# Patient Record
Sex: Female | Born: 1985 | Race: Black or African American | Hispanic: No | Marital: Single | Smoking: Current every day smoker
Health system: Southern US, Community
[De-identification: ages and names within clinical notes are randomized; demographics above are authoritative.]

## PROBLEM LIST (undated history)

## (undated) DIAGNOSIS — R55 Syncope and collapse: Secondary | ICD-10-CM

## (undated) DIAGNOSIS — I10 Essential (primary) hypertension: Secondary | ICD-10-CM

## (undated) DIAGNOSIS — F419 Anxiety disorder, unspecified: Secondary | ICD-10-CM

## (undated) DIAGNOSIS — R569 Unspecified convulsions: Secondary | ICD-10-CM

## (undated) DIAGNOSIS — F329 Major depressive disorder, single episode, unspecified: Secondary | ICD-10-CM

## (undated) DIAGNOSIS — F32A Depression, unspecified: Secondary | ICD-10-CM

## (undated) DIAGNOSIS — K859 Acute pancreatitis without necrosis or infection, unspecified: Secondary | ICD-10-CM

## (undated) DIAGNOSIS — E876 Hypokalemia: Secondary | ICD-10-CM

## (undated) HISTORY — DX: Major depressive disorder, single episode, unspecified: F32.9

## (undated) HISTORY — DX: Unspecified convulsions: R56.9

## (undated) HISTORY — DX: Hypokalemia: E87.6

## (undated) HISTORY — DX: Anxiety disorder, unspecified: F41.9

## (undated) HISTORY — DX: Syncope and collapse: R55

## (undated) HISTORY — DX: Essential (primary) hypertension: I10

## (undated) HISTORY — DX: Depression, unspecified: F32.A

---
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
## 2009-04-14 ENCOUNTER — Emergency Department (HOSPITAL_COMMUNITY): Admission: EM | Admit: 2009-04-14 | Discharge: 2009-04-14 | Payer: Self-pay | Admitting: Emergency Medicine

## 2010-03-03 ENCOUNTER — Emergency Department (HOSPITAL_COMMUNITY)
Admission: EM | Admit: 2010-03-03 | Discharge: 2010-03-03 | Payer: Self-pay | Source: Home / Self Care | Admitting: Emergency Medicine

## 2010-06-18 LAB — POCT I-STAT, CHEM 8
Calcium, Ion: 1.13 mmol/L (ref 1.12–1.32)
Chloride: 105 mEq/L (ref 96–112)
HCT: 42 % (ref 36.0–46.0)
Potassium: 3.7 mEq/L (ref 3.5–5.1)
Sodium: 140 mEq/L (ref 135–145)

## 2010-06-18 LAB — URINE MICROSCOPIC-ADD ON

## 2010-06-18 LAB — DIFFERENTIAL
Basophils Absolute: 0 10*3/uL (ref 0.0–0.1)
Lymphocytes Relative: 16 % (ref 12–46)
Monocytes Absolute: 0.5 10*3/uL (ref 0.1–1.0)
Neutro Abs: 4.1 10*3/uL (ref 1.7–7.7)

## 2010-06-18 LAB — CBC
Hemoglobin: 13.6 g/dL (ref 12.0–15.0)
RDW: 14.4 % (ref 11.5–15.5)

## 2010-06-18 LAB — D-DIMER, QUANTITATIVE: D-Dimer, Quant: <0.22 ug/mL-FEU (ref 0.00–0.48)

## 2010-06-18 LAB — URINALYSIS, ROUTINE W REFLEX MICROSCOPIC
Bilirubin Urine: NEGATIVE
Glucose, UA: NEGATIVE mg/dL
Hgb urine dipstick: NEGATIVE
Ketones, ur: NEGATIVE mg/dL
Protein, ur: 30 mg/dL — AB

## 2010-06-18 LAB — ETHANOL: Alcohol, Ethyl (B): 74 mg/dL — ABNORMAL HIGH (ref 0–10)

## 2010-09-02 ENCOUNTER — Inpatient Hospital Stay (HOSPITAL_COMMUNITY)
Admission: AD | Admit: 2010-09-02 | Discharge: 2010-09-02 | DRG: 780 | Disposition: A | Discharge status: Home / Self Care | Payer: 59 | Source: Ambulatory Visit | Attending: Obstetrics and Gynecology | Admitting: Obstetrics and Gynecology

## 2010-09-02 DIAGNOSIS — O479 False labor, unspecified: Principal | ICD-10-CM | POA: Diagnosis present

## 2010-09-02 LAB — CBC
Hemoglobin: 12.2 g/dL (ref 12.0–15.0)
MCH: 31 pg (ref 26.0–34.0)
RBC: 3.94 MIL/uL (ref 3.87–5.11)

## 2010-09-02 LAB — RPR: RPR Ser Ql: NONREACTIVE

## 2010-09-03 ENCOUNTER — Inpatient Hospital Stay (HOSPITAL_COMMUNITY)
Admission: AD | Admit: 2010-09-03 | Discharge: 2010-09-05 | DRG: 775 | Disposition: A | Discharge status: Home / Self Care | Payer: 59 | Source: Ambulatory Visit | Attending: Obstetrics and Gynecology | Admitting: Obstetrics and Gynecology

## 2010-09-03 DIAGNOSIS — O329XX0 Maternal care for malpresentation of fetus, unspecified, not applicable or unspecified: Secondary | ICD-10-CM | POA: Diagnosis present

## 2010-09-03 LAB — CBC
HCT: 36.9 % (ref 36.0–46.0)
Hemoglobin: 12.8 g/dL (ref 12.0–15.0)
RBC: 4.06 MIL/uL (ref 3.87–5.11)
WBC: 12.8 10*3/uL — ABNORMAL HIGH (ref 4.0–10.5)

## 2010-09-03 LAB — ABO/RH: ABO/RH(D): O POS

## 2010-09-04 LAB — CBC
MCH: 30.6 pg (ref 26.0–34.0)
MCHC: 33.2 g/dL (ref 30.0–36.0)
MCV: 92.1 fL (ref 78.0–100.0)
Platelets: 249 10*3/uL (ref 150–400)
RDW: 13.7 % (ref 11.5–15.5)
WBC: 14.6 10*3/uL — ABNORMAL HIGH (ref 4.0–10.5)

## 2013-12-11 LAB — COMPREHENSIVE METABOLIC PANEL
ALBUMIN: 3.8 g/dL (ref 3.4–5.0)
Alkaline Phosphatase: 65 U/L
Anion Gap: 13 (ref 7–16)
BUN: 8 mg/dL (ref 7–18)
Bilirubin,Total: 0.6 mg/dL (ref 0.2–1.0)
Calcium, Total: 8.7 mg/dL (ref 8.5–10.1)
Chloride: 104 mmol/L (ref 98–107)
Co2: 20 mmol/L — ABNORMAL LOW (ref 21–32)
Creatinine: 0.98 mg/dL (ref 0.60–1.30)
Glucose: 103 mg/dL — ABNORMAL HIGH (ref 65–99)
OSMOLALITY: 272 (ref 275–301)
Potassium: 3 mmol/L — ABNORMAL LOW (ref 3.5–5.1)
SGOT(AST): 40 U/L — ABNORMAL HIGH (ref 15–37)
SGPT (ALT): 23 U/L
Sodium: 137 mmol/L (ref 136–145)
Total Protein: 7.6 g/dL (ref 6.4–8.2)

## 2013-12-11 LAB — URINALYSIS, COMPLETE
BACTERIA: NONE SEEN
BILIRUBIN, UR: NEGATIVE
Glucose,UR: NEGATIVE mg/dL (ref 0–75)
NITRITE: NEGATIVE
PH: 7 (ref 4.5–8.0)
Protein: 100
RBC,UR: 4 /HPF (ref 0–5)
Specific Gravity: 1.027 (ref 1.003–1.030)
Squamous Epithelial: 9
WBC UR: 2 /HPF (ref 0–5)

## 2013-12-11 LAB — TROPONIN I

## 2013-12-11 LAB — CBC WITH DIFFERENTIAL/PLATELET
BASOS ABS: 0.1 10*3/uL (ref 0.0–0.1)
Basophil %: 0.6 %
EOS ABS: 0.1 10*3/uL (ref 0.0–0.7)
EOS PCT: 0.5 %
HCT: 37.9 % (ref 35.0–47.0)
HGB: 12.8 g/dL (ref 12.0–16.0)
Lymphocyte #: 1.2 10*3/uL (ref 1.0–3.6)
Lymphocyte %: 12.1 %
MCH: 34.5 pg — ABNORMAL HIGH (ref 26.0–34.0)
MCHC: 33.6 g/dL (ref 32.0–36.0)
MCV: 103 fL — AB (ref 80–100)
MONOS PCT: 7 %
Monocyte #: 0.7 x10 3/mm (ref 0.2–0.9)
NEUTROS ABS: 8.2 10*3/uL — AB (ref 1.4–6.5)
NEUTROS PCT: 79.8 %
Platelet: 352 10*3/uL (ref 150–440)
RBC: 3.7 10*6/uL — ABNORMAL LOW (ref 3.80–5.20)
RDW: 18.3 % — ABNORMAL HIGH (ref 11.5–14.5)
WBC: 10.3 10*3/uL (ref 3.6–11.0)

## 2013-12-11 LAB — LIPASE, BLOOD: LIPASE: 2719 U/L — AB (ref 73–393)

## 2013-12-12 ENCOUNTER — Inpatient Hospital Stay: Payer: Self-pay | Admitting: Internal Medicine

## 2013-12-13 LAB — CBC WITH DIFFERENTIAL/PLATELET
BASOS PCT: 0.6 %
Basophil #: 0 10*3/uL (ref 0.0–0.1)
EOS PCT: 4.9 %
Eosinophil #: 0.3 10*3/uL (ref 0.0–0.7)
HCT: 30 % — AB (ref 35.0–47.0)
HGB: 9.9 g/dL — AB (ref 12.0–16.0)
LYMPHS ABS: 1.5 10*3/uL (ref 1.0–3.6)
LYMPHS PCT: 26 %
MCH: 34.5 pg — ABNORMAL HIGH (ref 26.0–34.0)
MCHC: 33 g/dL (ref 32.0–36.0)
MCV: 104 fL — ABNORMAL HIGH (ref 80–100)
Monocyte #: 0.5 x10 3/mm (ref 0.2–0.9)
Monocyte %: 9.2 %
Neutrophil #: 3.4 10*3/uL (ref 1.4–6.5)
Neutrophil %: 59.3 %
Platelet: 235 10*3/uL (ref 150–440)
RBC: 2.88 10*6/uL — AB (ref 3.80–5.20)
RDW: 18.3 % — ABNORMAL HIGH (ref 11.5–14.5)
WBC: 5.7 10*3/uL (ref 3.6–11.0)

## 2013-12-13 LAB — BASIC METABOLIC PANEL
Anion Gap: 7 (ref 7–16)
BUN: 4 mg/dL — AB (ref 7–18)
CO2: 26 mmol/L (ref 21–32)
CREATININE: 0.84 mg/dL (ref 0.60–1.30)
Calcium, Total: 7.3 mg/dL — ABNORMAL LOW (ref 8.5–10.1)
Chloride: 105 mmol/L (ref 98–107)
EGFR (African American): >60
EGFR (Non-African Amer.): >60
Glucose: 75 mg/dL (ref 65–99)
OSMOLALITY: 271 (ref 275–301)
POTASSIUM: 3.2 mmol/L — AB (ref 3.5–5.1)
SODIUM: 138 mmol/L (ref 136–145)

## 2013-12-13 LAB — MAGNESIUM: Magnesium: 1.5 mg/dL — ABNORMAL LOW

## 2013-12-13 LAB — LIPASE, BLOOD: Lipase: 534 U/L — ABNORMAL HIGH (ref 73–393)

## 2014-07-24 NOTE — H&P (Signed)
PATIENT NAME:  Melissa Chapman, Melissa Chapman MR#:  [id number] DATE OF BIRTH:  14-Jun-1985  DATE OF ADMISSION:  12/12/2013  REFERRING PHYSICIAN: [place] SinkJade J. Dolores FrameSung, MD  PRIMARY CARE PHYSICIAN: None.   CHIEF COMPLAINT: Abdominal pain and vomiting.   HISTORY OF PRESENT ILLNESS: This is a 29 year old female with a significant past medical history of depression, presents with complaints of epigastric pain and vomiting over the last 24 hours. Reports she was at her usual state of health prior to that; only had complaints of diarrhea going on for 1 week, which has been resolved. Reports she has been having epigastric pain accompanied by nausea and vomiting and loss of appetite over the last 24 hours. Denies any chest pain, fevers, chills, bright red blood per rectum, coffee-ground emesis, dysuria, or polyuria. In the ED, the patient had basic workup done including lipase, which was significantly elevated at 2719. The patient admits to drinking beer on a daily basis, 2 to 3 beers per day. The patient had an ultrasound of the abdomen which did show a distended gallbladder but no findings of acute cholecystitis with suspicion of adenomyomatosis. Hospitalists were requested to admit the patient for treatment of her pancreatitis.   PAST MEDICAL HISTORY: Depression.   SOCIAL HISTORY: The patient works as a Engineer, civil (consulting)nurse. Drinks 2-3 beers per day. Smokes 1/2 pack of cigarettes per day. No illicit drug use.  FAMILY HISTORY: Significant for hypertension and diabetes.   ALLERGIES: No known drug allergies.   HOME MEDICATIONS: Zoloft 50 mg oral daily.   REVIEW OF SYSTEMS:  CONSTITUTIONAL: The patient denies fever, chills, fatigue, weakness, weight gain, weight loss.  EYES: Denies blurry vision, double vision, inflammation.  ENT: Denies tinnitus, ear pain, hearing loss, epistaxis.  RESPIRATORY: Denies cough, wheezing, hemoptysis, COPD.  CARDIOVASCULAR: Denies chest pain, edema, palpitations, syncope.  GASTROINTESTINAL: Reports nausea,  vomiting, abdominal pain, diarrhea. Denies hematemesis, melena, jaundice, rectal bleed.  GENITOURINARY: Denies dysuria, hematuria, or renal colic.  ENDOCRINE: Denies polyuria, polydipsia, heat or cold intolerance.  HEMATOLOGY: Denies anemia, easy bruising, bleeding diatheses.  INTEGUMENT: Denies acne, rash, or skin lesions.  MUSCULOSKELETAL: Denies any swelling, gout, cramps.  NEUROLOGIC: Denies CVA, TIA, tremors, vertigo, ataxia.  PSYCHIATRIC: Denies anxiety, insomnia, or depression.   PHYSICAL EXAMINATION: VITAL SIGNS: Temperature 99.2, pulse 92, respiratory rate 16, blood pressure 173/121, saturating 100% on room air.  GENERAL: Well-nourished female who looks comfortable in bed, in no apparent distress.  HEENT: Head atraumatic, normocephalic. Pupils equal, reactive to light. Pink conjunctivae. Anicteric sclerae. Moist oral mucosa.  NECK: Supple. No thyromegaly. No JVD. No carotid bruits.  CHEST: Good air entry bilaterally. No wheezing, rales, rhonchi. No use of accessory muscles.  CARDIOVASCULAR: S1, S2 heard. No rubs, murmur, or gallops. PMI nondisplaced.  ABDOMEN: Soft. Mild tenderness to palpation in the epigastric area. No rebound. No guarding. Bowel sounds present. No organomegaly appreciated.  EXTREMITIES: No edema. No clubbing. No cyanosis. Pedal pulses +2 bilaterally.  PSYCHIATRIC: Appropriate affect. Awake, alert x 3. Intact judgment and insight.  NEUROLOGIC: Cranial nerves grossly intact. Motor 5/5. No focal deficits. Sensation symmetrical and intact to light touch.  MUSCULOSKELETAL: No joint effusion or erythema.  SKIN: Normal skin turgor. Warm and dry.   PERTINENT LABORATORY DATA: Glucose 103, BUN 8, creatinine 0.98, sodium 137, potassium 3, chloride 104, CO2 is 20. Lipase 2719. ALT 23, AST 40, alkaline phosphatase 65. White blood cells 10.3, hemoglobin 12.8, hematocrit 37.9, platelets 352,000.   ASSESSMENT AND PLAN:  1.  Acute pancreatitis. This is alcohol-induced acute  pancreatitis.  The patient will be admitted and will be kept on aggressive intravenous fluid hydration. We will keep her n.p.o. for bowel rest. We will have her on as needed nausea and pain medicine. Once her vomiting subsides, we will start her on a clear liquid diet and advance as tolerated. The patient was counseled about alcohol abuse.  2.  Alcohol abuse. The patient will be started on [facility] Withdrawal [facility]  protocol.  3.  Hypokalemia secondary to vomiting. We will replace. We will monitor closely.  4.  Tobacco abuse. The patient was counseled. At this point, she does not want nicotine patch.  5.  Uncontrolled blood pressure. This is most likely related to her vomiting and pain. We will keep her on p.r.n. hydralazine as needed.  6.  Depression. Continue with Zoloft.  7.  Deep vein thrombosis prophylaxis. Subcutaneous heparin.   CODE STATUS: Full code.   TOTAL TIME SPENT ON ADMISSION AND PATIENT CARE: 50 minutes.    ____________________________ Starleen Arms, MD dse:ts D: 12/12/2013 02:42:21 ET T: 12/12/2013 04:44:39 ET JOB#: [id number]  cc: Starleen Arms, MD, <Dictator> Matia Zelada Teena Irani MD ELECTRONICALLY SIGNED 12/12/2013 23:48

## 2014-07-24 NOTE — Discharge Summary (Signed)
PATIENT NAME:  Melissa Chapman, Melissa Chapman MR#:  [id number] DATE OF BIRTH:  02-13-1986  DATE OF ADMISSION:  12/12/2013 DATE OF DISCHARGE:  12/13/2013  DISCHARGE DIAGNOSES:  1.  Acute pancreatitis due to alcohol.  2.  Alcohol abuse.  3.  Tobacco abuse.  4.  Macrocytic anemia.   CONSULTATIONS: None.   PROCEDURES: Abdominal ultrasound shows distended gallbladder. No acute cholecystitis. Normal caliber common bile duct. Normal liver.   BRIEF HISTORY AND PHYSICAL: This 29 year old female with significant past medical history of depression presents with complaint of epigastric pain and vomiting over the past 24 hours. She denies fevers, chills, hematemesis, or hematochezia. Emergency Room evaluation found lipase to be elevated at 2719. The patient drinks 2-3 beers per day. She is admitted for pancreatitis.   HOSPITAL COURSE BY PROBLEM:  1.  Pancreatitis due to alcohol: The patient was placed on bowel rest with IV hydration. Pain was controlled with IV morphine. Within 24 hours she had no pain. She tolerated several meals. Lipase decreased from the 2719 to 534.  2.  Alcohol abuse: Likely causing acute pancreatitis. She showed no signs of withdrawal during this admission. She was on the CIWA protocol. We discussed alcohol cessation. The patient feels that she will be able to quit drinking. She was advised to find a local AA program.  3.  Tobacco abuse: Cessation counseling provided inpatient. She should ask your primary care provider for a prescription for nicotine patches.  4.  Macrocytic anemia: This is likely due to a combination of heavy alcohol abuse and iron loss through menstruation. This should be followed up in the outpatient setting.   DISCHARGE PHYSICAL EXAMINATION: VITAL SIGNS: Temperature 98, pulse 98, respirations 16, blood pressure 155/103, oxygen saturation 95% on room air.  GENERAL: No acute distress.  RESPIRATORY: Lungs are clear to auscultation bilaterally with good air movement.   CARDIOVASCULAR: Regular rate and rhythm. No murmurs, rubs, or gallops.  ABDOMEN: Soft, nontender. Bowel sounds are normal. No guarding, no rebound, no hepatosplenomegaly.  EXTREMITIES: No edema, 2+ peripheral pulses.  NEUROLOGIC: Cranial nerves II to XII are intact. Strength and sensation are normal.  PSYCHIATRIC: The patient is calm, alert, oriented with good insight.   LABORATORY DATA: Sodium 134, potassium 3.2 (this was repleted with 40 mEq of potassium before discharge), chloride 105, bicarbonate 26, BUN 4, creatinine at 0.84. Lipase 534. LFTs normal. Hemoglobin 9.9, white blood cells 5.7, platelets 235,000, MCV 104.   DISCHARGE MEDICATIONS: Zoloft 50 mg 1 tablet daily.   CONDITION ON DISCHARGE: Stable.   DISPOSITION: She is discharged to home with no home health needs.   DISCHARGE INSTRUCTIONS: Please find a primary care provider and follow up within 1-2 weeks of discharge.   DIET: Advance as tolerated.   ACTIVITY: As tolerated.   TIME SPENT ON DISCHARGE: 35 minutes.    ____________________________ Ena Dawleyatherine P. Clent RidgesWalsh, MD cpw:ts D: 12/13/2013 15:12:28 ET T: 12/13/2013 17:35:00 ET JOB#: [id number]  cc: Santina Evansatherine P. Clent RidgesWalsh, MD, <Dictator> Gale JourneyATHERINE P Cristian Davitt MD ELECTRONICALLY SIGNED 12/16/2013 11:15

## 2015-11-11 ENCOUNTER — Encounter (HOSPITAL_COMMUNITY): Payer: Self-pay | Admitting: Emergency Medicine

## 2015-11-11 ENCOUNTER — Emergency Department (HOSPITAL_COMMUNITY)
Admission: EM | Admit: 2015-11-11 | Discharge: 2015-11-12 | Disposition: A | Discharge status: Home / Self Care | Payer: 59 | Attending: Emergency Medicine | Admitting: Emergency Medicine

## 2015-11-11 DIAGNOSIS — K859 Acute pancreatitis without necrosis or infection, unspecified: Secondary | ICD-10-CM | POA: Insufficient documentation

## 2015-11-11 DIAGNOSIS — Z791 Long term (current) use of non-steroidal anti-inflammatories (NSAID): Secondary | ICD-10-CM | POA: Insufficient documentation

## 2015-11-11 HISTORY — DX: Acute pancreatitis without necrosis or infection, unspecified: K85.90

## 2015-11-11 MED ORDER — ONDANSETRON HCL 4 MG/2ML IJ SOLN
4.0000 mg | Freq: Once | INTRAMUSCULAR | Status: AC
  Administered 2015-11-11: 4 mg via INTRAVENOUS
  Filled 2015-11-11: qty 2

## 2015-11-11 MED ORDER — HYDROMORPHONE HCL 1 MG/ML IJ SOLN
1.0000 mg | Freq: Once | INTRAMUSCULAR | Status: AC
  Administered 2015-11-11: 1 mg via INTRAVENOUS
  Filled 2015-11-11: qty 1

## 2015-11-11 MED ORDER — SODIUM CHLORIDE 0.9 % IV BOLUS (SEPSIS)
2000.0000 mL | Freq: Once | INTRAVENOUS | Status: AC
  Administered 2015-11-11: 2000 mL via INTRAVENOUS

## 2015-11-11 NOTE — ED Provider Notes (Signed)
[facility]-EMERGENCY DEPT Provider Note   CSN: [id number] Arrival date & time: 11/11/15  2302  First Provider Contact:  First MD Initiated Contact with Patient 11/11/15 2326        History   Chief Complaint Chief Complaint  Patient presents with  . Abdominal Pain    HPI Melissa Chapman is a 30 y.o. female.  Patient with a history of pancreatitis, recent alcohol use, presents with 2 day onset of progressively worsening Left upper quadrant abdominal pain that wraps around to the back, between shoulder blades. She started having nausea and vomiting yesterday. She reports a history of pancreatitis and continued alcohol use, last use 3 days ago. Not currently intoxicated   The history is provided by the patient. No language interpreter was used.  Abdominal Pain   This is a new problem. The current episode started 2 days ago. The problem occurs constantly. The problem has been gradually worsening. The pain is associated with alcohol use. The pain is located in the LUQ. Associated symptoms include nausea and vomiting. Pertinent negatives include fever and dysuria.    Past Medical History:  Diagnosis Date  . Pancreatitis     There are no active problems to display for this patient.   History reviewed. No pertinent surgical history.  OB History    No data available       Home Medications    Prior to Admission medications   Not on File    Family History No family history on file.  Social History Social History  Substance Use Topics  . Smoking status: Never Smoker  . Smokeless tobacco: Never Used  . Alcohol use Yes     Allergies   Review of patient's allergies indicates no known allergies.   Review of Systems Review of Systems  Constitutional: Negative for chills and fever.  Respiratory: Negative.  Negative for shortness of breath.   Cardiovascular: Negative.  Negative for chest pain.  Gastrointestinal: Positive for abdominal pain, nausea and vomiting.    Genitourinary: Negative.  Negative for dysuria, flank pain and vaginal discharge.  Musculoskeletal: Positive for back pain.  Skin: Negative.   Neurological: Negative.      Physical Exam Updated Vital Signs BP (!) 160/117 (BP Location: Left Arm)   Pulse (!) 143   Temp 98 F (36.7 C) (Oral)   Resp 18   SpO2 99%   Physical Exam  Constitutional: She is oriented to person, place, and time. She appears well-developed and well-nourished.  Uncomfortable appearing.  HENT:  Head: Normocephalic.  Neck: Normal range of motion. Neck supple.  Cardiovascular: Regular rhythm.  Tachycardia present.   Pulmonary/Chest: Effort normal and breath sounds normal.  Abdominal: Soft. Bowel sounds are normal. There is tenderness (epigastric and LUQ tenderness with guarding.). There is guarding. There is no rebound.  Musculoskeletal: Normal range of motion.  Neurological: She is alert and oriented to person, place, and time.  Skin: Skin is warm and dry. No rash noted.  Psychiatric: She has a normal mood and affect.     ED Treatments / Results  Labs (all labs ordered are listed, but only abnormal results are displayed) Labs Reviewed  CBC WITH DIFFERENTIAL/PLATELET  LIPASE, BLOOD  COMPREHENSIVE METABOLIC PANEL  PREGNANCY, URINE  URINALYSIS, ROUTINE W REFLEX MICROSCOPIC (NOT AT [facility])   Results for orders placed or performed during the hospital encounter of 11/11/15  CBC with Differential  Result Value Ref Range   WBC 9.7 4.0 - 10.5 K/uL   RBC 3.47 (L) 3.87 -  5.11 MIL/uL   Hemoglobin 12.3 12.0 - 15.0 g/dL   HCT 16.1 (L) 09.6 - 04.5 %   MCV 100.0 78.0 - 100.0 fL   MCH 35.4 (H) 26.0 - 34.0 pg   MCHC 35.4 30.0 - 36.0 g/dL   RDW 40.9 (H) 81.1 - 91.4 %   Platelets 228 150 - 400 K/uL   Neutrophils Relative % 82 %   Neutro Abs 7.9 (H) 1.7 - 7.7 K/uL   Lymphocytes Relative 12 %   Lymphs Abs 1.2 0.7 - 4.0 K/uL   Monocytes Relative 6 %   Monocytes Absolute 0.6 0.1 - 1.0 K/uL   Eosinophils Relative  0 %   Eosinophils Absolute 0.0 0.0 - 0.7 K/uL   Basophils Relative 0 %   Basophils Absolute 0.0 0.0 - 0.1 K/uL  Lipase, blood  Result Value Ref Range   Lipase 262 (H) 11 - 51 U/L  Comprehensive metabolic panel  Result Value Ref Range   Sodium 138 135 - 145 mmol/L   Potassium 3.0 (L) 3.5 - 5.1 mmol/L   Chloride 101 101 - 111 mmol/L   CO2 24 22 - 32 mmol/L   Glucose, Bld 174 (H) 65 - 99 mg/dL   BUN 9 6 - 20 mg/dL   Creatinine, Ser 7.82 0.44 - 1.00 mg/dL   Calcium 8.6 (L) 8.9 - 10.3 mg/dL   Total Protein 7.7 6.5 - 8.1 g/dL   Albumin 3.7 3.5 - 5.0 g/dL   AST 41 15 - 41 U/L   ALT 21 14 - 54 U/L   Alkaline Phosphatase 93 38 - 126 U/L   Total Bilirubin 2.5 (H) 0.3 - 1.2 mg/dL   GFR calc non Af Amer >60 >60 mL/min   GFR calc Af Amer >60 >60 mL/min   Anion gap 13 5 - 15  Pregnancy, urine  Result Value Ref Range   Preg Test, Ur NEGATIVE NEGATIVE  Urinalysis, Routine w reflex microscopic  Result Value Ref Range   Color, Urine ORANGE (A) YELLOW   APPearance CLOUDY (A) CLEAR   Specific Gravity, Urine 1.026 1.005 - 1.030   pH 6.0 5.0 - 8.0   Glucose, UA NEGATIVE NEGATIVE mg/dL   Hgb urine dipstick SMALL (A) NEGATIVE   Bilirubin Urine SMALL (A) NEGATIVE   Ketones, ur 15 (A) NEGATIVE mg/dL   Protein, ur 956 (A) NEGATIVE mg/dL   Nitrite NEGATIVE NEGATIVE   Leukocytes, UA SMALL (A) NEGATIVE  Urine microscopic-add on  Result Value Ref Range   Squamous Epithelial / LPF 6-30 (A) NONE SEEN   WBC, UA 6-30 0 - 5 WBC/hpf   RBC / HPF 6-30 0 - 5 RBC/hpf   Bacteria, UA FEW (A) NONE SEEN   Trichomonas, UA PRESENT    Urine-Other MUCOUS PRESENT     EKG  EKG Interpretation None       Radiology No results found.  Procedures Procedures (including critical care time)  Medications Ordered in ED Medications  sodium chloride 0.9 % bolus 2,000 mL (not administered)     Initial Impression / Assessment and Plan / ED Course  I have reviewed the triage vital signs and the nursing  notes.  Pertinent labs & imaging results that were available during my care of the patient were reviewed by me and considered in my medical decision making (see chart for details).  Clinical Course    Patient presents with LUQ abdominal pain similar to previous pancreatitis. No fever. She admits to alcohol use 2 days ago. Pain  is controlled and she is tolerating PO fluids without vomiting. She appears more comfortable.   Labs show a mildly elevated lipase of 262. Elevated total bilirubin without other liver abnormalities. No fever. Tachycardia improved with fluids. She is stable for discharge home. Return precautions discussed.   Final Clinical Impressions(s) / ED Diagnoses   Final diagnoses:  None   1. Pancreatitis, mild 2. Hyperbilirubinemia.  New Prescriptions New Prescriptions   No medications on file     Elpidio Anis, PA-C 11/12/15 0438    Mancel Bale, MD 11/12/15 [phone]

## 2015-11-11 NOTE — ED Triage Notes (Signed)
Pt presents with LUQ pain and pain to L flank x 2 days, n/v onset yesterday. Denies fever or diarrhea. Pt states she has had similar pain 2958yrs ago with pancreatitis.

## 2015-11-11 NOTE — ED Notes (Signed)
Pt is aware of the need for urine. 

## 2015-11-12 LAB — COMPREHENSIVE METABOLIC PANEL
ALBUMIN: 3.7 g/dL (ref 3.5–5.0)
ALK PHOS: 93 U/L (ref 38–126)
ALT: 21 U/L (ref 14–54)
AST: 41 U/L (ref 15–41)
Anion gap: 13 (ref 5–15)
BUN: 9 mg/dL (ref 6–20)
CALCIUM: 8.6 mg/dL — AB (ref 8.9–10.3)
CO2: 24 mmol/L (ref 22–32)
CREATININE: 1 mg/dL (ref 0.44–1.00)
Chloride: 101 mmol/L (ref 101–111)
GFR calc Af Amer: >60 mL/min (ref >60)
GFR calc non Af Amer: >60 mL/min (ref >60)
GLUCOSE: 174 mg/dL — AB (ref 65–99)
Potassium: 3 mmol/L — ABNORMAL LOW (ref 3.5–5.1)
SODIUM: 138 mmol/L (ref 135–145)
Total Bilirubin: 2.5 mg/dL — ABNORMAL HIGH (ref 0.3–1.2)
Total Protein: 7.7 g/dL (ref 6.5–8.1)

## 2015-11-12 LAB — URINALYSIS, ROUTINE W REFLEX MICROSCOPIC
GLUCOSE, UA: NEGATIVE mg/dL
Ketones, ur: 15 mg/dL — AB
Nitrite: NEGATIVE
PH: 6 (ref 5.0–8.0)
Protein, ur: 100 mg/dL — AB
SPECIFIC GRAVITY, URINE: 1.026 (ref 1.005–1.030)

## 2015-11-12 LAB — PREGNANCY, URINE: Preg Test, Ur: NEGATIVE

## 2015-11-12 LAB — CBC WITH DIFFERENTIAL/PLATELET
BASOS ABS: 0 10*3/uL (ref 0.0–0.1)
Basophils Relative: 0 %
Eosinophils Absolute: 0 10*3/uL (ref 0.0–0.7)
Eosinophils Relative: 0 %
HEMATOCRIT: 34.7 % — AB (ref 36.0–46.0)
Hemoglobin: 12.3 g/dL (ref 12.0–15.0)
LYMPHS ABS: 1.2 10*3/uL (ref 0.7–4.0)
LYMPHS PCT: 12 %
MCH: 35.4 pg — ABNORMAL HIGH (ref 26.0–34.0)
MCHC: 35.4 g/dL (ref 30.0–36.0)
MCV: 100 fL (ref 78.0–100.0)
MONO ABS: 0.6 10*3/uL (ref 0.1–1.0)
Monocytes Relative: 6 %
NEUTROS ABS: 7.9 10*3/uL — AB (ref 1.7–7.7)
Neutrophils Relative %: 82 %
Platelets: 228 10*3/uL (ref 150–400)
RBC: 3.47 MIL/uL — ABNORMAL LOW (ref 3.87–5.11)
RDW: 16.6 % — ABNORMAL HIGH (ref 11.5–15.5)
WBC: 9.7 10*3/uL (ref 4.0–10.5)

## 2015-11-12 LAB — URINE MICROSCOPIC-ADD ON

## 2015-11-12 LAB — LIPASE, BLOOD: Lipase: 262 U/L — ABNORMAL HIGH (ref 11–51)

## 2015-11-12 MED ORDER — HYDROMORPHONE HCL 1 MG/ML IJ SOLN
1.0000 mg | Freq: Once | INTRAMUSCULAR | Status: AC
  Administered 2015-11-12: 1 mg via INTRAVENOUS
  Filled 2015-11-12: qty 1

## 2015-11-12 MED ORDER — SODIUM CHLORIDE 0.9 % IV BOLUS (SEPSIS)
1000.0000 mL | Freq: Once | INTRAVENOUS | Status: AC
  Administered 2015-11-12: 1000 mL via INTRAVENOUS

## 2015-11-12 MED ORDER — OXYCODONE-ACETAMINOPHEN 5-325 MG PO TABS: 1.0000 | ORAL_TABLET | ORAL | 0 refills | Status: DC | PRN

## 2015-11-12 MED ORDER — HYDROMORPHONE HCL 1 MG/ML IJ SOLN
0.5000 mg | Freq: Once | INTRAMUSCULAR | Status: AC
Start: 2015-11-12 — End: 2015-11-12
  Administered 2015-11-12: 0.5 mg via INTRAVENOUS
  Filled 2015-11-12: qty 1

## 2015-11-12 MED ORDER — KETOROLAC TROMETHAMINE 30 MG/ML IJ SOLN
30.0000 mg | Freq: Once | INTRAMUSCULAR | Status: AC
  Administered 2015-11-12: 30 mg via INTRAVENOUS
  Filled 2015-11-12: qty 1

## 2015-11-12 MED ORDER — POTASSIUM CHLORIDE 10 MEQ/100ML IV SOLN
10.0000 meq | Freq: Once | INTRAVENOUS | Status: AC
  Administered 2015-11-12: 10 meq via INTRAVENOUS
  Filled 2015-11-12: qty 100

## 2015-11-12 MED ORDER — ONDANSETRON 8 MG PO TBDP: 8.0000 mg | ORAL_TABLET | Freq: Three times a day (TID) | ORAL | 0 refills | Status: DC | PRN

## 2015-11-12 MED ORDER — OXYCODONE-ACETAMINOPHEN 5-325 MG PO TABS
2.0000 | ORAL_TABLET | Freq: Once | ORAL | Status: AC
Start: 2015-11-12 — End: 2015-11-12
  Administered 2015-11-12: 2 via ORAL
  Filled 2015-11-12: qty 2

## 2015-11-12 NOTE — ED Notes (Signed)
Patient d/c'd self care.  F/U and medication discussed.  Patient verbalized understanding. 

## 2015-11-12 NOTE — ED Notes (Addendum)
Pt. Ambulated down the hall and back to her room, pt. 98% room air and heart rate 138. Pt. Gait steady on her feet. PA,Shari made aware of elevated heart rate.

## 2015-11-12 NOTE — Discharge Instructions (Signed)
RETURN TO THE EMERGENCY DEPARTMENT WITH ANY UNCONTROLLED SEVERE PAIN OR VOMITING, FEVER OR NEW CONCERN.

## 2016-09-12 MED FILL — AMLODIPINE BESYLATE 10 MG T: 10 | 90 days supply | Qty: 90 | Fill #0

## 2016-10-22 ENCOUNTER — Ambulatory Visit (INDEPENDENT_AMBULATORY_CARE_PROVIDER_SITE_OTHER): Payer: 59 | Admitting: Primary Care

## 2016-10-22 ENCOUNTER — Encounter: Payer: Self-pay | Admitting: Primary Care

## 2016-10-22 VITALS — BP 144/100 | HR 108 | Temp 98.6°F | Ht 67.0 in | Wt 158.8 lb

## 2016-10-22 DIAGNOSIS — I10 Essential (primary) hypertension: Secondary | ICD-10-CM | POA: Insufficient documentation

## 2016-10-22 DIAGNOSIS — F419 Anxiety disorder, unspecified: Secondary | ICD-10-CM | POA: Diagnosis not present

## 2016-10-22 DIAGNOSIS — F329 Major depressive disorder, single episode, unspecified: Secondary | ICD-10-CM | POA: Diagnosis not present

## 2016-10-22 MED ORDER — HYDROCHLOROTHIAZIDE 25 MG PO TABS: 25.0000 mg | ORAL_TABLET | Freq: Every day | ORAL | 0 refills | Status: DC

## 2016-10-22 MED ORDER — ESCITALOPRAM OXALATE 10 MG PO TABS: 10.0000 mg | ORAL_TABLET | Freq: Every day | ORAL | 1 refills | Status: DC

## 2016-10-22 MED FILL — HYDROCHLOROTHIAZIDE 25 MG T: 25 | 30 days supply | Qty: 30 | Fill #0

## 2016-10-22 MED FILL — ESCITALOPRAM 10 MG TABLET: 10 | 30 days supply | Qty: 30 | Fill #0

## 2016-10-22 NOTE — Assessment & Plan Note (Signed)
GAD 7 score of 13 and PHQ 9 score of 12 today. Denies SI/HI. Failed treatment on Zoloft. Discussed options for treatment and will initiate Lexapro 10 mg.   Patient is to take 1/2 tablet daily for 8 days, then advance to 1 full tablet thereafter. We discussed possible side effects of headache, GI upset, drowsiness, and SI/HI. If thoughts of SI/HI develop, we discussed to present to the emergency immediately. Patient verbalized understanding.   Follow up in 6 weeks for re-evaluation.

## 2016-10-22 NOTE — Assessment & Plan Note (Signed)
Above goal today, even on recheck. Above goal with home readings. Add HCTZ 25 mg, continue Amlodipine 10 mg. Will have her monitor BP for the next 2 weeks, follow up in 2 weeks with home readings. BMP in 2 weeks.

## 2016-10-22 NOTE — Progress Notes (Signed)
Subjective:    Patient ID: Melissa Chapman, female    DOB: [birth year], 31 y.o.   MRN: [id number]  HPI  Melissa Chapman is a 31 year old female who presents today to establish care and discuss the problems mentioned below. Will obtain old records.  1) Essential Hypertension: Currently managed on amlodipine 10 mg. Her BP in the office today is 158/102. She checked her BP three weeks ago which was 145/90. She is compliant to her Amlodipine.   2) Depression: Diagnosed three years ago, symptoms present much longer. Previously managed on Zoloft 50 mg which she took for 6 months, last took this in August 2017. She didn't feel like the Zoloft helped at all. She's experiencing intermittent symptoms of anxiety, lack of motivation, worry. She would like to try something else for anxiety and depression. GAD 7 score of 13 and PHQ 9 score 12 today. She denies SI/HI.  Review of Systems  Constitutional: Positive for fatigue.  Eyes: Negative for visual disturbance.  Respiratory: Negative for shortness of breath.   Cardiovascular: Negative for chest pain.  Neurological: Negative for dizziness and headaches.  Psychiatric/Behavioral:       See HPI       Past Medical History:  Diagnosis Date  . Anxiety and depression   . Essential hypertension   . Pancreatitis      Social History   Social History  . Marital status: Single    Spouse name: N/A  . Number of children: N/A  . Years of education: N/A   Occupational History  . Not on file.   Social History Main Topics  . Smoking status: Never Smoker  . Smokeless tobacco: Never Used  . Alcohol use Yes  . Drug use: No  . Sexual activity: Not on file   Other Topics Concern  . Not on file   Social History Narrative   Single.    1 son.   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    No past surgical history on file.  Family History  Problem Relation Age of Onset  . Depression Mother   . Hypertension Mother   .  Hypertension Father   . Diabetes Father   . Depression Sister   . Cancer Paternal Grandmother        Sinus     No Known Allergies  Current Outpatient Prescriptions on File Prior to Visit  Medication Sig Dispense Refill  . B Complex-C (B-COMPLEX WITH VITAMIN C) tablet Take 1 tablet by mouth daily.    . Biotin 1000 MCG tablet Take 1,000 mcg by mouth daily.    Marland Kitchen. ibuprofen (ADVIL,MOTRIN) 200 MG tablet Take 200-400 mg by mouth every 6 (six) hours as needed (for pain.).    Marland Kitchen. Melatonin 1 MG TABS Take 1-3 mg by mouth at bedtime.     No current facility-administered medications on file prior to visit.     BP (!) 144/100   Pulse (!) 108   Temp 98.6 F (37 C) (Oral)   Ht 5\' 7"  (1.702 m)   Wt 158 lb 12.8 oz (72 kg)   SpO2 98%   BMI 24.87 kg/m    Objective:   Physical Exam  Constitutional: She appears well-nourished.  Neck: Neck supple.  Cardiovascular: Normal rate and regular rhythm.   Pulmonary/Chest: Effort normal and breath sounds normal.  Skin: Skin is warm and dry.  Psychiatric: She has a normal mood and affect.  Assessment & Plan:

## 2016-10-22 NOTE — Patient Instructions (Signed)
Start hydrochlorothiazide 25 mg tablets for high blood pressure. Take 1 tablet by mouth once daily.  Continue Amlodipine 10 mg tablets for high blood pressure.   Check your blood pressure daily, around the same time of day, for the next 2 weeks.  Ensure that you have rested for 30 minutes prior to checking your blood pressure. Record your readings and bring them to your next visit.  Start Lexapro 10 mg tablets. Start by taking 1/2 tablet daily for 8 days, then increase to 1 full tablet thereafter.   Schedule a follow up visit for blood pressure check in 2 weeks.  Schedule a follow up visit for anxiety and depression in 6 weeks.  It was a pleasure to meet you today! Please don't hesitate to call me with any questions. Welcome [facility]!

## 2016-11-05 ENCOUNTER — Encounter: Payer: Self-pay | Admitting: Primary Care

## 2016-11-05 ENCOUNTER — Ambulatory Visit (INDEPENDENT_AMBULATORY_CARE_PROVIDER_SITE_OTHER): Payer: 59 | Admitting: Primary Care

## 2016-11-05 DIAGNOSIS — I10 Essential (primary) hypertension: Secondary | ICD-10-CM

## 2016-11-05 MED ORDER — AMLODIPINE BESYLATE 10 MG PO TABS: 10.0000 mg | ORAL_TABLET | Freq: Every day | ORAL | 2 refills | Status: DC

## 2016-11-05 MED ORDER — HYDROCHLOROTHIAZIDE 25 MG PO TABS: 25.0000 mg | ORAL_TABLET | Freq: Every day | ORAL | 2 refills | Status: DC

## 2016-11-05 NOTE — Progress Notes (Signed)
   Subjective:    Patient ID: Melissa Chapman, female    DOB: 08/28/1985, 31 y.o.   MRN: [id number]  HPI  Melissa Chapman is a 31 year old female who presents today for follow up of hypertension. Currently managed on Amlodipine 10 mg and HCTZ 25 mg that as added during her last visit given persistent elevated readings.   Her BP in the office today is 124/82. She's checking her BP at home and is getting readings of 120's-low 130's/70's-80's. She denies chest pain, dizziness, ankle edema, visual changes. She is needing refills today.  Review of Systems  Constitutional: Negative for fatigue.  Eyes: Negative for visual disturbance.  Respiratory: Negative for shortness of breath.   Cardiovascular: Negative for chest pain and leg swelling.  Neurological: Negative for headaches.       Past Medical History:  Diagnosis Date  . Anxiety and depression   . Essential hypertension   . Pancreatitis      Social History   Social History  . Marital status: Single    Spouse name: N/A  . Number of children: N/A  . Years of education: N/A   Occupational History  . Not on file.   Social History Main Topics  . Smoking status: Never Smoker  . Smokeless tobacco: Never Used  . Alcohol use Yes  . Drug use: No  . Sexual activity: Not on file   Other Topics Concern  . Not on file   Social History Narrative   Single.    1 son.   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    No past surgical history on file.  Family History  Problem Relation Age of Onset  . Depression Mother   . Hypertension Mother   . Hypertension Father   . Diabetes Father   . Depression Sister   . Cancer Paternal Grandmother        Sinus     No Known Allergies  Current Outpatient Prescriptions on File Prior to Visit  Medication Sig Dispense Refill  . B Complex-C (B-COMPLEX WITH VITAMIN C) tablet Take 1 tablet by mouth daily.    . Biotin 1000 MCG tablet Take 1,000 mcg by mouth daily.      Marland Kitchen. escitalopram (LEXAPRO) 10 MG tablet Take 1 tablet (10 mg total) by mouth daily. 30 tablet 1  . ibuprofen (ADVIL,MOTRIN) 200 MG tablet Take 200-400 mg by mouth every 6 (six) hours as needed (for pain.).    Marland Kitchen. Melatonin 1 MG TABS Take 1-3 mg by mouth at bedtime.     No current facility-administered medications on file prior to visit.     BP 124/82   Pulse (!) 104   Temp 98.1 F (36.7 C) (Oral)   Ht 5\' 7"  (1.702 m)   Wt 158 lb 12.8 oz (72 kg)   SpO2 98%   BMI 24.87 kg/m    Objective:   Physical Exam  Constitutional: She appears well-nourished.  Neck: Neck supple.  Cardiovascular: Normal rate and regular rhythm.   Pulmonary/Chest: Effort normal and breath sounds normal.  Skin: Skin is warm and dry.          Assessment & Plan:

## 2016-11-05 NOTE — Patient Instructions (Signed)
Complete lab work prior to leaving today. I will notify you of your results once received.   Continue Amlodipine 10 mg and Hydrochlorothiazide 25 mg for blood pressure. I sent refills to your pharmacy.  We will see you in about 1 month. It was a pleasure to see you today!   DASH Eating Plan DASH stands for "Dietary Approaches to Stop Hypertension." The DASH eating plan is a healthy eating plan that has been shown to reduce high blood pressure (hypertension). It may also reduce your risk for type 2 diabetes, heart disease, and stroke. The DASH eating plan may also help with weight loss. What are tips for following this plan? General guidelines  Avoid eating more than 2,300 mg (milligrams) of salt (sodium) a day. If you have hypertension, you may need to reduce your sodium intake to 1,500 mg a day.  Limit alcohol intake to no more than 1 drink a day for nonpregnant women and 2 drinks a day for men. One drink equals 12 oz of beer, 5 oz of wine, or 1 oz of hard liquor.  Work with your health care provider to maintain a healthy body weight or to lose weight. Ask what an ideal weight is for you.  Get at least 30 minutes of exercise that causes your heart to beat faster (aerobic exercise) most days of the week. Activities may include walking, swimming, or biking.  Work with your health care provider or diet and nutrition specialist (dietitian) to adjust your eating plan to your individual calorie needs. Reading food labels  Check food labels for the amount of sodium per serving. Choose foods with less than 5 percent of the Daily Value of sodium. Generally, foods with less than 300 mg of sodium per serving fit into this eating plan.  To find whole grains, look for the word "whole" as the first word in the ingredient list. Shopping  Buy products labeled as "low-sodium" or "no salt added."  Buy fresh foods. Avoid canned foods and premade or frozen meals. Cooking  Avoid adding salt when  cooking. Use salt-free seasonings or herbs instead of table salt or sea salt. Check with your health care provider or pharmacist before using salt substitutes.  Do not fry foods. Cook foods using healthy methods such as baking, boiling, grilling, and broiling instead.  Cook with heart-healthy oils, such as olive, canola, soybean, or sunflower oil. Meal planning   Eat a balanced diet that includes: ? 5 or more servings of fruits and vegetables each day. At each meal, try to fill half of your plate with fruits and vegetables. ? Up to 6-8 servings of whole grains each day. ? Less than 6 oz of lean meat, poultry, or fish each day. A 3-oz serving of meat is about the same size as a deck of cards. One egg equals 1 oz. ? 2 servings of low-fat dairy each day. ? A serving of nuts, seeds, or beans 5 times each week. ? Heart-healthy fats. Healthy fats called Omega-3 fatty acids are found in foods such as flaxseeds and coldwater fish, like sardines, salmon, and mackerel.  Limit how much you eat of the following: ? Canned or prepackaged foods. ? Food that is high in trans fat, such as fried foods. ? Food that is high in saturated fat, such as fatty meat. ? Sweets, desserts, sugary drinks, and other foods with added sugar. ? Full-fat dairy products.  Do not salt foods before eating.  Try to eat at least 2 vegetarian  meals each week.  Eat more home-cooked food and less restaurant, buffet, and fast food.  When eating at a restaurant, ask that your food be prepared with less salt or no salt, if possible. What foods are recommended? The items listed may not be a complete list. Talk with your dietitian about what dietary choices are best for you. Grains Whole-grain or whole-wheat bread. Whole-grain or whole-wheat pasta. Brown rice. Modena Morrow. Bulgur. Whole-grain and low-sodium cereals. Pita bread. Low-fat, low-sodium crackers. Whole-wheat flour tortillas. Vegetables Fresh or frozen vegetables  (raw, steamed, roasted, or grilled). Low-sodium or reduced-sodium tomato and vegetable juice. Low-sodium or reduced-sodium tomato sauce and tomato paste. Low-sodium or reduced-sodium canned vegetables. Fruits All fresh, dried, or frozen fruit. Canned fruit in natural juice (without added sugar). Meat and other protein foods Skinless chicken or [place]. Ground chicken or [place]. Pork with fat trimmed off. Fish and seafood. Egg whites. Dried beans, peas, or lentils. Unsalted nuts, nut butters, and seeds. Unsalted canned beans. Lean cuts of beef with fat trimmed off. Low-sodium, lean deli meat. Dairy Low-fat (1%) or fat-free (skim) milk. Fat-free, low-fat, or reduced-fat cheeses. Nonfat, low-sodium ricotta or cottage cheese. Low-fat or nonfat yogurt. Low-fat, low-sodium cheese. Fats and oils Soft margarine without trans fats. Vegetable oil. Low-fat, reduced-fat, or light mayonnaise and salad dressings (reduced-sodium). Canola, safflower, olive, soybean, and sunflower oils. Avocado. Seasoning and other foods Herbs. Spices. Seasoning mixes without salt. Unsalted popcorn and pretzels. Fat-free sweets. What foods are not recommended? The items listed may not be a complete list. Talk with your dietitian about what dietary choices are best for you. Grains Baked goods made with fat, such as croissants, muffins, or some breads. Dry pasta or rice meal packs. Vegetables Creamed or fried vegetables. Vegetables in a cheese sauce. Regular canned vegetables (not low-sodium or reduced-sodium). Regular canned tomato sauce and paste (not low-sodium or reduced-sodium). Regular tomato and vegetable juice (not low-sodium or reduced-sodium). Angie Fava. Olives. Fruits Canned fruit in a light or heavy syrup. Fried fruit. Fruit in cream or butter sauce. Meat and other protein foods Fatty cuts of meat. Ribs. Fried meat. Berniece Salines. Sausage. Bologna and other processed lunch meats. Salami. Fatback. Hotdogs. Bratwurst. Salted nuts  and seeds. Canned beans with added salt. Canned or smoked fish. Whole eggs or egg yolks. Chicken or [place] with skin. Dairy Whole or 2% milk, cream, and half-and-half. Whole or full-fat cream cheese. Whole-fat or sweetened yogurt. Full-fat cheese. Nondairy creamers. Whipped toppings. Processed cheese and cheese spreads. Fats and oils Butter. Stick margarine. Lard. Shortening. Ghee. Bacon fat. Tropical oils, such as coconut, palm kernel, or palm oil. Seasoning and other foods Salted popcorn and pretzels. Onion salt, garlic salt, seasoned salt, table salt, and sea salt. Worcestershire sauce. Tartar sauce. Barbecue sauce. Teriyaki sauce. Soy sauce, including reduced-sodium. Steak sauce. Canned and packaged gravies. Fish sauce. Oyster sauce. Cocktail sauce. Horseradish that you find on the shelf. Ketchup. Mustard. Meat flavorings and tenderizers. Bouillon cubes. Hot sauce and Tabasco sauce. Premade or packaged marinades. Premade or packaged taco seasonings. Relishes. Regular salad dressings. Where to find more information:  National Heart, Lung, and [place]: https://wilson-eaton.com/  American Heart Association: www.heart.org Summary  The DASH eating plan is a healthy eating plan that has been shown to reduce high blood pressure (hypertension). It may also reduce your risk for type 2 diabetes, heart disease, and stroke.  With the DASH eating plan, you should limit salt (sodium) intake to 2,300 mg a day. If you have hypertension, you may need to  reduce your sodium intake to 1,500 mg a day.  When on the DASH eating plan, aim to eat more fresh fruits and vegetables, whole grains, lean proteins, low-fat dairy, and heart-healthy fats.  Work with your health care provider or diet and nutrition specialist (dietitian) to adjust your eating plan to your individual calorie needs. This information is not intended to replace advice given to you by your health care provider. Make sure you discuss any questions  you have with your health care provider. Document Released: 03/08/2011 Document Revised: 03/12/2016 Document Reviewed: 03/12/2016 Elsevier Interactive Patient Education  2017 [employer]

## 2016-11-05 NOTE — Assessment & Plan Note (Signed)
Improved with addition of HCTZ 25 mg, continue same. Continue Amlodipine. BMP pending. Refills sent to pharmacy.

## 2016-11-06 ENCOUNTER — Other Ambulatory Visit: Payer: Self-pay | Admitting: Primary Care

## 2016-11-06 DIAGNOSIS — I10 Essential (primary) hypertension: Secondary | ICD-10-CM

## 2016-11-06 DIAGNOSIS — R739 Hyperglycemia, unspecified: Secondary | ICD-10-CM

## 2016-11-06 DIAGNOSIS — E876 Hypokalemia: Secondary | ICD-10-CM

## 2016-11-06 LAB — BASIC METABOLIC PANEL
BUN: 9 mg/dL (ref 6–23)
CALCIUM: 9.1 mg/dL (ref 8.4–10.5)
CO2: 34 mEq/L — ABNORMAL HIGH (ref 19–32)
CREATININE: 0.78 mg/dL (ref 0.40–1.20)
Chloride: 94 mEq/L — ABNORMAL LOW (ref 96–112)
GFR: 110.38 mL/min (ref >60.00)
Glucose, Bld: 108 mg/dL — ABNORMAL HIGH (ref 70–99)
Potassium: 3.3 mEq/L — ABNORMAL LOW (ref 3.5–5.1)
Sodium: 136 mEq/L (ref 135–145)

## 2016-11-12 ENCOUNTER — Encounter: Payer: Self-pay | Admitting: *Deleted

## 2016-11-12 ENCOUNTER — Encounter (INDEPENDENT_AMBULATORY_CARE_PROVIDER_SITE_OTHER): Payer: Self-pay

## 2016-11-15 ENCOUNTER — Telehealth: Payer: Self-pay | Admitting: Primary Care

## 2016-11-15 ENCOUNTER — Encounter: Payer: Self-pay | Admitting: Family

## 2016-11-15 ENCOUNTER — Ambulatory Visit (INDEPENDENT_AMBULATORY_CARE_PROVIDER_SITE_OTHER): Payer: 59 | Admitting: Family

## 2016-11-15 VITALS — BP 132/80 | HR 100 | Temp 98.3°F | Resp 16 | Ht 67.0 in | Wt 157.0 lb

## 2016-11-15 DIAGNOSIS — G43909 Migraine, unspecified, not intractable, without status migrainosus: Secondary | ICD-10-CM | POA: Diagnosis not present

## 2016-11-15 MED ORDER — KETOROLAC TROMETHAMINE 60 MG/2ML IM SOLN
60.0000 mg | Freq: Once | INTRAMUSCULAR | Status: AC
  Administered 2016-11-15: 60 mg via INTRAMUSCULAR

## 2016-11-15 MED ORDER — ONDANSETRON 4 MG PO TBDP: 4.0000 mg | ORAL_TABLET | Freq: Three times a day (TID) | ORAL | 0 refills | Status: DC | PRN

## 2016-11-15 MED ORDER — ONDANSETRON 4 MG PO TBDP
4.0000 mg | ORAL_TABLET | Freq: Once | ORAL | Status: AC
  Administered 2016-11-15: 4 mg via ORAL

## 2016-11-15 MED ORDER — METHYLPREDNISOLONE ACETATE 80 MG/ML IJ SUSP
80.0000 mg | Freq: Once | INTRAMUSCULAR | Status: AC
  Administered 2016-11-15: 80 mg via INTRAMUSCULAR

## 2016-11-15 MED ORDER — SUMATRIPTAN SUCCINATE 50 MG PO TABS: ORAL_TABLET | ORAL | 0 refills | Status: DC

## 2016-11-15 MED FILL — SUMATRIPTAN SUCC 50 MG TAB: 50 | 30 days supply | Qty: 10 | Fill #0

## 2016-11-15 MED FILL — ONDANSETRON ODT 4 MG TABLET: 4 | 6 days supply | Qty: 20 | Fill #0

## 2016-11-15 NOTE — Telephone Encounter (Signed)
[facility] - Client TELEPHONE ADVICE RECORD [facility]  Patient Name: Melissa Chapman  DOB: 02-May-1985    Initial Comment Caller states c/o severe headache.   Nurse Assessment  Nurse: Odis LusterBowers, RN, Bjorn Loserhonda Date/Time Lamount Cohen(Eastern Time): 11/15/2016 2:39:33 PM  Confirm and document reason for call. If symptomatic, describe symptoms. ---Caller states c/o severe headache. Sensitive to light.  Does the patient have any new or worsening symptoms? ---Yes  Will a triage be completed? ---Yes  Related visit to physician within the last 2 weeks? ---No  Does the PT have any chronic conditions? (i.e. diabetes, asthma, etc.) ---Yes  List chronic conditions. ---HTN;  Is the patient pregnant or possibly pregnant? (Ask all females between the ages of 7412-55) ---No  Is this a behavioral health or substance abuse call? ---No     Guidelines    Guideline Title Affirmed Question Affirmed Notes  Headache [1] SEVERE headache (e.g., excruciating) AND [2] not improved after 2 hours of pain medicine    Final Disposition User   See Physician within 4 Hours (or PCP triage) Odis LusterBowers, RN, Bjorn Loserhonda    Comments  CBWN: Caller states she missed nurses cb - nurse notified.  Appt scheduled with Marcos EkeGreg Calone at the [place] office ([facility]) for 3p today. Caller reports that she can be there on time...   Referrals  REFERRED TO PCP OFFICE   Disagree/Comply: Comply

## 2016-11-15 NOTE — Patient Instructions (Addendum)
Thank you for choosing [facility].  SUMMARY AND INSTRUCTIONS:  Start the sumatriptan as needed for severe headaches.  If your symptoms worsen please go directly to the ED.  Please follow up with Jae DireKate for the headaches.   Medication:  Your prescription(s) have been submitted to your pharmacy or been printed and provided for you. Please take as directed and contact our office if you believe you are having problem(s) with the medication(s) or have any questions.  Follow up:  If your symptoms worsen or fail to improve, please contact our office for further instruction, or in case of emergency go directly to the emergency room at the closest medical facility.    Migraine Headache A migraine headache is a very strong throbbing pain on one side or both sides of your head. Migraines can also cause other symptoms. Talk with your doctor about what things may bring on (trigger) your migraine headaches. Follow these instructions at home: Medicines  Take over-the-counter and prescription medicines only as told by your doctor.  Do not drive or use heavy machinery while taking prescription pain medicine.  To prevent or treat constipation while you are taking prescription pain medicine, your doctor may recommend that you: ? Drink enough fluid to keep your pee (urine) clear or pale yellow. ? Take over-the-counter or prescription medicines. ? Eat foods that are high in fiber. These include fresh fruits and vegetables, whole grains, and beans. ? Limit foods that are high in fat and processed sugars. These include fried and sweet foods. Lifestyle  Avoid alcohol.  Do not use any products that contain nicotine or tobacco, such as cigarettes and e-cigarettes. If you need help quitting, ask your doctor.  Get at least 8 hours of sleep every night.  Limit your stress. General instructions   Keep a journal to find out what may bring on your migraines. For example, write down: ? What you  eat and drink. ? How much sleep you get. ? Any change in what you eat or drink. ? Any change in your medicines.  If you have a migraine: ? Avoid things that make your symptoms worse, such as bright lights. ? It may help to lie down in a dark, quiet room. ? Do not drive or use heavy machinery. ? Ask your doctor what activities are safe for you.  Keep all follow-up visits as told by your doctor. This is important. Contact a doctor if:  You get a migraine that is different or worse than your usual migraines. Get help right away if:  Your migraine gets very bad.  You have a fever.  You have a stiff neck.  You have trouble seeing.  Your muscles feel weak or like you cannot control them.  You start to lose your balance a lot.  You start to have trouble walking.  You pass out (faint). This information is not intended to replace advice given to you by your health care provider. Make sure you discuss any questions you have with your health care provider. Document Released: 12/27/2007 Document Revised: 10/07/2015 Document Reviewed: 09/05/2015 Elsevier Interactive Patient Education  2017 [employer]

## 2016-11-15 NOTE — Telephone Encounter (Signed)
Seen in office.

## 2016-11-15 NOTE — Assessment & Plan Note (Signed)
New onset migraine headaches with this episode being the second in the past 2-3 months and refractory to OTC medications. No previous history of migraine headaches. Neurological exam is normal and blood pressure appears adequately controled. Does not appear to have evidence of intracranial pathology at this time. Recommend imaging in the near future given new onset headaches. In office injections of 80 mg of Depomedrol and 60 mg of Toradol as well as 4 mg of oral Zofran administered without complication. Start Zofran and Imitrex. Follow up with PCP for further assessment and treatment.

## 2016-11-15 NOTE — Progress Notes (Signed)
Subjective:    Patient ID: Melissa Chapman, female    DOB: [birth year], 31 y.o.   MRN: [id number]  Chief Complaint  Patient presents with  . Headache    headache that started at 10:30-11 today that has gotten worse, causing her to be sick on her stomach, sensitive to sounds and lights    HPI:  Melissa Chapman is a 31 y.o. female who  has a past medical history of Anxiety and depression; Essential hypertension; and Pancreatitis. and presents today for an acute office visit.   This is a new problem. Associated symptom of a headache has been going on for several hours that has progressively worsened and resulting her to be sick to her stomach with sensitivity to light and sound.  This is the second time she has had a headache like this in a 2 month time period. Located primarily in the front of her head. Blood pressure has been adequately controlled. Previously treated with ibuprofen and improved with rest. Pain is described as throbbing and aching. Severity of the headache is 7/10. No medical or family history of migraine. Modifying factors naproxen which has not helped very much.    No Known Allergies    Outpatient Medications Prior to Visit  Medication Sig Dispense Refill  . amLODipine (NORVASC) 10 MG tablet Take 1 tablet (10 mg total) by mouth daily. 90 tablet 2  . B Complex-C (B-COMPLEX WITH VITAMIN C) tablet Take 1 tablet by mouth daily.    . Biotin 1000 MCG tablet Take 1,000 mcg by mouth daily.    Marland Kitchen. escitalopram (LEXAPRO) 10 MG tablet Take 1 tablet (10 mg total) by mouth daily. 30 tablet 1  . hydrochlorothiazide (HYDRODIURIL) 25 MG tablet Take 1 tablet (25 mg total) by mouth daily. 90 tablet 2  . ibuprofen (ADVIL,MOTRIN) 200 MG tablet Take 200-400 mg by mouth every 6 (six) hours as needed (for pain.).    Marland Kitchen. Melatonin 1 MG TABS Take 1-3 mg by mouth at bedtime.     No facility-administered medications prior to visit.       No past surgical history on file.    Past Medical  History:  Diagnosis Date  . Anxiety and depression   . Essential hypertension   . Pancreatitis       Review of Systems  Constitutional: Negative for chills and fever.  Eyes: Positive for photophobia.  Respiratory: Negative for chest tightness and shortness of breath.   Cardiovascular: Negative for chest pain, palpitations and leg swelling.  Gastrointestinal: Positive for nausea. Negative for vomiting.  Musculoskeletal: Negative for neck pain and neck stiffness.  Neurological: Positive for headaches.      Objective:    BP 132/80 (BP Location: Left Arm, Patient Position: Sitting, Cuff Size: Normal)   Pulse 100   Temp 98.3 F (36.8 C) (Oral)   Resp 16   Ht 5\' 7"  (1.702 m)   Wt 157 lb (71.2 kg)   SpO2 95%   BMI 24.59 kg/m  Nursing note and vital signs reviewed.  Physical Exam  Constitutional: She is oriented to person, place, and time. She appears well-developed and well-nourished. No distress.  Eyes: Pupils are equal, round, and reactive to light. Conjunctivae and EOM are normal.  Cardiovascular: Normal rate, regular rhythm, normal heart sounds and intact distal pulses.  Exam reveals no gallop and no friction rub.   No murmur heard. Pulmonary/Chest: Effort normal and breath sounds normal. No respiratory distress. She has no wheezes. She has no rales. She  exhibits no tenderness.  Neurological: She is alert and oriented to person, place, and time. No cranial nerve deficit.  Skin: Skin is warm and dry.  Psychiatric: She has a normal mood and affect. Her behavior is normal. Judgment and thought content normal.       Assessment & Plan:   Problem List Items Addressed This Visit      Cardiovascular and Mediastinum   Migraine without status migrainosus, not intractable - Primary    New onset migraine headaches with this episode being the second in the past 2-3 months and refractory to OTC medications. No previous history of migraine headaches. Neurological exam is normal and  blood pressure appears adequately controled. Does not appear to have evidence of intracranial pathology at this time. Recommend imaging in the near future given new onset headaches. In office injections of 80 mg of Depomedrol and 60 mg of Toradol as well as 4 mg of oral Zofran administered without complication. Start Zofran and Imitrex. Follow up with PCP for further assessment and treatment.       Relevant Medications   SUMAtriptan (IMITREX) 50 MG tablet   ketorolac (TORADOL) injection 60 mg (Completed)   methylPREDNISolone acetate (DEPO-MEDROL) injection 80 mg (Completed)   ondansetron (ZOFRAN-ODT) disintegrating tablet 4 mg (Completed)       I am having Ms. Creelman start on SUMAtriptan and ondansetron. I am also having her maintain her Biotin, B-complex with vitamin C, Melatonin, ibuprofen, escitalopram, hydrochlorothiazide, and amLODipine. We administered ketorolac, methylPREDNISolone acetate, and ondansetron.   Meds ordered this encounter  Medications  . SUMAtriptan (IMITREX) 50 MG tablet    Sig: Take 1 tablet by mouth at the onset of a headache and may repeat in 2 hours if headache persists or recurs.    Dispense:  10 tablet    Refill:  0    Order Specific Question:   Supervising Provider    Answer:   Hillard Danker A [[id number]]  . ondansetron (ZOFRAN ODT) 4 MG disintegrating tablet    Sig: Take 1 tablet (4 mg total) by mouth every 8 (eight) hours as needed for nausea or vomiting.    Dispense:  20 tablet    Refill:  0    Order Specific Question:   Supervising Provider    Answer:   Hillard Danker A [[id number]]  . ketorolac (TORADOL) injection 60 mg  . methylPREDNISolone acetate (DEPO-MEDROL) injection 80 mg  . ondansetron (ZOFRAN-ODT) disintegrating tablet 4 mg     Follow-up: Return if symptoms worsen or fail to improve.  Jeanine Luz, FNP

## 2016-11-15 NOTE — Telephone Encounter (Signed)
Pt has appt with Jeanine LuzGregory Calone FNP 11/15/16 at 3PM.

## 2016-11-20 ENCOUNTER — Telehealth: Payer: Self-pay

## 2016-11-20 NOTE — Telephone Encounter (Signed)
Sounds like the Lexapro is not helping, also possibly inducing migraines (along with stress), then recommend we wean her off until she can be seen in the office. Have her start reducing to 1/2 tablet daily for 1 week then 1/2 tablet every other day for 1 week then stop.

## 2016-11-20 NOTE — Telephone Encounter (Signed)
Pt called to report that she is feeling very anxious... Lexapro did seem to help in the beginning, also she has been under stress and has had 2 migraines in the past week with nausea... Pt has appt scheduled to f/u 12/04/16.... I asked if pt could come in sooner to be evaluated... She states that due to her work schedule she will be unable to come in sooner.... Please advise

## 2016-11-21 ENCOUNTER — Encounter (HOSPITAL_COMMUNITY): Payer: Self-pay

## 2016-11-21 ENCOUNTER — Inpatient Hospital Stay (HOSPITAL_COMMUNITY)
Admission: EM | Admit: 2016-11-21 | Discharge: 2016-11-23 | DRG: 439 | Discharge status: Against Medical Advice | Payer: 59 | Attending: Family Medicine | Admitting: Family Medicine

## 2016-11-21 DIAGNOSIS — F1010 Alcohol abuse, uncomplicated: Secondary | ICD-10-CM | POA: Diagnosis not present

## 2016-11-21 DIAGNOSIS — E860 Dehydration: Secondary | ICD-10-CM | POA: Diagnosis not present

## 2016-11-21 DIAGNOSIS — Z833 Family history of diabetes mellitus: Secondary | ICD-10-CM

## 2016-11-21 DIAGNOSIS — E876 Hypokalemia: Secondary | ICD-10-CM | POA: Diagnosis present

## 2016-11-21 DIAGNOSIS — Z818 Family history of other mental and behavioral disorders: Secondary | ICD-10-CM

## 2016-11-21 DIAGNOSIS — F29 Unspecified psychosis not due to a substance or known physiological condition: Secondary | ICD-10-CM | POA: Diagnosis not present

## 2016-11-21 DIAGNOSIS — K859 Acute pancreatitis without necrosis or infection, unspecified: Principal | ICD-10-CM | POA: Diagnosis present

## 2016-11-21 DIAGNOSIS — Z79899 Other long term (current) drug therapy: Secondary | ICD-10-CM

## 2016-11-21 DIAGNOSIS — R197 Diarrhea, unspecified: Secondary | ICD-10-CM | POA: Diagnosis present

## 2016-11-21 DIAGNOSIS — R17 Unspecified jaundice: Secondary | ICD-10-CM

## 2016-11-21 DIAGNOSIS — F410 Panic disorder [episodic paroxysmal anxiety] without agoraphobia: Secondary | ICD-10-CM | POA: Diagnosis present

## 2016-11-21 DIAGNOSIS — E872 Acidosis: Secondary | ICD-10-CM | POA: Diagnosis not present

## 2016-11-21 DIAGNOSIS — K852 Alcohol induced acute pancreatitis without necrosis or infection: Secondary | ICD-10-CM

## 2016-11-21 DIAGNOSIS — F419 Anxiety disorder, unspecified: Secondary | ICD-10-CM | POA: Diagnosis not present

## 2016-11-21 DIAGNOSIS — Z8249 Family history of ischemic heart disease and other diseases of the circulatory system: Secondary | ICD-10-CM

## 2016-11-21 DIAGNOSIS — I10 Essential (primary) hypertension: Secondary | ICD-10-CM | POA: Diagnosis not present

## 2016-11-21 DIAGNOSIS — F329 Major depressive disorder, single episode, unspecified: Secondary | ICD-10-CM | POA: Diagnosis not present

## 2016-11-21 LAB — URINALYSIS, ROUTINE W REFLEX MICROSCOPIC
Bilirubin Urine: NEGATIVE
Glucose, UA: 50 mg/dL — AB
Ketones, ur: 5 mg/dL — AB
Leukocytes, UA: NEGATIVE
Nitrite: NEGATIVE
Protein, ur: 100 mg/dL — AB
Specific Gravity, Urine: 1.019 (ref 1.005–1.030)
pH: 5 (ref 5.0–8.0)

## 2016-11-21 LAB — CBC WITH DIFFERENTIAL/PLATELET
BASOS ABS: 0.1 10*3/uL (ref 0.0–0.1)
BASOS PCT: 0 %
EOS PCT: 1 %
Eosinophils Absolute: 0.1 10*3/uL (ref 0.0–0.7)
HEMATOCRIT: 38.5 % (ref 36.0–46.0)
Hemoglobin: 14.1 g/dL (ref 12.0–15.0)
LYMPHS PCT: 10 %
Lymphs Abs: 1.2 10*3/uL (ref 0.7–4.0)
MCH: 36.3 pg — ABNORMAL HIGH (ref 26.0–34.0)
MCHC: 36.6 g/dL — AB (ref 30.0–36.0)
MCV: 99.2 fL (ref 78.0–100.0)
MONO ABS: 1 10*3/uL (ref 0.1–1.0)
Monocytes Relative: 9 %
Neutro Abs: 9.5 10*3/uL — ABNORMAL HIGH (ref 1.7–7.7)
Neutrophils Relative %: 80 %
Platelets: 424 10*3/uL — ABNORMAL HIGH (ref 150–400)
RBC: 3.88 MIL/uL (ref 3.87–5.11)
RDW: 14 % (ref 11.5–15.5)
WBC: 11.8 10*3/uL — ABNORMAL HIGH (ref 4.0–10.5)

## 2016-11-21 LAB — COMPREHENSIVE METABOLIC PANEL
ALK PHOS: 108 U/L (ref 38–126)
ALT: 29 U/L (ref 14–54)
AST: 79 U/L — AB (ref 15–41)
Albumin: 4.3 g/dL (ref 3.5–5.0)
Anion gap: 19 — ABNORMAL HIGH (ref 5–15)
BUN: 25 mg/dL — AB (ref 6–20)
CALCIUM: 8.9 mg/dL (ref 8.9–10.3)
CHLORIDE: 90 mmol/L — AB (ref 101–111)
CO2: 26 mmol/L (ref 22–32)
CREATININE: 1.21 mg/dL — AB (ref 0.44–1.00)
GFR, EST NON AFRICAN AMERICAN: 59 mL/min — AB (ref >60)
Glucose, Bld: 135 mg/dL — ABNORMAL HIGH (ref 65–99)
Potassium: 4.4 mmol/L (ref 3.5–5.1)
SODIUM: 135 mmol/L (ref 135–145)
Total Bilirubin: 2.4 mg/dL — ABNORMAL HIGH (ref 0.3–1.2)
Total Protein: 8.1 g/dL (ref 6.5–8.1)

## 2016-11-21 LAB — I-STAT BETA HCG BLOOD, ED (MC, WL, AP ONLY): I-stat hCG, quantitative: <5 m[IU]/mL (ref <5)

## 2016-11-21 LAB — LIPASE, BLOOD: Lipase: 210 U/L — ABNORMAL HIGH (ref 11–51)

## 2016-11-21 LAB — LIPID PANEL
CHOLESTEROL: 138 mg/dL (ref 0–200)
HDL: 66 mg/dL (ref >40)
LDL Cholesterol: UNDETERMINED mg/dL (ref 0–99)
Total CHOL/HDL Ratio: 2.1 RATIO
Triglycerides: 560 mg/dL — ABNORMAL HIGH (ref <150)
VLDL: UNDETERMINED mg/dL (ref 0–40)

## 2016-11-21 LAB — PREGNANCY, URINE: PREG TEST UR: NEGATIVE

## 2016-11-21 MED ORDER — SODIUM CHLORIDE 0.9 % IV SOLN
INTRAVENOUS | Status: DC
  Administered 2016-11-21 – 2016-11-23 (×3): via INTRAVENOUS

## 2016-11-21 MED ORDER — SODIUM CHLORIDE 0.9 % IV BOLUS (SEPSIS)
1000.0000 mL | Freq: Once | INTRAVENOUS | Status: AC
  Administered 2016-11-21: 1000 mL via INTRAVENOUS

## 2016-11-21 MED ORDER — HYDROMORPHONE HCL 1 MG/ML IJ SOLN: 0.5000 mg | Freq: Once | INTRAMUSCULAR | Status: DC

## 2016-11-21 MED ORDER — ONDANSETRON HCL 4 MG/2ML IJ SOLN: 4.0000 mg | Freq: Once | INTRAMUSCULAR | Status: DC

## 2016-11-21 MED ORDER — HYDROMORPHONE HCL 1 MG/ML PO LIQD
0.5000 mg | Freq: Once | ORAL | Status: AC
  Administered 2016-11-21: 0.5 mg via ORAL

## 2016-11-21 MED ORDER — DIPHENHYDRAMINE HCL 50 MG/ML IJ SOLN
25.0000 mg | Freq: Once | INTRAMUSCULAR | Status: AC
  Administered 2016-11-21: 25 mg via INTRAVENOUS
  Filled 2016-11-21: qty 1

## 2016-11-21 MED ORDER — LORAZEPAM 1 MG PO TABS
1.0000 mg | ORAL_TABLET | Freq: Four times a day (QID) | ORAL | Status: DC | PRN
  Administered 2016-11-22 (×2): 1 mg via ORAL
  Filled 2016-11-21 (×2): qty 1

## 2016-11-21 MED ORDER — FOLIC ACID 1 MG PO TABS
1.0000 mg | ORAL_TABLET | Freq: Every day | ORAL | Status: DC
  Administered 2016-11-21 – 2016-11-23 (×3): 1 mg via ORAL
  Filled 2016-11-21 (×3): qty 1

## 2016-11-21 MED ORDER — KETOROLAC TROMETHAMINE 30 MG/ML IJ SOLN
15.0000 mg | Freq: Once | INTRAMUSCULAR | Status: AC
  Administered 2016-11-21: 15 mg via INTRAVENOUS
  Filled 2016-11-21: qty 1

## 2016-11-21 MED ORDER — AMLODIPINE BESYLATE 10 MG PO TABS
10.0000 mg | ORAL_TABLET | Freq: Every day | ORAL | Status: DC
  Administered 2016-11-21 – 2016-11-23 (×3): 10 mg via ORAL
  Filled 2016-11-21 (×3): qty 1

## 2016-11-21 MED ORDER — PROCHLORPERAZINE EDISYLATE 5 MG/ML IJ SOLN
10.0000 mg | Freq: Four times a day (QID) | INTRAMUSCULAR | Status: DC | PRN
  Administered 2016-11-21 – 2016-11-22 (×2): 10 mg via INTRAVENOUS
  Filled 2016-11-21 (×3): qty 2

## 2016-11-21 MED ORDER — FAMOTIDINE IN NACL 20-0.9 MG/50ML-% IV SOLN
20.0000 mg | Freq: Once | INTRAVENOUS | Status: AC
  Administered 2016-11-21: 20 mg via INTRAVENOUS
  Filled 2016-11-21: qty 50

## 2016-11-21 MED ORDER — KETOROLAC TROMETHAMINE 15 MG/ML IJ SOLN
15.0000 mg | Freq: Four times a day (QID) | INTRAMUSCULAR | Status: DC | PRN
  Administered 2016-11-21 – 2016-11-23 (×6): 15 mg via INTRAVENOUS
  Filled 2016-11-21 (×6): qty 1

## 2016-11-21 MED ORDER — HYDROMORPHONE HCL 1 MG/ML PO LIQD
0.5000 mg | ORAL | Status: DC | PRN
  Administered 2016-11-21 – 2016-11-22 (×4): 0.5 mg via ORAL
  Filled 2016-11-21 (×4): qty 1

## 2016-11-21 MED ORDER — ONDANSETRON HCL 4 MG/2ML IJ SOLN
4.0000 mg | Freq: Four times a day (QID) | INTRAMUSCULAR | Status: DC | PRN
  Administered 2016-11-21 – 2016-11-23 (×3): 4 mg via INTRAVENOUS
  Filled 2016-11-21 (×3): qty 2

## 2016-11-21 MED ORDER — ONDANSETRON HCL 4 MG/2ML IJ SOLN
4.0000 mg | Freq: Once | INTRAMUSCULAR | Status: AC
  Administered 2016-11-21: 4 mg via INTRAVENOUS
  Filled 2016-11-21: qty 2

## 2016-11-21 MED ORDER — ONDANSETRON HCL 4 MG PO TABS
4.0000 mg | ORAL_TABLET | Freq: Four times a day (QID) | ORAL | Status: DC | PRN
Start: 2016-11-21 — End: 2016-11-23

## 2016-11-21 MED ORDER — PROCHLORPERAZINE EDISYLATE 5 MG/ML IJ SOLN
10.0000 mg | Freq: Once | INTRAMUSCULAR | Status: AC
  Administered 2016-11-21: 10 mg via INTRAVENOUS
  Filled 2016-11-21: qty 2

## 2016-11-21 MED ORDER — LORAZEPAM 2 MG/ML IJ SOLN
1.0000 mg | Freq: Four times a day (QID) | INTRAMUSCULAR | Status: DC | PRN
Start: 2016-11-21 — End: 2016-11-23
  Filled 2016-11-21: qty 1

## 2016-11-21 MED ORDER — HYDROMORPHONE HCL 1 MG/ML IJ SOLN
0.5000 mg | Freq: Once | INTRAMUSCULAR | Status: AC
  Administered 2016-11-21: 0.5 mg via INTRAVENOUS
  Filled 2016-11-21: qty 1

## 2016-11-21 MED ORDER — ESCITALOPRAM OXALATE 10 MG PO TABS
10.0000 mg | ORAL_TABLET | Freq: Every day | ORAL | Status: DC
  Administered 2016-11-21 – 2016-11-23 (×3): 10 mg via ORAL
  Filled 2016-11-21 (×3): qty 1

## 2016-11-21 MED ORDER — ONDANSETRON 4 MG PO TBDP
4.0000 mg | ORAL_TABLET | Freq: Once | ORAL | Status: AC
  Administered 2016-11-21: 4 mg via ORAL
  Filled 2016-11-21: qty 1

## 2016-11-21 MED ORDER — GI COCKTAIL ~~LOC~~
30.0000 mL | Freq: Once | ORAL | Status: AC
  Administered 2016-11-21: 30 mL via ORAL
  Filled 2016-11-21: qty 30

## 2016-11-21 MED ORDER — VITAMIN B-1 100 MG PO TABS
100.0000 mg | ORAL_TABLET | Freq: Every day | ORAL | Status: DC
  Administered 2016-11-21 – 2016-11-23 (×3): 100 mg via ORAL
  Filled 2016-11-21 (×3): qty 1

## 2016-11-21 NOTE — Progress Notes (Addendum)
Pt inadvertantly given 1mg  oral suspension Dilaudid instead of ordered dose of 0.5 mg. Vitals taken 15 min after administration and they were stable. Will continue to monitor pt. Charge nurse notified. Paged attending, Dr. Luberta Robertson. After not hearing back for awhile, paged night shift hospitalist, Programmer, applications. Spoke w/Katherine Wellsite geologist re: above. See MAR for order placed.

## 2016-11-21 NOTE — ED Notes (Signed)
Pt ambulatory to bathroom. Pt went with her mother's assistance.

## 2016-11-21 NOTE — H&P (Signed)
History and Physical:    Melissa Chapman   JSI:[id number] DOB: 08-04-1985 DOA: 11/21/2016  Referring MD/provider: Wynetta Emery PCP: Doreene Nest, NP   Patient coming from: Home  Chief Complaint: Intractable nausea and vomiting  History of Present Illness:   Melissa Chapman is an 31 y.o. female with past medical history significant for anxiety, hypertension and a single episode of pancreatitis almost exactly 1 year ago thought to be secondary to alcohol use is now admitted for pancreatitis.  Patient states that she was well until 4 AM this morning when she woke with nausea and vomiting. Over the course of the morning she felt increasingly anxious and had a panic attack and presented to the emergency room for that. Patient notes she's had several episodes of vomitus, no blood and one episode of diarrhea also without blood. Patient admits to abdominal pain in her epigastric region without radiation. She admits that this is similar to her previous episodes of pancreatitis a year ago.  Patient admits to drinking 3 beers a day, states she drinks no more than that. She thinks she may have drunk maybe 9 beers over the past couple of days. Of note patient was recently started on HCTZ 3 Chapman ago. She was also started on Lexapro 3 Chapman ago.  ED Course:  The patient  In the emergency room she was noted to have ongoing vomiting and nausea and was treated symptomatically. Workup reveals elevated lipase to 210 and elevated bilirubin to 2.4. She is also noted to have an anion gap acidosis with a gap of 19. She was treated with IV hydration, kept nothing by mouth and dilated and Compazine for pain and nausea. They attempted to give her sips however she was unable to tolerate that so she is now admitted for ongoing management of nausea and vomiting.   ROS:   ROS  Past Medical History:   Past Medical History:  Diagnosis Date  . Anxiety and depression   . Essential hypertension   .  Pancreatitis     Past Surgical History:   History reviewed. No pertinent surgical history.  Social History:   Social History   Social History  . Marital status: Single    Spouse name: N/A  . Number of children: N/A  . Years of education: N/A   Occupational History  . Not on file.   Social History Main Topics  . Smoking status: Never Smoker  . Smokeless tobacco: Never Used  . Alcohol use Yes  . Drug use: No  . Sexual activity: Yes   Other Topics Concern  . Not on file   Social History Narrative   Single.    1 son.   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    Allergies   Patient has no known allergies.  Family history:   Family History  Problem Relation Age of Onset  . Depression Mother   . Hypertension Mother   . Hypertension Father   . Diabetes Father   . Depression Sister   . Cancer Paternal Grandmother        Sinus     Current Medications:   Prior to Admission medications   Medication Sig Start Date End Date Taking? Authorizing Provider  amLODipine (NORVASC) 10 MG tablet Take 1 tablet (10 mg total) by mouth daily. 11/05/16  Yes Doreene Nest, NP  escitalopram (LEXAPRO) 10 MG tablet Take 1 tablet (10 mg total) by mouth daily. 10/22/16  Yes Doreene Nest, NP  hydrochlorothiazide (HYDRODIURIL) 25 MG tablet Take 1 tablet (25 mg total) by mouth daily. 11/05/16  Yes Doreene Nest, NP  ondansetron (ZOFRAN ODT) 4 MG disintegrating tablet Take 1 tablet (4 mg total) by mouth every 8 (eight) hours as needed for nausea or vomiting. 11/15/16  Yes Veryl Speak, FNP  SUMAtriptan (IMITREX) 50 MG tablet Take 1 tablet by mouth at the onset of a headache and may repeat in 2 hours if headache persists or recurs. Patient taking differently: Take 50 mg by mouth every 2 (two) hours as needed for migraine. Take 1 tablet by mouth at the onset of a headache and may repeat in 2 hours if headache persists or recurs. 11/15/16  Yes Veryl Speak, FNP    Physical Exam:   Vitals:   11/21/16 1610 11/21/16 0837 11/21/16 1054  BP: 110/86  132/81  Pulse: (!) 120  (!) 105  Resp: 17  17  Temp: 98 F (36.7 C)    TempSrc: Oral    SpO2: 100%  95%  Weight:  71.2 kg (157 lb)   Height:  5\' 7"  (1.702 m)      Physical Exam: Blood pressure 132/81, pulse (!) 105, temperature 98 F (36.7 C), temperature source Oral, resp. rate 17, height 5\' 7"  (1.702 m), weight 71.2 kg (157 lb), SpO2 95 %. Gen: Anxious appearing female lying in bed sleeping. Head: Normocephalic, atraumatic. Eyes:  Sclerae may be with slight icterus, she has bilateral mild conjunctival injection. Mouth: Oropharynx reveals no lesions Neck: Supple, no jugular venous distention. Chest: Lungs are clear to auscultation with good air movement. No rales, rhonchi or wheezes.  CV: Heart sounds are tachycardia with an S1, S2. No murmurs, rubs, clicks, or gallops. Abdomen: She has normoactive bowel sounds. She has no tenderness to palpation in her epigastric region however notes that she has pain even without palpation. Murphy's sign is negative. No rebound no guarding. Unremarkable abdominal exam. Extremities: Extremities are without clubbing, or cyanosis. No edema. Skin: Warm and dry. No rashes, lesions or wounds. Neuro: Alert and oriented times 3; grossly nonfocal Psych: Insight is good and judgment is appropriate. Mood and affect are anxious.   Data Review:    Labs: Basic Metabolic Panel:  Recent Labs Lab 11/21/16 0941  NA 135  K 4.4  CL 90*  CO2 26  GLUCOSE 135*  BUN 25*  CREATININE 1.21*  CALCIUM 8.9   Liver Function Tests:  Recent Labs Lab 11/21/16 0941  AST 79*  ALT 29  ALKPHOS 108  BILITOT 2.4*  PROT 8.1  ALBUMIN 4.3    Recent Labs Lab 11/21/16 0941  LIPASE 210*   No results for input(s): AMMONIA in the last 168 hours. CBC:  Recent Labs Lab 11/21/16 0900  WBC 11.8*  NEUTROABS 9.5*  HGB 14.1  HCT 38.5  MCV 99.2  PLT 424*     Cardiac Enzymes: No results for input(s): CKTOTAL, CKMB, CKMBINDEX, TROPONINI in the last 168 hours.  BNP (last 3 results) No results for input(s): PROBNP in the last 8760 hours. CBG: No results for input(s): GLUCAP in the last 168 hours.  Urinalysis    Component Value Date/Time   COLORURINE AMBER (A) 11/21/2016 0946   APPEARANCEUR CLOUDY (A) 11/21/2016 0946   APPEARANCEUR Hazy 12/11/2013 2130   LABSPEC 1.019 11/21/2016 0946   LABSPEC 1.027 12/11/2013 2130   PHURINE 5.0 11/21/2016 0946   GLUCOSEU 50 (A) 11/21/2016 0946   GLUCOSEU Negative 12/11/2013  2130   HGBUR SMALL (A) 11/21/2016 0946   BILIRUBINUR NEGATIVE 11/21/2016 0946   BILIRUBINUR Negative 12/11/2013 2130   KETONESUR 5 (A) 11/21/2016 0946   PROTEINUR 100 (A) 11/21/2016 0946   UROBILINOGEN 0.2 04/14/2009 0802   NITRITE NEGATIVE 11/21/2016 0946   LEUKOCYTESUR NEGATIVE 11/21/2016 0946   LEUKOCYTESUR Trace 12/11/2013 2130      Radiographic Studies: No results found.    Assessment/Plan:   Principal Problem:   Pancreatitis Active Problems:   Essential hypertension   Anxiety and depression   Elevated bilirubin  PANCREATITIS Previous episode of pink or tetanus was thought to be secondary to alcohol use although it's hard to believe that she would have pancreatic tetanus from just 3 beers a day if in fact that is an accurate count. Her AST is indeed greater than her ALT so that is suggestive.  Her bili is elevated out of proportion to her transaminases so will get an upper quadrant ultrasound. However patient does not have right upper quadrant tenderness in her Murphy's sign is negative. HCTZ is a class III pancreatitis drug so this may be contributing as well. Will treat with nothing by mouth status, IV hydration and symptomatically management.  ALCOHOL USE Patient states she only drinks 3 beers a day. Denies any history of, located withdrawal. However given hypertension a baseline tachycardia, we'll  place patient on CIWA protocol with when necessary Ativan.  HTN DC HCTZ given association with pancreatitis. Continue amlodipine. Starting atenolol may be effective in helping manage her anxiety as well as hypertension given baseline tachycardia.  ANXIETY Continue Lexapro.   Other information:   DVT prophylaxis: Lovenox ordered. Code Status: Full code. Family Communication: Patient states her family is aware she is in the hospital.  Disposition Plan: Home  Consults called: None  Admission status: Observation.   The medical decision making on this patient was of high complexity and the patient is at high risk for clinical deterioration, therefore this is a level 3 visit.   Horatio Pel Orma Flaming [facility] Pager [phone] Cell: [phone]   If 7PM-7AM, please contact night-coverage [email or web address] Password [email or web address] 11/21/2016, 3:21 PM

## 2016-11-21 NOTE — ED Notes (Signed)
Pt reports that the pepcid "did not work" and her stomach pain is persisting. Pt given a ginger ale for PO challenge.

## 2016-11-21 NOTE — ED Notes (Signed)
ED TO INPATIENT HANDOFF REPORT  Name/Age/Gender Melissa Chapman 31 y.o. female  Code Status    Code Status Orders        Start     Ordered   11/21/16 1509  Full code  Continuous     11/21/16 1512    Code Status History    Date Active Date Inactive Code Status Order ID Comments User Context   This patient has a current code status but no historical code status.      Home/SNF/Other Home  Chief Complaint Anxiety  Level of Care/Admitting Diagnosis ED Disposition    ED Disposition Condition Comment   Admit  Hospital Area: [facility] [[id number]]  Level of Care: Med-Surg [16]  Diagnosis: Pancreatitis [941740]  Admitting Physician: Vashti Hey [[id number]]  Attending Physician: Vashti Hey [[id number]]  PT Class (Do Not Modify): Observation [104]  PT Acc Code (Do Not Modify): Observation [10022]       Medical History Past Medical History:  Diagnosis Date  . Anxiety and depression   . Essential hypertension   . Pancreatitis     Allergies No Known Allergies  IV Location/Drains/Wounds Patient Lines/Drains/Airways Status   Active Line/Drains/Airways    Name:   Placement date:   Placement time:   Site:   Days:   Peripheral IV 11/21/16 Right Antecubital  11/21/16    0929    Antecubital    less than 1          Labs/Imaging Results for orders placed or performed during the hospital encounter of 11/21/16 (from the past 48 hour(s))  CBC with Differential     Status: Abnormal   Collection Time: 11/21/16  9:00 AM  Result Value Ref Range   WBC 11.8 (H) 4.0 - 10.5 K/uL   RBC 3.88 3.87 - 5.11 MIL/uL   Hemoglobin 14.1 12.0 - 15.0 g/dL   HCT 38.5 36.0 - 46.0 %   MCV 99.2 78.0 - 100.0 fL   MCH 36.3 (H) 26.0 - 34.0 pg   MCHC 36.6 (H) 30.0 - 36.0 g/dL   RDW 14.0 11.5 - 15.5 %   Platelets 424 (H) 150 - 400 K/uL   Neutrophils Relative % 80 %   Neutro Abs 9.5 (H) 1.7 - 7.7 K/uL   Lymphocytes Relative 10 %   Lymphs Abs 1.2 0.7 -  4.0 K/uL   Monocytes Relative 9 %   Monocytes Absolute 1.0 0.1 - 1.0 K/uL   Eosinophils Relative 1 %   Eosinophils Absolute 0.1 0.0 - 0.7 K/uL   Basophils Relative 0 %   Basophils Absolute 0.1 0.0 - 0.1 K/uL  I-Stat Beta hCG blood, ED ([facility], [facility], [facility] only)     Status: None   Collection Time: 11/21/16  9:37 AM  Result Value Ref Range   I-stat hCG, quantitative <5.0 <5 mIU/mL   Comment 3            Comment:   GEST. AGE      CONC.  (mIU/mL)   <=1 WEEK        5 - 50     2 WEEKS       50 - 500     3 WEEKS       100 - 10,000     4 WEEKS     1,000 - 30,000        FEMALE AND NON-PREGNANT FEMALE:     LESS THAN 5 mIU/mL   Comprehensive metabolic panel  Status: Abnormal   Collection Time: 11/21/16  9:41 AM  Result Value Ref Range   Sodium 135 135 - 145 mmol/L   Potassium 4.4 3.5 - 5.1 mmol/L   Chloride 90 (L) 101 - 111 mmol/L   CO2 26 22 - 32 mmol/L   Glucose, Bld 135 (H) 65 - 99 mg/dL   BUN 25 (H) 6 - 20 mg/dL   Creatinine, Ser 1.21 (H) 0.44 - 1.00 mg/dL   Calcium 8.9 8.9 - 10.3 mg/dL   Total Protein 8.1 6.5 - 8.1 g/dL   Albumin 4.3 3.5 - 5.0 g/dL   AST 79 (H) 15 - 41 U/L   ALT 29 14 - 54 U/L   Alkaline Phosphatase 108 38 - 126 U/L   Total Bilirubin 2.4 (H) 0.3 - 1.2 mg/dL   GFR calc non Af Amer 59 (L) >60 mL/min   GFR calc Af Amer >60 >60 mL/min    Comment: (NOTE) The eGFR has been calculated using the CKD EPI equation. This calculation has not been validated in all clinical situations. eGFR's persistently <60 mL/min signify possible Chronic Kidney Disease.    Anion gap 19 (H) 5 - 15  Lipase, blood     Status: Abnormal   Collection Time: 11/21/16  9:41 AM  Result Value Ref Range   Lipase 210 (H) 11 - 51 U/L  Urinalysis, Routine w reflex microscopic     Status: Abnormal   Collection Time: 11/21/16  9:46 AM  Result Value Ref Range   Color, Urine AMBER (A) YELLOW    Comment: BIOCHEMICALS MAY BE AFFECTED BY COLOR   APPearance CLOUDY (A) CLEAR   Specific Gravity, Urine 1.019  1.005 - 1.030   pH 5.0 5.0 - 8.0   Glucose, UA 50 (A) NEGATIVE mg/dL   Hgb urine dipstick SMALL (A) NEGATIVE   Bilirubin Urine NEGATIVE NEGATIVE   Ketones, ur 5 (A) NEGATIVE mg/dL   Protein, ur 100 (A) NEGATIVE mg/dL   Nitrite NEGATIVE NEGATIVE   Leukocytes, UA NEGATIVE NEGATIVE   RBC / HPF 6-30 0 - 5 RBC/hpf   WBC, UA 0-5 0 - 5 WBC/hpf   Bacteria, UA MANY (A) NONE SEEN   Squamous Epithelial / LPF 6-30 (A) NONE SEEN   Mucus PRESENT    Trichomonas, UA PRESENT    Hyaline Casts, UA PRESENT   I-Stat beta hCG blood, ED ([facility], [facility], [facility] only)     Status: None   Collection Time: 11/21/16 10:02 AM  Result Value Ref Range   I-stat hCG, quantitative <5.0 <5 mIU/mL   Comment 3            Comment:   GEST. AGE      CONC.  (mIU/mL)   <=1 WEEK        5 - 50     2 WEEKS       50 - 500     3 WEEKS       100 - 10,000     4 WEEKS     1,000 - 30,000        FEMALE AND NON-PREGNANT FEMALE:     LESS THAN 5 mIU/mL    No results found.  Pending Labs [facility]     Ordered   11/22/16 0500  Comprehensive metabolic panel  Tomorrow morning,   R     11/21/16 1512   11/22/16 0500  CBC  Tomorrow morning,   R     11/21/16 1512  11/21/16 1516  Pregnancy, urine  Once,   R     11/21/16 1516   11/21/16 1512  Calcium, ionized  Add-on,   R     11/21/16 1512   11/21/16 1511  Lipid panel  Add-on,   R     11/21/16 1512   11/21/16 1508  HIV antibody (Routine Testing)  Once,   R     11/21/16 1512      Vitals/Pain Today's Vitals   11/21/16 1054 11/21/16 1130 11/21/16 1254 11/21/16 1506  BP: 132/81     Pulse: (!) 105     Resp: 17     Temp:      TempSrc:      SpO2: 95%     Weight:      Height:      PainSc:  '7  7  7     '$ Isolation Precautions No active isolations  Medications Medications  ondansetron (ZOFRAN) injection 4 mg (not administered)  amLODipine (NORVASC) tablet 10 mg (not administered)  escitalopram (LEXAPRO) tablet 10 mg (not administered)  0.9 %  sodium chloride infusion  (not administered)  ondansetron (ZOFRAN) tablet 4 mg (not administered)    Or  ondansetron (ZOFRAN) injection 4 mg (not administered)  ketorolac (TORADOL) 15 MG/ML injection 15 mg (not administered)  folic acid (FOLVITE) tablet 1 mg (not administered)  thiamine (VITAMIN B-1) tablet 100 mg (not administered)  prochlorperazine (COMPAZINE) injection 10 mg (not administered)  sodium chloride 0.9 % bolus 1,000 mL (0 mLs Intravenous Stopped 11/21/16 1045)  prochlorperazine (COMPAZINE) injection 10 mg (10 mg Intravenous Given 11/21/16 0925)  diphenhydrAMINE (BENADRYL) injection 25 mg (25 mg Intravenous Given 11/21/16 1005)  ketorolac (TORADOL) 30 MG/ML injection 15 mg (15 mg Intravenous Given 11/21/16 1005)  famotidine (PEPCID) IVPB 20 mg premix (0 mg Intravenous Stopped 11/21/16 1045)  sodium chloride 0.9 % bolus 1,000 mL (0 mLs Intravenous Stopped 11/21/16 1258)  gi cocktail (Maalox,Lidocaine,Donnatal) (30 mLs Oral Given 11/21/16 1132)  HYDROmorphone (DILAUDID) injection 0.5 mg (0.5 mg Intravenous Given 11/21/16 1258)  ondansetron (ZOFRAN) injection 4 mg (4 mg Intravenous Given 11/21/16 1258)    Mobility Walks

## 2016-11-21 NOTE — Telephone Encounter (Signed)
Left detailed message on vm per dpr. Advised her of KAte's message and that she needed to make an OV to F/U

## 2016-11-21 NOTE — Progress Notes (Signed)
Pt reports she's still in a lot of pain after toradol administration. Paged Dr. Luberta Robertson.  Dr. Luberta Robertson returned page and will write orders for additional pain medication.

## 2016-11-21 NOTE — ED Provider Notes (Signed)
[facility]-EMERGENCY DEPT Provider Note   CSN: [id number] Arrival date & time: 11/21/16  0827     History   Chief Complaint Chief Complaint  Patient presents with  . Anxiety    HPI  Blood pressure 132/81, pulse (!) 105, temperature 98 F (36.7 C), temperature source Oral, resp. rate 17, height 5\' 7"  (1.702 m), weight 71.2 kg (157 lb), SpO2 95 %.  Melissa Chapman is a 31 y.o. female with past medical history significant for anxiety, hypertension complaining of panic attack worsening over the course of last several days. Stated she was started on Lexapro one month ago by primary care physician. She's had 3 days worth of nonbloody, nonbilious, no coffee-ground nausea and vomiting. She also states that she developed migraine several weeks ago which are being treated with Imitrex by PCP. She's been taking Zofran at home with no relief in her emesis. She denies abdominal pain but reports upper abdominal discomfort with esophageal irritation. She denies fevers and chills. She denies suicidal ideation, homicidal ideation. She reports that she is feeling very stressed because she recently bought a house, her boyfriend's son has moved in with them after the biologic mother passed away. Headache is not thunderclap she denies any cervicalgia, loss of consciousness, change in vision, numbness weakness, dysarthria and ataxia. Headache is not exacerbated by Valsalva.  Past Medical History:  Diagnosis Date  . Anxiety and depression   . Essential hypertension   . Pancreatitis     Patient Active Problem List   Diagnosis Date Noted  . Migraine without status migrainosus, not intractable 11/15/2016  . Essential hypertension 10/22/2016  . Anxiety and depression 10/22/2016    No past surgical history on file.  OB History    No data available       Home Medications    Prior to Admission medications   Medication Sig Start Date End Date Taking? Authorizing Provider  amLODipine (NORVASC) 10 MG tablet  Take 1 tablet (10 mg total) by mouth daily. 11/05/16  Yes Doreene Nest, NP  escitalopram (LEXAPRO) 10 MG tablet Take 1 tablet (10 mg total) by mouth daily. 10/22/16  Yes Doreene Nest, NP  hydrochlorothiazide (HYDRODIURIL) 25 MG tablet Take 1 tablet (25 mg total) by mouth daily. 11/05/16  Yes Doreene Nest, NP  ondansetron (ZOFRAN ODT) 4 MG disintegrating tablet Take 1 tablet (4 mg total) by mouth every 8 (eight) hours as needed for nausea or vomiting. 11/15/16  Yes Veryl Speak, FNP  SUMAtriptan (IMITREX) 50 MG tablet Take 1 tablet by mouth at the onset of a headache and may repeat in 2 hours if headache persists or recurs. Patient taking differently: Take 50 mg by mouth every 2 (two) hours as needed for migraine. Take 1 tablet by mouth at the onset of a headache and may repeat in 2 hours if headache persists or recurs. 11/15/16  Yes Veryl Speak, FNP    Family History Family History  Problem Relation Age of Onset  . Depression Mother   . Hypertension Mother   . Hypertension Father   . Diabetes Father   . Depression Sister   . Cancer Paternal Grandmother        Sinus     Social History Social History  Substance Use Topics  . Smoking status: Never Smoker  . Smokeless tobacco: Never Used  . Alcohol use Yes     Allergies   Patient has no known allergies.   Review of Systems Review of Systems  A complete review of systems was obtained and all systems are negative except as noted in the HPI and PMH.    Physical Exam Updated Vital Signs BP 132/81 (BP Location: Left Arm)   Pulse (!) 105   Temp 98 F (36.7 C) (Oral)   Resp 17   Ht 5\' 7"  (1.702 m)   Wt 71.2 kg (157 lb)   SpO2 95%   BMI 24.59 kg/m   Physical Exam  Constitutional: She is oriented to person, place, and time. She appears well-developed and well-nourished. No distress.  HENT:  Head: Normocephalic and atraumatic.  Mouth/Throat: Oropharynx is clear and moist.  Eyes: Pupils are equal,  round, and reactive to light. Conjunctivae and EOM are normal.  No TTP of maxillary or frontal sinuses  No TTP or induration of temporal arteries bilaterally  Neck: Normal range of motion. Neck supple.  FROM to C-spine. Pt can touch chin to chest without discomfort. No TTP of midline cervical spine.   Cardiovascular: Regular rhythm and intact distal pulses.   Tachycardic, regular  Pulmonary/Chest: Effort normal and breath sounds normal. No respiratory distress. She has no wheezes. She has no rales. She exhibits no tenderness.  Abdominal: Soft. Bowel sounds are normal. There is no tenderness.  Musculoskeletal: Normal range of motion. She exhibits no edema or tenderness.  Neurological: She is alert and oriented to person, place, and time. No cranial nerve deficit.  II-Visual fields grossly intact. III/IV/VI-Extraocular movements intact.  Pupils reactive bilaterally. V/VII-Smile symmetric, equal eyebrow raise,  facial sensation intact VIII- Hearing grossly intact IX/X-Normal gag XI-bilateral shoulder shrug XII-midline tongue extension Motor: 5/5 bilaterally with normal tone and bulk Cerebellar: Normal finger-to-nose  and normal heel-to-shin test.   Romberg negative Ambulates with a coordinated gait   Skin: She is not diaphoretic.  Psychiatric: She has a normal mood and affect.  Nursing note and vitals reviewed.    ED Treatments / Results  Labs (all labs ordered are listed, but only abnormal results are displayed) Labs Reviewed  CBC WITH DIFFERENTIAL/PLATELET - Abnormal; Notable for the following:       Result Value   WBC 11.8 (*)    MCH 36.3 (*)    MCHC 36.6 (*)    Platelets 424 (*)    Neutro Abs 9.5 (*)    All other components within normal limits  URINALYSIS, ROUTINE W REFLEX MICROSCOPIC - Abnormal; Notable for the following:    Color, Urine AMBER (*)    APPearance CLOUDY (*)    Glucose, UA 50 (*)    Hgb urine dipstick SMALL (*)    Ketones, ur 5 (*)    Protein, ur 100  (*)    Bacteria, UA MANY (*)    Squamous Epithelial / LPF 6-30 (*)    All other components within normal limits  COMPREHENSIVE METABOLIC PANEL - Abnormal; Notable for the following:    Chloride 90 (*)    Glucose, Bld 135 (*)    BUN 25 (*)    Creatinine, Ser 1.21 (*)    AST 79 (*)    Total Bilirubin 2.4 (*)    GFR calc non Af Amer 59 (*)    Anion gap 19 (*)    All other components within normal limits  LIPASE, BLOOD - Abnormal; Notable for the following:    Lipase 210 (*)    All other components within normal limits  I-STAT BETA HCG BLOOD, ED ([facility], [facility], [facility] ONLY)  I-STAT BETA HCG BLOOD, ED ([facility], [facility], [facility]  ONLY)    EKG  EKG Interpretation None       Radiology No results found.  Procedures Procedures (including critical care time)  Medications Ordered in ED Medications  sodium chloride 0.9 % bolus 1,000 mL (0 mLs Intravenous Stopped 11/21/16 1045)  prochlorperazine (COMPAZINE) injection 10 mg (10 mg Intravenous Given 11/21/16 0925)  diphenhydrAMINE (BENADRYL) injection 25 mg (25 mg Intravenous Given 11/21/16 1005)  ketorolac (TORADOL) 30 MG/ML injection 15 mg (15 mg Intravenous Given 11/21/16 1005)  famotidine (PEPCID) IVPB 20 mg premix (0 mg Intravenous Stopped 11/21/16 1045)  sodium chloride 0.9 % bolus 1,000 mL (0 mLs Intravenous Stopped 11/21/16 1258)  gi cocktail (Maalox,Lidocaine,Donnatal) (30 mLs Oral Given 11/21/16 1132)  HYDROmorphone (DILAUDID) injection 0.5 mg (0.5 mg Intravenous Given 11/21/16 1258)  ondansetron (ZOFRAN) injection 4 mg (4 mg Intravenous Given 11/21/16 1258)     Initial Impression / Assessment and Plan / ED Course  I have reviewed the triage vital signs and the nursing notes.  Pertinent labs & imaging results that were available during my care of the patient were reviewed by me and considered in my medical decision making (see chart for details).     Vitals:   11/21/16 0833 11/21/16 0837 11/21/16 1054  BP: 110/86  132/81  Pulse: (!) 120  (!) 105    Resp: 17  17  Temp: 98 F (36.7 C)    TempSrc: Oral    SpO2: 100%  95%  Weight:  71.2 kg (157 lb)   Height:  5\' 7"  (1.702 m)     Medications  sodium chloride 0.9 % bolus 1,000 mL (0 mLs Intravenous Stopped 11/21/16 1045)  prochlorperazine (COMPAZINE) injection 10 mg (10 mg Intravenous Given 11/21/16 0925)  diphenhydrAMINE (BENADRYL) injection 25 mg (25 mg Intravenous Given 11/21/16 1005)  ketorolac (TORADOL) 30 MG/ML injection 15 mg (15 mg Intravenous Given 11/21/16 1005)  famotidine (PEPCID) IVPB 20 mg premix (0 mg Intravenous Stopped 11/21/16 1045)  sodium chloride 0.9 % bolus 1,000 mL (0 mLs Intravenous Stopped 11/21/16 1258)  gi cocktail (Maalox,Lidocaine,Donnatal) (30 mLs Oral Given 11/21/16 1132)  HYDROmorphone (DILAUDID) injection 0.5 mg (0.5 mg Intravenous Given 11/21/16 1258)  ondansetron (ZOFRAN) injection 4 mg (4 mg Intravenous Given 11/21/16 1258)    Melissa Chapman is 31 y.o. female presenting with Anxiety attack, patient initially tachycardic, this has improved after hydration. No indication for emergent psychiatric intervention at this time. She's also had 3 days of nonbloody, nonbilious, non-coffee ground emesis. Abdominal exam is benign. Nausea has been very difficult to control. Lipase is elevated. She does drink alcohol, this may be in alcoholic pancreatitis, she had a similar episode approximately one year ago she is also under a lot of stress and presumptively drinking more than normal. She has a mild acute kidney injury. She is requiring multiple doses of IV nausea medication, she had Zofran at home which was not enough to control her emesis, given her a cataract and repeated need for IV antiemetics will need admission for pancreatitis. Headache with no red flags, hospitalist admission.    Final Clinical Impressions(s) / ED Diagnoses   Final diagnoses:  Acute pancreatitis, unspecified complication status, unspecified pancreatitis type  Dehydration    New  Prescriptions New Prescriptions   No medications on file     Kaylyn Lim 11/21/16 1303    Lorre Nick, MD 11/22/16 1007

## 2016-11-21 NOTE — ED Notes (Signed)
Pt is c/o right epigastric pain

## 2016-11-21 NOTE — ED Triage Notes (Signed)
Per EMS: Pt was driving her car on the way to work this morning and got really anxious. Pt started having tingling in her hands and arms. Pt found laying down beside her car. Pt had zofran about 0700 this morning. Pt was tachy at HR 120 138/72 CBG 194 ST on the monitor Pt had one round of emesis. Pt had a grape popcicle for breakfast and that's what the emesis had characteristics of.  Pt is not currently on anything for anxiety.

## 2016-11-21 NOTE — ED Notes (Signed)
Pt reports emesis X3. Pt states she was only just recently placed on Lexipro for her anxiety. Pt states she has been having a lot of stress lately and just feels overwhelmed. Pt is fidgeting and unable to be still. Pt is tossing and turning in the bed

## 2016-11-21 NOTE — ED Notes (Signed)
Pt given a hot pack for her abdomen

## 2016-11-21 NOTE — ED Notes (Signed)
ED Provider at bedside. 

## 2016-11-22 ENCOUNTER — Other Ambulatory Visit (HOSPITAL_COMMUNITY): Payer: 59

## 2016-11-22 DIAGNOSIS — I10 Essential (primary) hypertension: Secondary | ICD-10-CM

## 2016-11-22 DIAGNOSIS — F410 Panic disorder [episodic paroxysmal anxiety] without agoraphobia: Secondary | ICD-10-CM | POA: Diagnosis present

## 2016-11-22 DIAGNOSIS — F1010 Alcohol abuse, uncomplicated: Secondary | ICD-10-CM | POA: Diagnosis present

## 2016-11-22 DIAGNOSIS — F329 Major depressive disorder, single episode, unspecified: Secondary | ICD-10-CM

## 2016-11-22 DIAGNOSIS — Z833 Family history of diabetes mellitus: Secondary | ICD-10-CM | POA: Diagnosis not present

## 2016-11-22 DIAGNOSIS — Z8249 Family history of ischemic heart disease and other diseases of the circulatory system: Secondary | ICD-10-CM | POA: Diagnosis not present

## 2016-11-22 DIAGNOSIS — K859 Acute pancreatitis without necrosis or infection, unspecified: Principal | ICD-10-CM

## 2016-11-22 DIAGNOSIS — Z79899 Other long term (current) drug therapy: Secondary | ICD-10-CM | POA: Diagnosis not present

## 2016-11-22 DIAGNOSIS — E876 Hypokalemia: Secondary | ICD-10-CM | POA: Diagnosis present

## 2016-11-22 DIAGNOSIS — F419 Anxiety disorder, unspecified: Secondary | ICD-10-CM | POA: Diagnosis not present

## 2016-11-22 DIAGNOSIS — K852 Alcohol induced acute pancreatitis without necrosis or infection: Secondary | ICD-10-CM | POA: Diagnosis not present

## 2016-11-22 DIAGNOSIS — E872 Acidosis: Secondary | ICD-10-CM | POA: Diagnosis present

## 2016-11-22 DIAGNOSIS — E860 Dehydration: Secondary | ICD-10-CM

## 2016-11-22 DIAGNOSIS — Z818 Family history of other mental and behavioral disorders: Secondary | ICD-10-CM | POA: Diagnosis not present

## 2016-11-22 DIAGNOSIS — R197 Diarrhea, unspecified: Secondary | ICD-10-CM | POA: Diagnosis present

## 2016-11-22 LAB — CBC
HEMATOCRIT: 33.7 % — AB (ref 36.0–46.0)
Hemoglobin: 11.7 g/dL — ABNORMAL LOW (ref 12.0–15.0)
MCH: 35.5 pg — AB (ref 26.0–34.0)
MCHC: 34.7 g/dL (ref 30.0–36.0)
MCV: 102.1 fL — AB (ref 78.0–100.0)
PLATELETS: 251 10*3/uL (ref 150–400)
RBC: 3.3 MIL/uL — ABNORMAL LOW (ref 3.87–5.11)
RDW: 13.8 % (ref 11.5–15.5)
WBC: 11.4 10*3/uL — ABNORMAL HIGH (ref 4.0–10.5)

## 2016-11-22 LAB — HIV ANTIBODY (ROUTINE TESTING W REFLEX): HIV Screen 4th Generation wRfx: NONREACTIVE

## 2016-11-22 LAB — COMPREHENSIVE METABOLIC PANEL
ALT: 19 U/L (ref 14–54)
AST: 33 U/L (ref 15–41)
Albumin: 3.8 g/dL (ref 3.5–5.0)
Alkaline Phosphatase: 105 U/L (ref 38–126)
Anion gap: 12 (ref 5–15)
BUN: 9 mg/dL (ref 6–20)
CHLORIDE: 94 mmol/L — AB (ref 101–111)
CO2: 27 mmol/L (ref 22–32)
CREATININE: 0.61 mg/dL (ref 0.44–1.00)
Calcium: 8.7 mg/dL — ABNORMAL LOW (ref 8.9–10.3)
GFR calc Af Amer: >60 mL/min (ref >60)
GFR calc non Af Amer: >60 mL/min (ref >60)
GLUCOSE: 140 mg/dL — AB (ref 65–99)
Potassium: 2.4 mmol/L — CL (ref 3.5–5.1)
SODIUM: 133 mmol/L — AB (ref 135–145)
Total Bilirubin: 1.8 mg/dL — ABNORMAL HIGH (ref 0.3–1.2)
Total Protein: 7.4 g/dL (ref 6.5–8.1)

## 2016-11-22 LAB — CALCIUM, IONIZED: CALCIUM, IONIZED, SERUM: 4.7 mg/dL (ref 4.5–5.6)

## 2016-11-22 LAB — MAGNESIUM: Magnesium: 1.7 mg/dL (ref 1.7–2.4)

## 2016-11-22 MED ORDER — POTASSIUM CHLORIDE 10 MEQ/100ML IV SOLN
10.0000 meq | Freq: Once | INTRAVENOUS | Status: AC
  Administered 2016-11-22: 10 meq via INTRAVENOUS
  Filled 2016-11-22: qty 100

## 2016-11-22 MED ORDER — MAGNESIUM SULFATE 2 GM/50ML IV SOLN
2.0000 g | Freq: Once | INTRAVENOUS | Status: AC
  Administered 2016-11-22: 2 g via INTRAVENOUS
  Filled 2016-11-22: qty 50

## 2016-11-22 MED ORDER — POTASSIUM CHLORIDE 10 MEQ/100ML IV SOLN
10.0000 meq | INTRAVENOUS | Status: AC
  Administered 2016-11-22 (×3): 10 meq via INTRAVENOUS
  Filled 2016-11-22 (×4): qty 100

## 2016-11-22 NOTE — Progress Notes (Signed)
Spoke w/[place] in [place]. Pt to be NPO after midnight and they will do her [place] first thing tomm am. Will notify pt and night nurse of plan.

## 2016-11-22 NOTE — Progress Notes (Signed)
Received call from [place] in lab w/critical Potassium of 2.4. Paged attending MD, Dr. Edward Jolly.

## 2016-11-22 NOTE — Progress Notes (Signed)
PROGRESS NOTE [facility]   Shacoria Latif   ZOX:[id number] DOB: 01/15/1986  DOA: 11/21/2016 PCP: Doreene Nest, NP   Brief Narrative:  Melissa Chapman is a 31 year old female with past medical history significant for hypertension and anxiety. Presented to the emergency department complaining of abdominal pain nausea and vomiting. Patient was found to have mild alcoholic pancreatitis and was admitted for further management.  Subjective: Patient seen and examined report feeling better than yesterday. Patient wants to eat something. Abdominal pain has decreased, denies nausea or vomiting. She is complaining of diarrhea she has had 4 episodes in the last 12 hours. Patient remains afebrile  Assessment & Plan: Alcoholic pancreatitis - mild Prior history of alcoholic pancreatitis, medication induced also possibility although less likely. Right upper quadrant ultrasound pending Continue IV fluids We'll start clear liquid diet and advance as tolerated Continue pain control as needed Alcohol cessation discussed Check lipase level in the morning  Diarrhea secondary to pancreatitis Monitor for now  Hypokalemia secondary to diarrhea Repleted Check BMP in a.m. Magnesium level in the lower limit will replete   Essential hypertension HCTZ discontinued given the association with pancreatitis  Was continued on amlodipine only and BP stable Continue to monitor blood pressure if another medication needed consider Ace inhibitors  Alcohol abuse Patient was placed on CIWA protocol No signs of withdrawal at this time [facility] consult social worker Alcohol cessation discussed  DVT prophylaxis: Lovenox Code Status: Full code Family Communication: None at bedside Disposition Plan: Home in the next 24-48 hour if patient continues to tolerate diet well  Consultants:   None  Procedures:   None  Antimicrobials:  None    Objective: Vitals:   11/21/16 1852 11/21/16 1959  11/22/16 0240 11/22/16 0600  BP: 135/76 (!) 145/94 (!) 143/95 (!) 150/90  Pulse: (!) 101 (!) 105 (!) 108 (!) 111  Resp: 15 18 18 18   Temp: 98.7 F (37.1 C) 98.6 F (37 C) 98.7 F (37.1 C) 98.4 F (36.9 C)  TempSrc: Oral Oral Oral Oral  SpO2: 99% 100% 99% 100%  Weight:      Height:        Intake/Output Summary (Last 24 hours) at 11/22/16 1118 Last data filed at 11/22/16 0600  Gross per 24 hour  Intake          2627.08 ml  Output              700 ml  Net          1927.08 ml   Filed Weights   11/21/16 0837  Weight: 71.2 kg (157 lb)    Examination:  General exam: Appears calm and comfortable  HEENT: AC/AT, PERRLA, OP moist and clear Respiratory system: Clear to auscultation. No wheezes,crackle or rhonchi Cardiovascular system: S1 & S2 heard, RRR. No JVD, murmurs, rubs or gallops Gastrointestinal system: Abdomen is nondistended, soft and nontender. No organomegaly or masses felt. Normal bowel sounds heard. Central nervous system: Alert and oriented. No focal neurological deficits. Extremities: No pedal edema. Symmetric, strength 5/5   Skin: No rashes, lesions or ulcers Psychiatry: Judgement and insight appear normal. Mood & affect appropriate.    Data Reviewed: I have personally reviewed following labs and imaging studies  CBC:  Recent Labs Lab 11/21/16 0900 11/22/16 0728  WBC 11.8* 11.4*  NEUTROABS 9.5*  --   HGB 14.1 11.7*  HCT 38.5 33.7*  MCV 99.2 102.1*  PLT 424* 251   Basic Metabolic Panel:  Recent Labs Lab 11/21/16 0941 11/22/16  0728  NA 135 133*  K 4.4 2.4*  CL 90* 94*  CO2 26 27  GLUCOSE 135* 140*  BUN 25* 9  CREATININE 1.21* 0.61  CALCIUM 8.9 8.7*  MG  --  1.7   GFR: Estimated Creatinine Clearance: 99.1 mL/min (by C-G formula based on SCr of 0.61 mg/dL). Liver Function Tests:  Recent Labs Lab 11/21/16 0941 11/22/16 0728  AST 79* 33  ALT 29 19  ALKPHOS 108 105  BILITOT 2.4* 1.8*  PROT 8.1 7.4  ALBUMIN 4.3 3.8    Recent  Labs Lab 11/21/16 0941  LIPASE 210*   No results for input(s): AMMONIA in the last 168 hours. Coagulation Profile: No results for input(s): INR, PROTIME in the last 168 hours. Cardiac Enzymes: No results for input(s): CKTOTAL, CKMB, CKMBINDEX, TROPONINI in the last 168 hours. BNP (last 3 results) No results for input(s): PROBNP in the last 8760 hours. HbA1C: No results for input(s): HGBA1C in the last 72 hours. CBG: No results for input(s): GLUCAP in the last 168 hours. Lipid Profile:  Recent Labs  11/21/16 0941  CHOL 138  HDL 66  LDLCALC UNABLE TO CALCULATE IF TRIGLYCERIDE OVER 400 mg/dL  TRIG 202*  CHOLHDL 2.1   Thyroid Function Tests: No results for input(s): TSH, T4TOTAL, FREET4, T3FREE, THYROIDAB in the last 72 hours. Anemia Panel: No results for input(s): VITAMINB12, FOLATE, FERRITIN, TIBC, IRON, RETICCTPCT in the last 72 hours. Sepsis Labs: No results for input(s): PROCALCITON, LATICACIDVEN in the last 168 hours.  No results found for this or any previous visit (from the past 240 hour(s)).    Radiology Studies: No results found.    Scheduled Meds: . amLODipine  10 mg Oral Daily  . escitalopram  10 mg Oral Daily  . folic acid  1 mg Oral Daily  . ondansetron (ZOFRAN) IV  4 mg Intravenous Once  . thiamine  100 mg Oral Daily   Continuous Infusions: . sodium chloride 125 mL/hr at 11/21/16 1659  . potassium chloride 10 mEq (11/22/16 1055)     LOS: 0 days    Time spent: Total of 25 minutes spent with pt, greater than 50% of which was spent in discussion of  treatment, counseling and coordination of care    Latrelle Dodrill, MD Pager: Text Page via [email or web address]   If 7PM-7AM, please contact night-coverage [email or web address] 11/22/2016, 11:18 AM

## 2016-11-23 ENCOUNTER — Inpatient Hospital Stay (HOSPITAL_COMMUNITY): Payer: 59

## 2016-11-23 LAB — COMPREHENSIVE METABOLIC PANEL
ALBUMIN: 3.1 g/dL — AB (ref 3.5–5.0)
ALK PHOS: 89 U/L (ref 38–126)
ALT: 14 U/L (ref 14–54)
AST: 19 U/L (ref 15–41)
Anion gap: 7 (ref 5–15)
BUN: 5 mg/dL — ABNORMAL LOW (ref 6–20)
CHLORIDE: 99 mmol/L — AB (ref 101–111)
CO2: 28 mmol/L (ref 22–32)
CREATININE: 0.53 mg/dL (ref 0.44–1.00)
Calcium: 8.5 mg/dL — ABNORMAL LOW (ref 8.9–10.3)
GFR calc non Af Amer: >60 mL/min (ref >60)
GLUCOSE: 131 mg/dL — AB (ref 65–99)
Potassium: 2.6 mmol/L — CL (ref 3.5–5.1)
SODIUM: 134 mmol/L — AB (ref 135–145)
Total Bilirubin: 1.3 mg/dL — ABNORMAL HIGH (ref 0.3–1.2)
Total Protein: 6.7 g/dL (ref 6.5–8.1)

## 2016-11-23 LAB — CBC
HEMATOCRIT: 30.3 % — AB (ref 36.0–46.0)
Hemoglobin: 10.7 g/dL — ABNORMAL LOW (ref 12.0–15.0)
MCH: 35.8 pg — ABNORMAL HIGH (ref 26.0–34.0)
MCHC: 35.3 g/dL (ref 30.0–36.0)
MCV: 101.3 fL — ABNORMAL HIGH (ref 78.0–100.0)
Platelets: 222 10*3/uL (ref 150–400)
RBC: 2.99 MIL/uL — AB (ref 3.87–5.11)
RDW: 13.8 % (ref 11.5–15.5)
WBC: 8.6 10*3/uL (ref 4.0–10.5)

## 2016-11-23 LAB — MAGNESIUM: MAGNESIUM: 2 mg/dL (ref 1.7–2.4)

## 2016-11-23 LAB — LIPASE, BLOOD: LIPASE: 40 U/L (ref 11–51)

## 2016-11-23 MED ORDER — FOLIC ACID 1 MG PO TABS: 1.0000 mg | ORAL_TABLET | Freq: Every day | ORAL | 0 refills | Status: DC

## 2016-11-23 MED ORDER — POTASSIUM CHLORIDE 10 MEQ/100ML IV SOLN
10.0000 meq | INTRAVENOUS | Status: DC
  Filled 2016-11-23 (×3): qty 100

## 2016-11-23 MED ORDER — THIAMINE HCL 100 MG PO TABS: 100.0000 mg | ORAL_TABLET | Freq: Every day | ORAL | 0 refills | Status: DC

## 2016-11-23 MED ORDER — POTASSIUM CHLORIDE ER 20 MEQ PO TBCR: 20.0000 meq | EXTENDED_RELEASE_TABLET | Freq: Every day | ORAL | 0 refills | Status: DC

## 2016-11-23 NOTE — Discharge Summary (Signed)
AMA  Patient at this time expresses desire to leave the Hospital immidiately, patient has been warned that this is not Medically advisable at this time, and can result in Medical complications like Death and Disability, patient understands and accepts the risks involved and assumes full responsibilty of this decision.  Patient potassium was critically low to safely discharged with IV replacement, patient insisted to leave because she had things to do. Nurse and I explained risks and patient stated that she will follow with her primary care doctor.   Melissa Chapman M.D on 11/23/2016 at 6:33 PM  [facility]  Time < 30 minutes  Last Note Below   PROGRESS NOTE [facility]   Melissa Chapman           ZOX:[id number] DOB: 21-Oct-1985  DOA: 11/21/2016 PCP: Melissa Nest, NP    Brief Narrative:  Melissa Chapman is a 31 year old female with past medical history significant for hypertension and anxiety. Presented to the emergency department complaining of abdominal pain nausea and vomiting. Patient was found to have mild alcoholic pancreatitis and was admitted for further management.  Subjective: Patient seen and examined report feeling better than yesterday. Patient wants to eat something. Abdominal pain has decreased, denies nausea or vomiting. She is complaining of diarrhea she has had 4 episodes in the last 12 hours. Patient remains afebrile  Assessment & Plan: Alcoholic pancreatitis - mild Prior history of alcoholic pancreatitis, medication induced also possibility although less likely. Right upper quadrant ultrasound pending Continue IV fluids We'll start clear liquid diet and advance as tolerated Continue pain control as needed Alcohol cessation discussed Check lipase level in the morning  Diarrhea secondary to  pancreatitis Monitor for now  Hypokalemia secondary to diarrhea Repleted Check BMP in a.m. Magnesium level in the lower limit will replete   Essential hypertension HCTZ discontinued given the association with pancreatitis  Was continued on amlodipine only and BP stable Continue to monitor blood pressure if another medication needed consider Ace inhibitors  Alcohol abuse Patient was placed on CIWA protocol No signs of withdrawal at this time [facility] consult social worker Alcohol cessation discussed  DVT prophylaxis: Lovenox Code Status: Full code Family Communication: None at bedside Disposition Plan: Home in the next 24-48 hour if patient continues to tolerate diet well  Consultants:   None  Procedures:   None  Antimicrobials:  None    Objective: Vitals:   11/21/16 1852 11/21/16 1959 11/22/16 0240 11/22/16 0600  BP: 135/76 (!) 145/94 (!) 143/95 (!) 150/90  Pulse: (!) 101 (!) 105 (!) 108 (!) 111  Resp: 15 18 18 18   Temp: 98.7 F (37.1 C) 98.6 F (37 C) 98.7 F (37.1 C) 98.4 F (36.9 C)  TempSrc: Oral Oral Oral Oral  SpO2: 99% 100% 99% 100%  Weight:      Height:        Intake/Output Summary (Last 24 hours) at 11/22/16 1118 Last data filed at 11/22/16 0600  Gross per 24 hour  Intake          2627.08 ml  Output              700 ml  Net  1927.08 ml      Filed Weights   11/21/16 0837  Weight: 71.2 kg (157 lb)    Examination:  General exam: Appears calm and comfortable  HEENT: AC/AT, PERRLA, OP moist and clear Respiratory system: Clear to auscultation. No wheezes,crackle or rhonchi Cardiovascular system: S1 & S2 heard, RRR. No JVD, murmurs, rubs or gallops Gastrointestinal system: Abdomen is nondistended, soft and nontender. No organomegaly or masses felt. Normal bowel sounds heard. Central nervous system: Alert and oriented. No focal neurological deficits. Extremities: No pedal edema. Symmetric, strength 5/5   Skin:  No rashes, lesions or ulcers Psychiatry: Judgement and insight appear normal. Mood & affect appropriate.    Data Reviewed: I have personally reviewed following labs and imaging studies  CBC:  Last Labs    Recent Labs Lab 11/21/16 0900 11/22/16 0728  WBC 11.8* 11.4*  NEUTROABS 9.5*  --   HGB 14.1 11.7*  HCT 38.5 33.7*  MCV 99.2 102.1*  PLT 424* 251     Basic Metabolic Panel:  Last Labs    Recent Labs Lab 11/21/16 0941 11/22/16 0728  NA 135 133*  K 4.4 2.4*  CL 90* 94*  CO2 26 27  GLUCOSE 135* 140*  BUN 25* 9  CREATININE 1.21* 0.61  CALCIUM 8.9 8.7*  MG  --  1.7     GFR: Estimated Creatinine Clearance: 99.1 mL/min (by C-G formula based on SCr of 0.61 mg/dL). Liver Function Tests:  Last Labs    Recent Labs Lab 11/21/16 0941 11/22/16 0728  AST 79* 33  ALT 29 19  ALKPHOS 108 105  BILITOT 2.4* 1.8*  PROT 8.1 7.4  ALBUMIN 4.3 3.8      Last Labs    Recent Labs Lab 11/21/16 0941  LIPASE 210*     Last Labs   No results for input(s): AMMONIA in the last 168 hours.   Coagulation Profile: Last Labs   No results for input(s): INR, PROTIME in the last 168 hours.   Cardiac Enzymes: Last Labs   No results for input(s): CKTOTAL, CKMB, CKMBINDEX, TROPONINI in the last 168 hours.   BNP (last 3 results) Recent Labs (within last 365 days)  No results for input(s): PROBNP in the last 8760 hours.   HbA1C: Recent Labs (last 2 labs)   No results for input(s): HGBA1C in the last 72 hours.   CBG: Last Labs   No results for input(s): GLUCAP in the last 168 hours.   Lipid Profile:  Recent Labs (last 2 labs)    Recent Labs  11/21/16 0941  CHOL 138  HDL 66  LDLCALC UNABLE TO CALCULATE IF TRIGLYCERIDE OVER 400 mg/dL  TRIG 161*  CHOLHDL 2.1     Thyroid Function Tests: Recent Labs (last 2 labs)   No results for input(s): TSH, T4TOTAL, FREET4, T3FREE, THYROIDAB in the last 72 hours.   Anemia Panel: Recent Labs (last 2 labs)   No  results for input(s): VITAMINB12, FOLATE, FERRITIN, TIBC, IRON, RETICCTPCT in the last 72 hours.   Sepsis Labs: Last Labs   No results for input(s): PROCALCITON, LATICACIDVEN in the last 168 hours.    No results found for this or any previous visit (from the past 240 hour(s)).    Radiology Studies: Imaging Results (Last 48 hours)  No results found.      Scheduled Meds: . amLODipine  10 mg Oral Daily  . escitalopram  10 mg Oral Daily  . folic acid  1 mg Oral Daily  . ondansetron (  ZOFRAN) IV  4 mg Intravenous Once  . thiamine  100 mg Oral Daily   Continuous Infusions: . sodium chloride 125 mL/hr at 11/21/16 1659  . potassium chloride 10 mEq (11/22/16 1055)     LOS: 0 days    Time spent: Total of 25 minutes spent with pt, greater than 50% of which was spent in discussion of  treatment, counseling and coordination of care    Melissa Dodrill, MD Pager: Text Page via [email or web address]   If 7PM-7AM, please contact night-coverage [email or web address] 11/22/2016, 11:18 AM

## 2016-11-23 NOTE — Progress Notes (Signed)
Pt had orders for potassium to be infused and then lost her IV access. She wanted orders to be changed to oral instead of waiting for three runs of K to infuse. I paged MD, he stated her K was too low to safely discharge the patient. I notified the patient of his decision. Melissa Chapman decided to leave AMA. I warned her that insurance may not pay for hospital stay she stated " that's ok" and continued to sign the AMA papers.

## 2016-11-23 NOTE — Progress Notes (Signed)
Checked freq, resting quietly at long intervals with eyes closed, resp even and unlabored. Awakens easily to verbal stimuli. S.O. Resting in bed with patient. No acute distress noted. Remains NPO for abd ultrasound which is reported by patient to be completed today.

## 2016-11-26 ENCOUNTER — Ambulatory Visit: Payer: 59 | Admitting: Primary Care

## 2016-11-27 ENCOUNTER — Ambulatory Visit (INDEPENDENT_AMBULATORY_CARE_PROVIDER_SITE_OTHER): Payer: 59 | Admitting: Primary Care

## 2016-11-27 ENCOUNTER — Ambulatory Visit: Payer: 59 | Admitting: Primary Care

## 2016-11-27 ENCOUNTER — Other Ambulatory Visit: Payer: Self-pay | Admitting: Primary Care

## 2016-11-27 ENCOUNTER — Encounter: Payer: Self-pay | Admitting: Primary Care

## 2016-11-27 ENCOUNTER — Telehealth: Payer: Self-pay

## 2016-11-27 VITALS — BP 126/76 | HR 114 | Temp 98.0°F | Ht 67.0 in | Wt 154.0 lb

## 2016-11-27 DIAGNOSIS — E876 Hypokalemia: Secondary | ICD-10-CM | POA: Diagnosis not present

## 2016-11-27 DIAGNOSIS — K859 Acute pancreatitis without necrosis or infection, unspecified: Secondary | ICD-10-CM

## 2016-11-27 DIAGNOSIS — F419 Anxiety disorder, unspecified: Secondary | ICD-10-CM | POA: Diagnosis not present

## 2016-11-27 DIAGNOSIS — F329 Major depressive disorder, single episode, unspecified: Secondary | ICD-10-CM

## 2016-11-27 DIAGNOSIS — I10 Essential (primary) hypertension: Secondary | ICD-10-CM | POA: Diagnosis not present

## 2016-11-27 LAB — COMPREHENSIVE METABOLIC PANEL
ALK PHOS: 91 U/L (ref 39–117)
ALT: 17 U/L (ref 0–35)
AST: 23 U/L (ref 0–37)
Albumin: 3.9 g/dL (ref 3.5–5.2)
BUN: 5 mg/dL — ABNORMAL LOW (ref 6–23)
CO2: 29 meq/L (ref 19–32)
Calcium: 9.4 mg/dL (ref 8.4–10.5)
Chloride: 97 mEq/L (ref 96–112)
Creatinine, Ser: 0.61 mg/dL (ref 0.40–1.20)
GFR: 146.54 mL/min (ref >60.00)
GLUCOSE: 150 mg/dL — AB (ref 70–99)
POTASSIUM: 2.3 meq/L — AB (ref 3.5–5.1)
Sodium: 136 mEq/L (ref 135–145)
TOTAL PROTEIN: 7.5 g/dL (ref 6.0–8.3)
Total Bilirubin: 0.2 mg/dL (ref 0.2–1.2)

## 2016-11-27 LAB — LIPASE: LIPASE: 74 U/L — AB (ref 11.0–59.0)

## 2016-11-27 MED ORDER — POTASSIUM CHLORIDE CRYS ER 20 MEQ PO TBCR: 40.0000 meq | EXTENDED_RELEASE_TABLET | Freq: Three times a day (TID) | ORAL | 0 refills | Status: DC

## 2016-11-27 NOTE — Patient Instructions (Signed)
Continue Amlodipine 10 mg tablets for high blood pressure.   Please notify me if you start noticing blood pressure readings at or above 135/90 on a consistent basis.  Complete lab work prior to leaving today. I will notify you of your results once received.   It was a pleasure to see you today!

## 2016-11-27 NOTE — Telephone Encounter (Signed)
Received critical potassium 2.3, verbally notified Vernona Rieger.

## 2016-11-27 NOTE — Progress Notes (Signed)
Subjective:    Patient ID: Melissa Chapman, female    DOB: October 01, 1985, 31 y.o.   MRN: [id number]  HPI  Melissa Chapman is a 31 year old female with a history of hypertension, pancreatitis who presents today for hospital follow up.  She presented to [facility] on 11/21/16 with a chief complaint of anxiety. She also reported three days of non-bloody, non-bilious nausea and vomiting without improvement with oral Zofran at home. During her stay in the ED she was treated with IV fluids and anti-nausea medication. Labs elevated lipase,bilirubin, and mild acute kidney injury. She was suspected to have acute pancreatitis and was admitted for monitoring and further treatment.  During her hospital stay she admitted to drinking three beers daily. This was presumed not to be the cause of her pancreatitis. Her She was removed from her HCTZ given the risk for pancreatitis, her amlodipine was continued. She was initiated on the CIWA protocal for alcohol abuse. Ultrasound for for RUQ preformed which showed stable gall bladder distension and adenomyomatosis. She decided to leave against medical advice on 11/23/16 despite recommendations to stay. She was noted to have a critically low potassium level that required IV replacement, she declined treatment.   Since her hospital stay she's been monitoring her blood pressure. Readings range from 120-130's/80's mostly. She's compliant to her amlodipine 10 mg. She's feeling better as her abdominal pain has improved and her anxiety has improved. She feels well managed on Lexapro and states that her family has noticed an improvement in her mood. She's is drinking 2 beers daily. She denies abdominal pain, diarrhea, constipation, headaches, SI/HI.  It was recommended on 11/05/16 for her to stop HCTZ given hypokalemia. Several attempts were made to contact patient including phone and mail.   Review of Systems  Constitutional: Negative for fever.  Respiratory: Negative for shortness of  breath.   Cardiovascular: Negative for chest pain.  Gastrointestinal: Negative for abdominal pain, constipation, diarrhea, nausea and vomiting.  Neurological: Negative for headaches.  Psychiatric/Behavioral: Negative for suicidal ideas. The patient is not nervous/anxious.        Past Medical History:  Diagnosis Date  . Anxiety and depression   . Essential hypertension   . Pancreatitis      Social History   Social History  . Marital status: Single    Spouse name: N/A  . Number of children: N/A  . Years of education: N/A   Occupational History  . Not on file.   Social History Main Topics  . Smoking status: Never Smoker  . Smokeless tobacco: Never Used  . Alcohol use Yes  . Drug use: No  . Sexual activity: Yes   Other Topics Concern  . Not on file   Social History Narrative   Single.    1 son.   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    No past surgical history on file.  Family History  Problem Relation Age of Onset  . Depression Mother   . Hypertension Mother   . Hypertension Father   . Diabetes Father   . Depression Sister   . Cancer Paternal Grandmother        Sinus     No Known Allergies  Current Outpatient Prescriptions on File Prior to Visit  Medication Sig Dispense Refill  . amLODipine (NORVASC) 10 MG tablet Take 1 tablet (10 mg total) by mouth daily. 90 tablet 2  . escitalopram (LEXAPRO) 10 MG tablet Take 1 tablet (10  mg total) by mouth daily. 30 tablet 1  . ondansetron (ZOFRAN ODT) 4 MG disintegrating tablet Take 1 tablet (4 mg total) by mouth every 8 (eight) hours as needed for nausea or vomiting. 20 tablet 0  . SUMAtriptan (IMITREX) 50 MG tablet Take 1 tablet by mouth at the onset of a headache and may repeat in 2 hours if headache persists or recurs. (Patient taking differently: Take 50 mg by mouth every 2 (two) hours as needed for migraine. Take 1 tablet by mouth at the onset of a headache and may repeat in 2 hours  if headache persists or recurs.) 10 tablet 0   No current facility-administered medications on file prior to visit.     BP 126/76   Pulse (!) 114   Temp 98 F (36.7 C) (Oral)   Ht 5\' 7"  (1.702 m)   Wt 154 lb (69.9 kg)   SpO2 97%   BMI 24.12 kg/m    Objective:   Physical Exam  Constitutional: She appears well-nourished.  Neck: Neck supple.  Cardiovascular: Normal rate and regular rhythm.   Pulmonary/Chest: Effort normal and breath sounds normal.  Abdominal: Soft. Bowel sounds are normal. There is no tenderness.  Skin: Skin is warm and dry.  Psychiatric: She has a normal mood and affect.          Assessment & Plan:  Hospital Follow Up:  Presented for panic attack, diagnosed with acute pancreatitis. Admitted for treatment of n/v/abdominal pain and acute pancreatitis. Left AMA on 11/23/16. Exam today unremarkable, appears very well. Discussed to stop drinking alcohol. Check CMP today to evaluate for LFT's and hypokalemia.  All hospital notes, imaging, and notes reviewed. Morrie Sheldon, NP

## 2016-11-27 NOTE — Assessment & Plan Note (Signed)
Attempted to contact patient numerous times to stop HCTZ, she did not return our calls. She did verify today that she received our letter.   Continue on Amlodipine 10 mg, blood pressure is stable on this alone. If she needs additional medication then would recommend lisinopril given history of hypokalemia.  She will continue to monitor BP and notify for readings at or above 135/90. CMP pending today.

## 2016-11-27 NOTE — Telephone Encounter (Signed)
Patient notified and has been treated. She refuses hospital treatment as recommended. Repeat potassium pending for this Friday.

## 2016-11-27 NOTE — Assessment & Plan Note (Signed)
Improved on Lexapro. Denies SI/HI. Continue same.

## 2016-11-29 ENCOUNTER — Telehealth: Payer: Self-pay

## 2016-11-29 MED FILL — ESCITALOPRAM 10 MG TABLET: 10 | 30 days supply | Qty: 30 | Fill #1

## 2016-11-29 NOTE — Telephone Encounter (Signed)
Noted. Lab appt has been re-schedule for 12/04/16 at 4 pm.

## 2016-11-29 NOTE — Telephone Encounter (Signed)
Pt returned your call. She will be here Tues 9/4 at 4pm. She understands if she has any of the mentioned symptoms she will go to the ER.

## 2016-11-29 NOTE — Telephone Encounter (Signed)
Message left for patient to return my call.  

## 2016-11-29 NOTE — Telephone Encounter (Signed)
Pt was seen 11/27/16 and has lab appt on 11/30/16 to reck K. Pt did not start K until last night; pt wants to know if should change lab appt to later date. Pt request cb.

## 2016-11-29 NOTE — Telephone Encounter (Signed)
Yes, okay to schedule for Tuesday next week when our office opens. Please have her go straight to the hospital if she develops muscle weakness, shortness of breath, nausea/vomiting, stomach distention.

## 2016-11-30 ENCOUNTER — Other Ambulatory Visit: Payer: 59

## 2016-12-04 ENCOUNTER — Other Ambulatory Visit (INDEPENDENT_AMBULATORY_CARE_PROVIDER_SITE_OTHER): Payer: 59

## 2016-12-04 ENCOUNTER — Ambulatory Visit: Payer: 59 | Admitting: Primary Care

## 2016-12-04 DIAGNOSIS — E876 Hypokalemia: Secondary | ICD-10-CM

## 2016-12-04 DIAGNOSIS — I10 Essential (primary) hypertension: Secondary | ICD-10-CM | POA: Diagnosis not present

## 2016-12-05 ENCOUNTER — Telehealth: Payer: Self-pay | Admitting: Primary Care

## 2016-12-05 ENCOUNTER — Other Ambulatory Visit: Payer: Self-pay | Admitting: Primary Care

## 2016-12-05 DIAGNOSIS — E876 Hypokalemia: Secondary | ICD-10-CM

## 2016-12-05 LAB — BASIC METABOLIC PANEL
BUN: 6 mg/dL (ref 6–23)
CALCIUM: 9.2 mg/dL (ref 8.4–10.5)
CO2: 21 mEq/L (ref 19–32)
Chloride: 102 mEq/L (ref 96–112)
Creatinine, Ser: 0.78 mg/dL (ref 0.40–1.20)
GFR: 110.33 mL/min (ref >60.00)
Glucose, Bld: 147 mg/dL — ABNORMAL HIGH (ref 70–99)
Potassium: 4.1 mEq/L (ref 3.5–5.1)
SODIUM: 135 meq/L (ref 135–145)

## 2016-12-05 LAB — HEMOGLOBIN A1C: Hgb A1c MFr Bld: 5.8 % (ref 4.6–6.5)

## 2016-12-05 LAB — TSH: TSH: 1.32 u[IU]/mL (ref 0.35–4.50)

## 2016-12-05 NOTE — Telephone Encounter (Signed)
Patient called to get her lab results. 

## 2016-12-05 NOTE — Telephone Encounter (Signed)
See results note. 

## 2016-12-06 NOTE — Telephone Encounter (Signed)
Pt returned your call.  

## 2016-12-08 LAB — ALDOSTERONE + RENIN ACTIVITY W/ RATIO
ALDO / PRA RATIO: 1.5 ratio (ref 0.9–28.9)
Aldosterone: 21 ng/dL
PRA LC/MS/MS: 13.95 ng/mL/h — ABNORMAL HIGH (ref 0.25–5.82)

## 2016-12-17 ENCOUNTER — Other Ambulatory Visit: Payer: Self-pay | Admitting: Primary Care

## 2016-12-17 DIAGNOSIS — E876 Hypokalemia: Secondary | ICD-10-CM

## 2016-12-25 ENCOUNTER — Other Ambulatory Visit (INDEPENDENT_AMBULATORY_CARE_PROVIDER_SITE_OTHER): Payer: 59

## 2016-12-25 DIAGNOSIS — E876 Hypokalemia: Secondary | ICD-10-CM

## 2016-12-26 LAB — POTASSIUM: Potassium: 3.3 mEq/L — ABNORMAL LOW (ref 3.5–5.1)

## 2016-12-30 LAB — ALDOSTERONE + RENIN ACTIVITY W/ RATIO: RENIN ACTIVITY: 2.29 ng/mL/h (ref 0.25–5.82)

## 2017-01-08 ENCOUNTER — Encounter: Payer: Self-pay | Admitting: *Deleted

## 2017-01-17 MED FILL — AMLODIPINE BESYLATE 10 MG T: 10 | 90 days supply | Qty: 90 | Fill #0

## 2017-02-09 DIAGNOSIS — H5213 Myopia, bilateral: Secondary | ICD-10-CM | POA: Diagnosis not present

## 2017-02-11 ENCOUNTER — Other Ambulatory Visit: Payer: Self-pay | Admitting: Primary Care

## 2017-02-11 DIAGNOSIS — F419 Anxiety disorder, unspecified: Principal | ICD-10-CM

## 2017-02-11 DIAGNOSIS — F32A Depression, unspecified: Secondary | ICD-10-CM

## 2017-02-11 DIAGNOSIS — E876 Hypokalemia: Secondary | ICD-10-CM

## 2017-02-11 DIAGNOSIS — F329 Major depressive disorder, single episode, unspecified: Secondary | ICD-10-CM

## 2017-02-11 NOTE — Telephone Encounter (Signed)
Please call patient: has she been taking Lexapro daily? Looks like she would have run out of refills in late September 2018. If you can't get a hold of the patient then call the pharmacy to see when her last fill date was. Please also see if patient received our letter that was sent on 01/08/17.

## 2017-02-11 NOTE — Telephone Encounter (Signed)
Received faxed refill request for   escitalopram (LEXAPRO) 10 MG tablet  Last prescribed on 10/30/2016. Last seen on 11/27/2016

## 2017-02-14 NOTE — Telephone Encounter (Signed)
Message left for patient to return my call.  

## 2017-02-15 NOTE — Telephone Encounter (Signed)
Spoken to patient. She stated that she is taking the Lexapro daily. Also asked about the results that was sent in a letter on 01/08/2017. Patient stated that she did not see it.  Well patient is agreeable to the referral to nephrologist.  Please refill Lexapro and send the potassium to the pharmacy.

## 2017-02-17 MED ORDER — POTASSIUM CHLORIDE CRYS ER 20 MEQ PO TBCR: 20.0000 meq | EXTENDED_RELEASE_TABLET | Freq: Every day | ORAL | 0 refills | Status: DC

## 2017-02-17 NOTE — Telephone Encounter (Signed)
Noted, please schedule her for a lab only appointment 2 weeks after she stars the potassium supplements.  Refills sent to pharmacy, referral placed.

## 2017-02-18 MED FILL — ESCITALOPRAM 10 MG TABLET: 10 | 90 days supply | Qty: 90 | Fill #0

## 2017-02-18 MED FILL — POTASSIUM CL ER 20 MEQ TAB: 20 | 90 days supply | Qty: 90 | Fill #0

## 2017-02-18 NOTE — Telephone Encounter (Signed)
Lab appointment scheduled 

## 2017-02-28 ENCOUNTER — Other Ambulatory Visit: Payer: Self-pay | Admitting: Primary Care

## 2017-02-28 DIAGNOSIS — E876 Hypokalemia: Secondary | ICD-10-CM

## 2017-03-05 ENCOUNTER — Other Ambulatory Visit: Payer: 59

## 2017-03-22 ENCOUNTER — Encounter: Payer: Self-pay | Admitting: Primary Care

## 2017-08-21 ENCOUNTER — Encounter: Payer: 59 | Admitting: Primary Care

## 2017-08-29 ENCOUNTER — Other Ambulatory Visit: Payer: Self-pay | Admitting: Primary Care

## 2017-08-29 DIAGNOSIS — R7303 Prediabetes: Secondary | ICD-10-CM

## 2017-08-29 DIAGNOSIS — I10 Essential (primary) hypertension: Secondary | ICD-10-CM

## 2017-09-02 ENCOUNTER — Other Ambulatory Visit (INDEPENDENT_AMBULATORY_CARE_PROVIDER_SITE_OTHER): Payer: 59

## 2017-09-02 DIAGNOSIS — I10 Essential (primary) hypertension: Secondary | ICD-10-CM

## 2017-09-02 DIAGNOSIS — R7303 Prediabetes: Secondary | ICD-10-CM | POA: Diagnosis not present

## 2017-09-02 LAB — COMPREHENSIVE METABOLIC PANEL
ALBUMIN: 3.8 g/dL (ref 3.5–5.2)
ALK PHOS: 70 U/L (ref 39–117)
ALT: 12 U/L (ref 0–35)
AST: 25 U/L (ref 0–37)
BILIRUBIN TOTAL: 0.5 mg/dL (ref 0.2–1.2)
BUN: 5 mg/dL — ABNORMAL LOW (ref 6–23)
CALCIUM: 8.5 mg/dL (ref 8.4–10.5)
CO2: 29 mEq/L (ref 19–32)
CREATININE: 0.73 mg/dL (ref 0.40–1.20)
Chloride: 103 mEq/L (ref 96–112)
GFR: 118.53 mL/min (ref >60.00)
Glucose, Bld: 148 mg/dL — ABNORMAL HIGH (ref 70–99)
Potassium: 3.2 mEq/L — ABNORMAL LOW (ref 3.5–5.1)
Sodium: 141 mEq/L (ref 135–145)
TOTAL PROTEIN: 7.1 g/dL (ref 6.0–8.3)

## 2017-09-02 LAB — HEMOGLOBIN A1C: HEMOGLOBIN A1C: 5.6 % (ref 4.6–6.5)

## 2017-09-02 LAB — LIPID PANEL
Cholesterol: 121 mg/dL (ref 0–200)
HDL: 58.5 mg/dL (ref >39.00)
Total CHOL/HDL Ratio: 2
Triglycerides: 510 mg/dL — ABNORMAL HIGH (ref 0.0–149.0)

## 2017-09-02 LAB — LDL CHOLESTEROL, DIRECT: LDL DIRECT: 44 mg/dL

## 2017-09-06 ENCOUNTER — Encounter: Payer: 59 | Admitting: Primary Care

## 2017-10-11 ENCOUNTER — Ambulatory Visit (INDEPENDENT_AMBULATORY_CARE_PROVIDER_SITE_OTHER): Payer: 59 | Admitting: Primary Care

## 2017-10-11 ENCOUNTER — Encounter: Payer: Self-pay | Admitting: Primary Care

## 2017-10-11 VITALS — BP 134/74 | HR 102 | Temp 98.1°F | Ht 66.75 in | Wt 147.5 lb

## 2017-10-11 DIAGNOSIS — F419 Anxiety disorder, unspecified: Secondary | ICD-10-CM | POA: Diagnosis not present

## 2017-10-11 DIAGNOSIS — Z23 Encounter for immunization: Secondary | ICD-10-CM

## 2017-10-11 DIAGNOSIS — I10 Essential (primary) hypertension: Secondary | ICD-10-CM | POA: Diagnosis not present

## 2017-10-11 DIAGNOSIS — E876 Hypokalemia: Secondary | ICD-10-CM

## 2017-10-11 DIAGNOSIS — Z0000 Encounter for general adult medical examination without abnormal findings: Secondary | ICD-10-CM | POA: Diagnosis not present

## 2017-10-11 DIAGNOSIS — F329 Major depressive disorder, single episode, unspecified: Secondary | ICD-10-CM | POA: Diagnosis not present

## 2017-10-11 DIAGNOSIS — E781 Pure hyperglyceridemia: Secondary | ICD-10-CM | POA: Diagnosis not present

## 2017-10-11 MED ORDER — AMLODIPINE BESYLATE 10 MG PO TABS: ORAL_TABLET | ORAL | 3 refills | Status: DC

## 2017-10-11 MED ORDER — POTASSIUM CHLORIDE CRYS ER 20 MEQ PO TBCR: 20.0000 meq | EXTENDED_RELEASE_TABLET | Freq: Every day | ORAL | 0 refills | Status: DC

## 2017-10-11 MED ORDER — BUPROPION HCL ER (SR) 100 MG PO TB12
100.0000 mg | ORAL_TABLET | Freq: Two times a day (BID) | ORAL | 1 refills | Status: DC
Start: 2017-10-11 — End: 2017-11-25

## 2017-10-11 NOTE — Assessment & Plan Note (Signed)
Recent fasting trigs of 500. Very concerning, especially given history of pancreatitis. Discussed her diet in great detail, also recommended Fish Oil 1000 mg TID.  Repeat lipids in 6 weeks.  Consider statin therapy vs fenofibrate.

## 2017-10-11 NOTE — Assessment & Plan Note (Signed)
Borderline but has been out of her Amlodipine for weeks and is taking her mother's sparingly. Refill for Amlodipine provided. BMP with hypokalemia which was addressed.

## 2017-10-11 NOTE — Patient Instructions (Addendum)
Start bupropion SR 100 mg tablets for anxiety and depression. Take 1 tablet by mouth twice daily. Start by taking 1 tablet once daily for 5 days, then increase to twice daily thereafter.  Start Fish Oil 1000 mg three times daily with meals for triglycerides.  You will be contacted regarding your referral to nephrology.  Please let us know if you have not been contacted within one week.   Start taking oral potassium 20 mEq daily.   Schedule a follow up visit in 6 weeks for re-evaluation of anxiety and triglycerides. Make sure to come fasting.  Schedule a lab only appointment next week to recheck your potassium.   It was a pleasure to see you today!  Food Choices to Lower Your Triglycerides Triglycerides are a type of fat in your blood. High levels of triglycerides can increase the risk of heart disease and stroke. If your triglyceride levels are high, the foods you eat and your eating habits are very important. Choosing the right foods can help lower your triglycerides. What general guidelines do I need to follow?  Lose weight if you are overweight.  Limit or avoid alcohol.  Fill one half of your plate with vegetables and green salads.  Limit fruit to two servings a day. Choose fruit instead of juice.  Make one fourth of your plate whole grains. Look for the word "whole" as the first word in the ingredient list.  Fill one fourth of your plate with lean protein foods.  Enjoy fatty fish (such as salmon, mackerel, sardines, and tuna) three times a week.  Choose healthy fats.  Limit foods high in starch and sugar.  Eat more home-cooked food and less restaurant, buffet, and fast food.  Limit fried foods.  Cook foods using methods other than frying.  Limit saturated fats.  Check ingredient lists to avoid foods with partially hydrogenated oils (trans fats) in them. What foods can I eat? Grains Whole grains, such as whole wheat or whole grain breads, crackers, cereals, and  pasta. Unsweetened oatmeal, bulgur, barley, quinoa, or brown rice. Corn or whole wheat flour tortillas. Vegetables Fresh or frozen vegetables (raw, steamed, roasted, or grilled). Green salads. Fruits All fresh, canned (in natural juice), or frozen fruits. Meat and Other Protein Products Ground beef (85% or leaner), grass-fed beef, or beef trimmed of fat. Skinless chicken or [place]. Ground chicken or [place]. Pork trimmed of fat. All fish and seafood. Eggs. Dried beans, peas, or lentils. Unsalted nuts or seeds. Unsalted canned or dry beans. Dairy Low-fat dairy products, such as skim or 1% milk, 2% or reduced-fat cheeses, low-fat ricotta or cottage cheese, or plain low-fat yogurt. Fats and Oils Tub margarines without trans fats. Light or reduced-fat mayonnaise and salad dressings. Avocado. Safflower, olive, or canola oils. Natural peanut or almond butter. The items listed above may not be a complete list of recommended foods or beverages. Contact your dietitian for more options. What foods are not recommended? Grains White bread. White pasta. White rice. Cornbread. Bagels, pastries, and croissants. Crackers that contain trans fat. Vegetables White potatoes. Corn. Creamed or fried vegetables. Vegetables in a cheese sauce. Fruits Dried fruits. Canned fruit in light or heavy syrup. Fruit juice. Meat and Other Protein Products Fatty cuts of meat. Ribs, chicken wings, bacon, sausage, bologna, salami, chitterlings, fatback, hot dogs, bratwurst, and packaged luncheon meats. Dairy Whole or 2% milk, cream, half-and-half, and cream cheese. Whole-fat or sweetened yogurt. Full-fat cheeses. Nondairy creamers and whipped toppings. Processed cheese, cheese spreads, or cheese curds.  Sweets and Desserts Corn syrup, sugars, honey, and molasses. Candy. Jam and jelly. Syrup. Sweetened cereals. Cookies, pies, cakes, donuts, muffins, and ice cream. Fats and Oils Butter, stick margarine, lard, shortening, ghee, or  bacon fat. Coconut, palm kernel, or palm oils. Beverages Alcohol. Sweetened drinks (such as sodas, lemonade, and fruit drinks or punches). The items listed above may not be a complete list of foods and beverages to avoid. Contact your dietitian for more information. This information is not intended to replace advice given to you by your health care provider. Make sure you discuss any questions you have with your health care provider. Document Released: 01/05/2004 Document Revised: 08/25/2015 Document Reviewed: 01/21/2013 Elsevier Interactive Patient Education  2017 [place] 18-39 Years, Female Preventive care refers to lifestyle choices and visits with your health care provider that can promote health and wellness. What does preventive care include?  A yearly physical exam. This is also called an annual well check.  Dental exams once or twice a year.  Routine eye exams. Ask your health care provider how often you should have your eyes checked.  Personal lifestyle choices, including: ? Daily care of your teeth and gums. ? Regular physical activity. ? Eating a healthy diet. ? Avoiding tobacco and drug use. ? Limiting alcohol use. ? Practicing safe sex. ? Taking vitamin and mineral supplements as recommended by your health care provider. What happens during an annual well check? The services and screenings done by your health care provider during your annual well check will depend on your age, overall health, lifestyle risk factors, and family history of disease. Counseling Your health care provider may ask you questions about your:  Alcohol use.  Tobacco use.  Drug use.  Emotional well-being.  Home and relationship well-being.  Sexual activity.  Eating habits.  Work and work Statistician.  Method of birth control.  Menstrual cycle.  Pregnancy history.  Screening You may have the following tests or measurements:  Height, weight, and  BMI.  Diabetes screening. This is done by checking your blood sugar (glucose) after you have not eaten for a while (fasting).  Blood pressure.  Lipid and cholesterol levels. These may be checked every 5 years starting at age 74.  Skin check.  Hepatitis C blood test.  Hepatitis B blood test.  Sexually transmitted disease (STD) testing.  BRCA-related cancer screening. This may be done if you have a family history of breast, ovarian, tubal, or peritoneal cancers.  Pelvic exam and Pap test. This may be done every 3 years starting at age 14. Starting at age 32, this may be done every 5 years if you have a Pap test in combination with an HPV test.  Discuss your test results, treatment options, and if necessary, the need for more tests with your health care provider. Vaccines Your health care provider may recommend certain vaccines, such as:  Influenza vaccine. This is recommended every year.  Tetanus, diphtheria, and acellular pertussis (Tdap, Td) vaccine. You may need a Td booster every 10 years.  Varicella vaccine. You may need this if you have not been vaccinated.  HPV vaccine. If you are 30 or younger, you may need three doses over 6 months.  Measles, mumps, and rubella (MMR) vaccine. You may need at least one dose of MMR. You may also need a second dose.  Pneumococcal 13-valent conjugate (PCV13) vaccine. You may need this if you have certain conditions and were not previously vaccinated.  Pneumococcal polysaccharide (PPSV23)  vaccine. You may need one or two doses if you smoke cigarettes or if you have certain conditions.  Meningococcal vaccine. One dose is recommended if you are age 31-21 years and a first-year college student living in a residence hall, or if you have one of several medical conditions. You may also need additional booster doses.  Hepatitis A vaccine. You may need this if you have certain conditions or if you travel or work in places where you may be exposed to  hepatitis A.  Hepatitis B vaccine. You may need this if you have certain conditions or if you travel or work in places where you may be exposed to hepatitis B.  Haemophilus influenzae type b (Hib) vaccine. You may need this if you have certain risk factors.  Talk to your health care provider about which screenings and vaccines you need and how often you need them. This information is not intended to replace advice given to you by your health care provider. Make sure you discuss any questions you have with your health care provider. Document Released: 05/15/2001 Document Revised: 12/07/2015 Document Reviewed: 01/18/2015 Elsevier Interactive Patient Education  [employer].

## 2017-10-11 NOTE — Assessment & Plan Note (Signed)
Failed treatment on Zoloft and Lexapro. Given continued symptoms, will try low dose Wellbutrin SR 100 mg BID. Follow up in 6 weeks for re-evaluation.

## 2017-10-11 NOTE — Assessment & Plan Note (Signed)
Ongoing, recent potassium level of 3.2. Will resume daily potassium chloride at 20 mEq. She agrees to seeing nephrology for further evaluation and workup. This seems necessary given chronically low levels with hypertension, referral placed.

## 2017-10-11 NOTE — Progress Notes (Signed)
Subjective:    Patient ID: Melissa Chapman, female    DOB: [birth year], 32 y.o.   MRN: [id number]  HPI  Melissa Chapman is a 32 year old female who presents today for complete physical.  She ran out of her Amlodipine 10 mg 2 weeks ago, has been taking her mothers presription. She is not taking any potassium supplements OTC or Rx.   She stopped taking Lexapro around December 2018 as she didn't feel like it helped. She continues to feel fatigued, her mind races, feels very anxious. She was once on Zoloft 50 mg in the past which didn't help. She denies SI/HI.  Immunizations: -Tetanus: Unsure, believes it's been about 10 years. -Influenza: Completed last season   Diet: She endorses a fair diet. Breakfast: Crackers, cheese, meat Lunch: Chips, meat, cheese Dinner: Chicken, fish, steak, vegetable, starch Snacks: None Desserts: Daily  Beverages: Water, sweet tea, fruit punch, lemonade   Exercise: She is not exercising Eye exam: Completed in November 2018 Dental exam: Completes semi-annually  Pap Smear: Follows with GYN  BP Readings from Last 3 Encounters:  10/11/17 134/74  11/27/16 126/76  11/23/16 121/85     Review of Systems  Constitutional: Negative for unexpected weight change.  HENT: Negative for rhinorrhea.   Respiratory: Negative for cough and shortness of breath.   Cardiovascular: Negative for chest pain.  Gastrointestinal: Negative for constipation and diarrhea.  Genitourinary: Negative for difficulty urinating and menstrual problem.  Musculoskeletal: Negative for arthralgias and myalgias.  Skin: Negative for rash.  Allergic/Immunologic: Negative for environmental allergies.  Neurological: Negative for dizziness, numbness and headaches.  Psychiatric/Behavioral:       See HPI       Past Medical History:  Diagnosis Date  . Anxiety and depression   . Essential hypertension   . Pancreatitis      Social History   Socioeconomic History  . Marital status: Single     Spouse name: Not on file  . Number of children: Not on file  . Years of education: Not on file  . Highest education level: Not on file  Occupational History  . Not on file  Social Needs  . Financial resource strain: Not on file  . Food insecurity:    Worry: Not on file    Inability: Not on file  . Transportation needs:    Medical: Not on file    Non-medical: Not on file  Tobacco Use  . Smoking status: Never Smoker  . Smokeless tobacco: Never Used  Substance and Sexual Activity  . Alcohol use: Yes  . Drug use: No  . Sexual activity: Yes  Lifestyle  . Physical activity:    Days per week: Not on file    Minutes per session: Not on file  . Stress: Not on file  Relationships  . Social connections:    Talks on phone: Not on file    Gets together: Not on file    Attends religious service: Not on file    Active member of club or organization: Not on file    Attends meetings of clubs or organizations: Not on file    Relationship status: Not on file  . Intimate partner violence:    Fear of current or ex partner: Not on file    Emotionally abused: Not on file    Physically abused: Not on file    Forced sexual activity: Not on file  Other Topics Concern  . Not on file  Social History Narrative  Single.    1 son.   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    No past surgical history on file.  Family History  Problem Relation Age of Onset  . Depression Mother   . Hypertension Mother   . Hyperlipidemia Mother   . Hypertension Father   . Diabetes Father   . Hyperlipidemia Father   . Depression Sister   . Cancer Paternal Grandmother        Sinus     No Known Allergies  Current Outpatient Medications on File Prior to Visit  Medication Sig Dispense Refill  . APPLE CIDER VINEGAR PO Take by mouth.    . fluticasone (FLONASE) 50 MCG/ACT nasal spray Place 2 sprays into both nostrils daily.     No current facility-administered medications on  file prior to visit.     BP 134/74   Pulse (!) 102   Temp 98.1 F (36.7 C) (Oral)   Ht 5' 6.75" (1.695 m)   Wt 147 lb 8 oz (66.9 kg)   SpO2 98%   BMI 23.28 kg/m    Objective:   Physical Exam  Constitutional: She is oriented to person, place, and time. She appears well-nourished.  HENT:  Mouth/Throat: No oropharyngeal exudate.  Eyes: Pupils are equal, round, and reactive to light. EOM are normal.  Neck: Neck supple. No thyromegaly present.  Cardiovascular: Regular rhythm.  Respiratory: Effort normal and breath sounds normal.  GI: Soft. Bowel sounds are normal. There is no tenderness.  Musculoskeletal: Normal range of motion.  Neurological: She is alert and oriented to person, place, and time.  Skin: Skin is warm and dry.  Psychiatric: She has a normal mood and affect.           Assessment & Plan:

## 2017-10-11 NOTE — Assessment & Plan Note (Signed)
Td due, provided today. Pap smear UTD per patient. Recommended to work on diet, especially given triglyceride levels. Also recommended regular exercise. Exam unremarkable. Labs reviewed with patient. Follow up in 1 year for CPE.

## 2017-10-17 ENCOUNTER — Telehealth: Payer: Self-pay

## 2017-10-17 NOTE — Telephone Encounter (Signed)
Left message for patient to call Anastasiya back in regards to a referral-Anastasiya V Hopkins, RMA   

## 2017-10-18 ENCOUNTER — Other Ambulatory Visit (INDEPENDENT_AMBULATORY_CARE_PROVIDER_SITE_OTHER): Payer: 59

## 2017-10-18 DIAGNOSIS — E876 Hypokalemia: Secondary | ICD-10-CM | POA: Diagnosis not present

## 2017-10-18 LAB — POTASSIUM: Potassium: 3 mEq/L — ABNORMAL LOW (ref 3.5–5.1)

## 2017-10-22 ENCOUNTER — Other Ambulatory Visit: Payer: Self-pay | Admitting: Primary Care

## 2017-10-22 DIAGNOSIS — E876 Hypokalemia: Secondary | ICD-10-CM

## 2017-10-22 MED ORDER — POTASSIUM CHLORIDE CRYS ER 20 MEQ PO TBCR: 20.0000 meq | EXTENDED_RELEASE_TABLET | Freq: Two times a day (BID) | ORAL | 0 refills | Status: DC

## 2017-11-05 ENCOUNTER — Encounter: Payer: 59 | Admitting: Primary Care

## 2017-11-18 ENCOUNTER — Other Ambulatory Visit: Payer: Self-pay | Admitting: Primary Care

## 2017-11-18 DIAGNOSIS — E876 Hypokalemia: Secondary | ICD-10-CM

## 2017-11-22 ENCOUNTER — Encounter (HOSPITAL_COMMUNITY): Payer: Self-pay

## 2017-11-22 ENCOUNTER — Ambulatory Visit (HOSPITAL_COMMUNITY)
Admission: EM | Admit: 2017-11-22 | Discharge: 2017-11-22 | Disposition: A | Discharge status: Home / Self Care | Payer: 59 | Attending: Family Medicine | Admitting: Family Medicine

## 2017-11-22 DIAGNOSIS — H18821 Corneal disorder due to contact lens, right eye: Secondary | ICD-10-CM

## 2017-11-22 DIAGNOSIS — S0501XA Injury of conjunctiva and corneal abrasion without foreign body, right eye, initial encounter: Secondary | ICD-10-CM | POA: Diagnosis not present

## 2017-11-22 MED ORDER — ERYTHROMYCIN 5 MG/GM OP OINT: 1.0000 "application " | TOPICAL_OINTMENT | Freq: Four times a day (QID) | OPHTHALMIC | 0 refills | Status: DC

## 2017-11-22 MED ORDER — TETRACAINE HCL 0.5 % OP SOLN
OPHTHALMIC | Status: AC
  Filled 2017-11-22: qty 4

## 2017-11-22 NOTE — ED Triage Notes (Signed)
Pt presents with right eye pain x 1 day. Sensitivity to light. Denies any visual loss. Pain with opening eye.

## 2017-11-22 NOTE — ED Provider Notes (Signed)
[facility]    CSN: [id number] Arrival date & time: 11/22/17  0846     History   Chief Complaint Chief Complaint  Patient presents with  . Eye Pain    HPI Melissa Chapman is a 32 y.o. female.   HPI Patient states that yesterday she noticed that she had some irritation in her right eye.  She wears contact lenses.  She took that last night.  This morning when she woke up her eye was very irritated and painful.  It is very light sensitive.  It is red.  There is some yellow crusting.  She does not have her contact lens in in today.  No cold symptoms, runny stuffy nose, or fever.  Her vision is preserved. Past Medical History:  Diagnosis Date  . Anxiety and depression   . Chronic hypokalemia   . Essential hypertension   . Pancreatitis     Patient Active Problem List   Diagnosis Date Noted  . Chronic hypokalemia 10/11/2017  . Preventative health care 10/11/2017  . Hypertriglyceridemia 10/11/2017  . Elevated bilirubin 11/21/2016  . Migraine without status migrainosus, not intractable 11/15/2016  . Essential hypertension 10/22/2016  . Anxiety and depression 10/22/2016    History reviewed. No pertinent surgical history.  OB History   None      Home Medications    Prior to Admission medications   Medication Sig Start Date End Date Taking? Authorizing Provider  amLODipine (NORVASC) 10 MG tablet Take 1 tablet by mouth once daily for blood pressure. 10/11/17  Yes Doreene Nestlark, Katherine K, NP  buPROPion (WELLBUTRIN SR) 100 MG 12 hr tablet Take 1 tablet (100 mg total) by mouth 2 (two) times daily. 10/11/17  Yes Doreene Nestlark, Katherine K, NP  fluticasone (FLONASE) 50 MCG/ACT nasal spray Place 2 sprays into both nostrils daily.   Yes [provider]  APPLE CIDER VINEGAR PO Take by mouth.    [provider]  erythromycin ophthalmic ointment Place 1 application into the left eye 4 (four) times daily. Place a 1/4 inch ribbon of ointment into the lower eyelid. 11/22/17    Eustace MooreNelson, Cammi Consalvo Sue, MD  potassium chloride SA (K-DUR,KLOR-CON) 20 MEQ tablet Take 1 tablet (20 mEq total) by mouth 2 (two) times daily. 10/22/17   Doreene Nestlark, Katherine K, NP    Family History Family History  Problem Relation Age of Onset  . Depression Mother   . Hypertension Mother   . Hyperlipidemia Mother   . Hypertension Father   . Diabetes Father   . Hyperlipidemia Father   . Depression Sister   . Cancer Paternal Grandmother        Sinus     Social History Social History   Tobacco Use  . Smoking status: Never Smoker  . Smokeless tobacco: Never Used  Substance Use Topics  . Alcohol use: Yes  . Drug use: No     Allergies   Patient has no known allergies.   Review of Systems Review of Systems  Constitutional: Negative for chills and fever.  HENT: Negative for ear pain and sore throat.   Eyes: Positive for photophobia, pain, discharge and redness. Negative for visual disturbance.  Respiratory: Negative for cough and shortness of breath.   Cardiovascular: Negative for chest pain and palpitations.  Gastrointestinal: Negative for abdominal pain and vomiting.  Genitourinary: Negative for dysuria and hematuria.  Musculoskeletal: Negative for arthralgias and back pain.  Skin: Negative for color change and rash.  Neurological: Negative for seizures and syncope.  All  other systems reviewed and are negative.    Physical Exam Triage Vital Signs ED Triage Vitals  Enc Vitals Group     BP 11/22/17 0908 (!) 144/100     Pulse Rate 11/22/17 0908 100     Resp 11/22/17 0908 16     Temp 11/22/17 0908 98.5 F (36.9 C)     Temp Source 11/22/17 0908 Oral     SpO2 11/22/17 0908 100 %     Weight --      Height --      Head Circumference --      Peak Flow --      Pain Score 11/22/17 0914 6     Pain Loc --      Pain Edu? --      Excl. in GC? --    No data found.  Updated Vital Signs BP (!) 144/100 (BP Location: Left Arm)   Pulse 100   Temp 98.5 F (36.9 C) (Oral)    Resp 16   SpO2 100%   Visual Acuity Right Eye Distance: 20/50 Left Eye Distance: 20/30 Bilateral Distance: 20/25  Right Eye Near:   Left Eye Near:    Bilateral Near:     Physical Exam  Constitutional: She appears well-developed and well-nourished. No distress.  HENT:  Head: Normocephalic and atraumatic.  Mouth/Throat: Oropharynx is clear and moist.  Eyes: Pupils are equal, round, and reactive to light. EOM and lids are normal. Lids are everted and swept, no foreign bodies found. Right conjunctiva is injected.  Slit lamp exam:      The right eye shows corneal abrasion and fluorescein uptake.    Neck: Normal range of motion.  Cardiovascular: Normal rate.  Pulmonary/Chest: Effort normal. No respiratory distress.  Abdominal: Soft. She exhibits no distension.  Musculoskeletal: Normal range of motion. She exhibits no edema.  Neurological: She is alert.  Skin: Skin is warm and dry.     UC Treatments / Results  Labs (all labs ordered are listed, but only abnormal results are displayed) Labs Reviewed - No data to display  EKG None  Radiology No results found.  Procedures Procedures (including critical care time)  Medications Ordered in UC Medications - No data to display  Initial Impression / Assessment and Plan / UC Course  I have reviewed the triage vital signs and the nursing notes.  Pertinent labs & imaging results that were available during my care of the patient were reviewed by me and considered in my medical decision making (see chart for details).     Discussed outpatient treatment of corneal abrasion.  Needs to see eye specialty if symptoms persist.  To ER if worse at any time instead of better Final Clinical Impressions(s) / UC Diagnoses   Final diagnoses:  Abrasion of right cornea, initial encounter  Corneal abrasion of right eye due to contact lens     Discharge Instructions     Use eye testing 4 times a day.  May use more often if needed.  Be  sure to put a good dose and at bedtime. Ibuprofen as needed for pain Return if worse at any time instead of better.  Expect improvement over 24 to 48 hours. Do not use contact lens until eye is pain-free   ED Prescriptions    Medication Sig Dispense Auth. Provider   erythromycin ophthalmic ointment Place 1 application into the left eye 4 (four) times daily. Place a 1/4 inch ribbon of ointment into the lower eyelid. 1 g  Eustace Moore, MD     Controlled Substance Prescriptions [place] Controlled Substance Registry consulted? Not Applicable   Eustace Moore, MD 11/22/17 2057

## 2017-11-22 NOTE — Discharge Instructions (Signed)
Use eye testing 4 times a day.  May use more often if needed.  Be sure to put a good dose and at bedtime. Ibuprofen as needed for pain Return if worse at any time instead of better.  Expect improvement over 24 to 48 hours. Do not use contact lens until eye is pain-free

## 2017-11-25 ENCOUNTER — Encounter: Payer: Self-pay | Admitting: Primary Care

## 2017-11-25 ENCOUNTER — Ambulatory Visit (INDEPENDENT_AMBULATORY_CARE_PROVIDER_SITE_OTHER): Payer: 59 | Admitting: Primary Care

## 2017-11-25 VITALS — BP 120/78 | HR 110 | Temp 97.8°F | Ht 66.75 in | Wt 146.5 lb

## 2017-11-25 DIAGNOSIS — F329 Major depressive disorder, single episode, unspecified: Secondary | ICD-10-CM

## 2017-11-25 DIAGNOSIS — E781 Pure hyperglyceridemia: Secondary | ICD-10-CM | POA: Diagnosis not present

## 2017-11-25 DIAGNOSIS — I10 Essential (primary) hypertension: Secondary | ICD-10-CM

## 2017-11-25 DIAGNOSIS — F419 Anxiety disorder, unspecified: Secondary | ICD-10-CM

## 2017-11-25 DIAGNOSIS — E876 Hypokalemia: Secondary | ICD-10-CM | POA: Diagnosis not present

## 2017-11-25 LAB — BASIC METABOLIC PANEL
BUN: 9 mg/dL (ref 6–23)
CALCIUM: 9.4 mg/dL (ref 8.4–10.5)
CO2: 28 mEq/L (ref 19–32)
CREATININE: 0.84 mg/dL (ref 0.40–1.20)
Chloride: 102 mEq/L (ref 96–112)
GFR: 100.66 mL/min (ref >60.00)
Glucose, Bld: 111 mg/dL — ABNORMAL HIGH (ref 70–99)
Potassium: 3.1 mEq/L — ABNORMAL LOW (ref 3.5–5.1)
Sodium: 139 mEq/L (ref 135–145)

## 2017-11-25 LAB — TSH: TSH: 1.54 u[IU]/mL (ref 0.35–4.50)

## 2017-11-25 MED ORDER — BUPROPION HCL ER (SR) 150 MG PO TB12: 150.0000 mg | ORAL_TABLET | Freq: Two times a day (BID) | ORAL | 0 refills | Status: DC

## 2017-11-25 NOTE — Assessment & Plan Note (Signed)
Poor diet, is not exercising.  Not fasting today. Will have her return for fasting lipid panel. Discussed to work on diet, handout provided.

## 2017-11-25 NOTE — Progress Notes (Signed)
Subjective:    Patient ID: Melissa Chapman, female    DOB: [birth year], 32 y.o.   MRN: [id number]  HPI  Melissa Chapman is a 32 year old female who presents today for follow up.  1) Anxiety and Depression: Currently managed on Wellbutrin SR 100 mg BID which was initiated in mid July 2019 due to continued reports of symptoms. She had failed treatment on Lexapro and Zoloft in the past.    Since her last visit she's feeling better. She's feeling less aggravated, less argumentative, less worry. She does continue to feel fidgety and anxious, believes she may benefit from an increased dose of Wellbutrin. She denies SI/HI.   2) Hypokalemia: Recurrent hypokalemia. She is taking potassium chloride 4 times weekly on average as she often forgets. She was referred to Nephrology last visit and has an appointment on September 16th at 9:40 am.   BP Readings from Last 3 Encounters:  11/25/17 120/78  11/22/17 (!) 144/100  10/11/17 134/74     3) Hypertriglyceridemia: Lipid panel in June 2019 with Trigs of 510. History of acute pancreatitis in August 2018. She is taking Fish Oil 1000 mg infrequently.   Diet currently consists of:  Breakfast: Sausage burrito, eggs, bacon, bread, cheese and nuts Lunch: Skips sometimes, tacos, sometimes salad Dinner: Vegetable, meat, starch Snacks: Sometimes crackers Desserts: Occasionally, recently increased Beverages: Water, juice, sweet tea sometimes, weekend alcohol   Exercise: She is not exercising   Review of Systems  Constitutional: Negative for fatigue.  Eyes: Negative for visual disturbance.  Respiratory: Negative for shortness of breath.   Cardiovascular: Negative for chest pain and palpitations.  Neurological: Negative for dizziness.  Psychiatric/Behavioral:       See HPI       Past Medical History:  Diagnosis Date  . Anxiety and depression   . Chronic hypokalemia   . Essential hypertension   . Pancreatitis      Social History    Socioeconomic History  . Marital status: Single    Spouse name: Not on file  . Number of children: Not on file  . Years of education: Not on file  . Highest education level: Not on file  Occupational History  . Not on file  Social Needs  . Financial resource strain: Not on file  . Food insecurity:    Worry: Not on file    Inability: Not on file  . Transportation needs:    Medical: Not on file    Non-medical: Not on file  Tobacco Use  . Smoking status: Never Smoker  . Smokeless tobacco: Never Used  Substance and Sexual Activity  . Alcohol use: Yes  . Drug use: No  . Sexual activity: Yes  Lifestyle  . Physical activity:    Days per week: Not on file    Minutes per session: Not on file  . Stress: Not on file  Relationships  . Social connections:    Talks on phone: Not on file    Gets together: Not on file    Attends religious service: Not on file    Active member of club or organization: Not on file    Attends meetings of clubs or organizations: Not on file    Relationship status: Not on file  . Intimate partner violence:    Fear of current or ex partner: Not on file    Emotionally abused: Not on file    Physically abused: Not on file    Forced sexual activity: Not on file  Other Topics Concern  . Not on file  Social History Narrative   Single.    1 son.   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    No past surgical history on file.  Family History  Problem Relation Age of Onset  . Depression Mother   . Hypertension Mother   . Hyperlipidemia Mother   . Hypertension Father   . Diabetes Father   . Hyperlipidemia Father   . Depression Sister   . Cancer Paternal Grandmother        Sinus     No Known Allergies  Current Outpatient Medications on File Prior to Visit  Medication Sig Dispense Refill  . amLODipine (NORVASC) 10 MG tablet Take 1 tablet by mouth once daily for blood pressure. 90 tablet 3  . APPLE CIDER VINEGAR PO  Take by mouth.    Marland Kitchen. buPROPion (WELLBUTRIN SR) 100 MG 12 hr tablet Take 1 tablet (100 mg total) by mouth 2 (two) times daily. 60 tablet 1  . erythromycin ophthalmic ointment Place 1 application into the left eye 4 (four) times daily. Place a 1/4 inch ribbon of ointment into the lower eyelid. 1 g 0  . fluticasone (FLONASE) 50 MCG/ACT nasal spray Place 2 sprays into both nostrils daily.    . potassium chloride SA (K-DUR,KLOR-CON) 20 MEQ tablet Take 1 tablet (20 mEq total) by mouth 2 (two) times daily. 180 tablet 0   No current facility-administered medications on file prior to visit.     BP 120/78   Pulse (!) 110   Temp 97.8 F (36.6 C) (Oral)   Ht 5' 6.75" (1.695 m)   Wt 146 lb 8 oz (66.5 kg)   SpO2 97%   BMI 23.12 kg/m    Objective:   Physical Exam  Constitutional: She appears well-nourished.  Neck: Neck supple.  Cardiovascular: Normal rate and regular rhythm.  Respiratory: Effort normal and breath sounds normal.  Skin: Skin is warm and dry.  Psychiatric: She has a normal mood and affect.           Assessment & Plan:

## 2017-11-25 NOTE — Assessment & Plan Note (Signed)
Stable in the office today. Continue Amlodipine. Tachycardia noted, patient feels anxious during office visits. Will have her start monitoring home HR and update if consistently above 100. Check TSH and BMP.

## 2017-11-25 NOTE — Assessment & Plan Note (Signed)
Non compliant to daily potassium use. Check potassium levels today. Due to see nephrology in mid September 2019.

## 2017-11-25 NOTE — Patient Instructions (Addendum)
Stop by the lab prior to leaving today. I will notify you of your results once received.   Work on [employer] Holdings[employer] diet. Take a look at the information below.  Take the potassium pills daily as discussed.   Resume Fish Oil 1000 mg once daily with a meal.  Follow up with the nephrologist as scheduled.   Start monitoring your heart rate at home as discussed, notify me if it runs at or above 100 consistently.   We've increased the dose of your bupropion SR to 150 mg twice daily. Please update me in 4 weeks.   Schedule a lab only appointment to return for triglyceride check.  It was a pleasure to see you today!  Food Choices to Lower Your Triglycerides Triglycerides are a type of fat in your blood. High levels of triglycerides can increase the risk of heart disease and stroke. If your triglyceride levels are high, the foods you eat and your eating habits are very important. Choosing the right foods can help lower your triglycerides. What general guidelines do I need to follow?  Lose weight if you are overweight.  Limit or avoid alcohol.  Fill one half of your plate with vegetables and green salads.  Limit fruit to two servings a day. Choose fruit instead of juice.  Make one fourth of your plate whole grains. Look for the word "whole" as the first word in the ingredient list.  Fill one fourth of your plate with lean protein foods.  Enjoy fatty fish (such as salmon, mackerel, sardines, and tuna) three times a week.  Choose healthy fats.  Limit foods high in starch and sugar.  Eat more home-cooked food and less restaurant, buffet, and fast food.  Limit fried foods.  Cook foods using methods other than frying.  Limit saturated fats.  Check ingredient lists to avoid foods with partially hydrogenated oils (trans fats) in them. What foods can I eat? Grains Whole grains, such as whole wheat or whole grain breads, crackers, cereals, and pasta. Unsweetened oatmeal, bulgur, barley, quinoa,  or brown rice. Corn or whole wheat flour tortillas. Vegetables Fresh or frozen vegetables (raw, steamed, roasted, or grilled). Green salads. Fruits All fresh, canned (in natural juice), or frozen fruits. Meat and Other Protein Products Ground beef (85% or leaner), grass-fed beef, or beef trimmed of fat. Skinless chicken or [place]. Ground chicken or [place]. Pork trimmed of fat. All fish and seafood. Eggs. Dried beans, peas, or lentils. Unsalted nuts or seeds. Unsalted canned or dry beans. Dairy Low-fat dairy products, such as skim or 1% milk, 2% or reduced-fat cheeses, low-fat ricotta or cottage cheese, or plain low-fat yogurt. Fats and Oils Tub margarines without trans fats. Light or reduced-fat mayonnaise and salad dressings. Avocado. Safflower, olive, or canola oils. Natural peanut or almond butter. The items listed above may not be a complete list of recommended foods or beverages. Contact your dietitian for more options. What foods are not recommended? Grains White bread. White pasta. White rice. Cornbread. Bagels, pastries, and croissants. Crackers that contain trans fat. Vegetables White potatoes. Corn. Creamed or fried vegetables. Vegetables in a cheese sauce. Fruits Dried fruits. Canned fruit in light or heavy syrup. Fruit juice. Meat and Other Protein Products Fatty cuts of meat. Ribs, chicken wings, bacon, sausage, bologna, salami, chitterlings, fatback, hot dogs, bratwurst, and packaged luncheon meats. Dairy Whole or 2% milk, cream, half-and-half, and cream cheese. Whole-fat or sweetened yogurt. Full-fat cheeses. Nondairy creamers and whipped toppings. Processed cheese, cheese spreads, or cheese curds. Sweets  and Desserts Corn syrup, sugars, honey, and molasses. Candy. Jam and jelly. Syrup. Sweetened cereals. Cookies, pies, cakes, donuts, muffins, and ice cream. Fats and Oils Butter, stick margarine, lard, shortening, ghee, or bacon fat. Coconut, palm kernel, or palm  oils. Beverages Alcohol. Sweetened drinks (such as sodas, lemonade, and fruit drinks or punches). The items listed above may not be a complete list of foods and beverages to avoid. Contact your dietitian for more information. This information is not intended to replace advice given to you by your health care provider. Make sure you discuss any questions you have with your health care provider. Document Released: 01/05/2004 Document Revised: 08/25/2015 Document Reviewed: 01/21/2013 Elsevier Interactive Patient Education  2017 [employer].

## 2017-11-25 NOTE — Assessment & Plan Note (Addendum)
Overall improved on Wellbutrin SR 100 mg BID, will increase dose slightly to 150 mg BID for continued improvement in symptoms. She will update in 4 weeks. Denies SI/HI.

## 2017-11-28 ENCOUNTER — Encounter: Payer: Self-pay | Admitting: *Deleted

## 2017-11-29 ENCOUNTER — Other Ambulatory Visit: Payer: Self-pay | Admitting: Primary Care

## 2017-11-29 ENCOUNTER — Other Ambulatory Visit: Payer: 59

## 2017-11-29 DIAGNOSIS — E876 Hypokalemia: Secondary | ICD-10-CM

## 2017-12-13 ENCOUNTER — Other Ambulatory Visit: Payer: Self-pay | Admitting: Primary Care

## 2017-12-13 DIAGNOSIS — F329 Major depressive disorder, single episode, unspecified: Secondary | ICD-10-CM

## 2017-12-13 DIAGNOSIS — F419 Anxiety disorder, unspecified: Principal | ICD-10-CM

## 2017-12-17 ENCOUNTER — Telehealth: Payer: Self-pay

## 2017-12-17 NOTE — Telephone Encounter (Signed)
Called patient to follow up on missed appointment with nephrologist but voicemail was full and patient is not at work number today. Will try again later-Melissa Chapman, RMA

## 2017-12-23 NOTE — Telephone Encounter (Signed)
Left message for patient to call me back-Rohil Lesch V Jakolby Sedivy, RMA  

## 2018-01-02 ENCOUNTER — Other Ambulatory Visit: Payer: Self-pay | Admitting: Primary Care

## 2018-01-02 DIAGNOSIS — F329 Major depressive disorder, single episode, unspecified: Secondary | ICD-10-CM

## 2018-01-02 DIAGNOSIS — F419 Anxiety disorder, unspecified: Principal | ICD-10-CM

## 2018-02-16 ENCOUNTER — Encounter (HOSPITAL_COMMUNITY): Payer: Self-pay | Admitting: Emergency Medicine

## 2018-02-16 ENCOUNTER — Ambulatory Visit (HOSPITAL_COMMUNITY): Payer: 59

## 2018-02-16 ENCOUNTER — Ambulatory Visit (HOSPITAL_COMMUNITY)
Admission: EM | Admit: 2018-02-16 | Discharge: 2018-02-16 | Disposition: A | Discharge status: Home / Self Care | Payer: 59 | Attending: Family Medicine | Admitting: Family Medicine

## 2018-02-16 ENCOUNTER — Other Ambulatory Visit: Payer: Self-pay

## 2018-02-16 DIAGNOSIS — J020 Streptococcal pharyngitis: Secondary | ICD-10-CM

## 2018-02-16 LAB — POCT RAPID STREP A: Streptococcus, Group A Screen (Direct): POSITIVE — AB

## 2018-02-16 MED ORDER — AMOXICILLIN 500 MG PO CAPS: 500.0000 mg | ORAL_CAPSULE | Freq: Two times a day (BID) | ORAL | 0 refills | Status: AC

## 2018-02-16 MED ORDER — ACETAMINOPHEN 325 MG PO TABS
ORAL_TABLET | ORAL | Status: AC
  Filled 2018-02-16: qty 2

## 2018-02-16 MED ORDER — ACETAMINOPHEN 325 MG PO TABS
650.0000 mg | ORAL_TABLET | Freq: Once | ORAL | Status: AC
  Administered 2018-02-16: 650 mg via ORAL

## 2018-02-16 NOTE — Discharge Instructions (Signed)
Push fluids to ensure adequate hydration and keep secretions thin.  Tylenol and/or ibuprofen as needed for pain or fevers.  Complete course of antibiotics.  Throat lozenges, gargles, chloraseptic spray, warm teas, popsicles etc to help with throat pain.   Considered contagious for 24 hours once antibiotics are started, change out toothbrush in 24 hours.   If symptoms worsen or do not improve in the next week to return to be seen or to follow up with your PCP.

## 2018-02-16 NOTE — ED Provider Notes (Signed)
[facility]    CSN: [id number] Arrival date & time: 02/16/18  1447     History   Chief Complaint Chief Complaint  Patient presents with  . Cough  . Sore Throat    HPI Melissa Chapman is a 32 y.o. female.   Melissa Chapman presents with complaints of body aches, congestion, bilateral ear pain, sore throat with swallowing, productive cough, loose stool. No vomiting or abdominal pain. No shortness of breath . Started primarily yesterday am but did have some cough leading up to symptoms. No rash. No history of asthma. Smokes 1ppd. Has not gotten a flu vaccine this season. 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 Has tried alkaselzer cold, theraflu, and other combination medication which did have tylenol, today, last at 1000. Took naproxen at 1400 today as well. Medications have minimally helped. Hx of anxiety depression, htn, pancreatitis, hypertriglyceridemia, migraines.     ROS per HPI.      Past Medical History:  Diagnosis Date  . Anxiety and depression   . Chronic hypokalemia   . Essential hypertension   . Pancreatitis     Patient Active Problem List   Diagnosis Date Noted  . Chronic hypokalemia 10/11/2017  . Preventative health care 10/11/2017  . Hypertriglyceridemia 10/11/2017  . Elevated bilirubin 11/21/2016  . Migraine without status migrainosus, not intractable 11/15/2016  . Essential hypertension 10/22/2016  . Anxiety and depression 10/22/2016    History reviewed. No pertinent surgical history.  OB History   None      Home Medications    Prior to Admission medications   Medication Sig Start Date End Date Taking? Authorizing Provider  acetaminophen (TYLENOL) 325 MG tablet Take 650 mg by mouth every 6 (six) hours as needed.   Yes [provider]  amLODipine (NORVASC) 10 MG tablet Take 1 tablet by mouth once daily for blood pressure. 10/11/17  Yes Doreene Nest, NP  buPROPion (WELLBUTRIN SR) 150 MG 12 hr tablet Take 1  tablet (150 mg total) by mouth 2 (two) times daily. 11/25/17  Yes Doreene Nest, NP  naproxen (NAPROSYN) 500 MG tablet Take 500 mg by mouth 2 (two) times daily with a meal.   Yes [provider]  amoxicillin (AMOXIL) 500 MG capsule Take 1 capsule (500 mg total) by mouth 2 (two) times daily for 10 days. 02/16/18 02/26/18  Georgetta Haber, NP    Family History Family History  Problem Relation Age of Onset  . Depression Mother   . Hypertension Mother   . Hyperlipidemia Mother   . Hypertension Father   . Diabetes Father   . Hyperlipidemia Father   . Depression Sister   . Cancer Paternal Grandmother        Sinus     Social History Social History   Tobacco Use  . Smoking status: Never Smoker  . Smokeless tobacco: Never Used  Substance Use Topics  . Alcohol use: Yes  . Drug use: No     Allergies   Patient has no known allergies.   Review of Systems Review of Systems   Physical Exam Triage Vital Signs ED Triage Vitals  Enc Vitals Group     BP 02/16/18 1514 (!) 126/96     Pulse Rate 02/16/18 1514 (!) 120     Resp 02/16/18 1514 18     Temp 02/16/18 1514 (!) 101 F (38.3 C)     Temp Source 02/16/18 1514 Oral     SpO2 02/16/18 1514 100 %  Weight --      Height --      Head Circumference --      Peak Flow --      Pain Score 02/16/18 1512 4     Pain Loc --      Pain Edu? --      Excl. in GC? --    No data found.  Updated Vital Signs BP (!) 126/96 (BP Location: Right Arm)   Pulse (!) 120   Temp (!) 101 F (38.3 C) (Oral)   Resp 18   SpO2 100%   Physical Exam  Constitutional: She is oriented to person, place, and time. She appears well-developed and well-nourished. She appears ill. No distress.  HENT:  Head: Normocephalic and atraumatic.  Right Ear: Tympanic membrane, external ear and ear canal normal.  Left Ear: Tympanic membrane, external ear and ear canal normal.  Nose: Rhinorrhea present.  Mouth/Throat: Uvula is midline and mucous  membranes are normal. Posterior oropharyngeal erythema present. Tonsillar exudate.  Small exudate bilaterally   Eyes: Pupils are equal, round, and reactive to light. Conjunctivae and EOM are normal.  Cardiovascular: Regular rhythm and normal heart sounds. Tachycardia present.  Pulmonary/Chest: Effort normal and breath sounds normal.  Abdominal: Soft. There is no tenderness.  Neurological: She is alert and oriented to person, place, and time.  Skin: Skin is warm and dry.     UC Treatments / Results  Labs (all labs ordered are listed, but only abnormal results are displayed) Labs Reviewed  POCT RAPID STREP A - Abnormal; Notable for the following components:      Result Value   Streptococcus, Group A Screen (Direct) POSITIVE (*)    All other components within normal limits    EKG None  Radiology No results found.  Procedures Procedures (including critical care time)  Medications Ordered in UC Medications  acetaminophen (TYLENOL) tablet 650 mg (650 mg Oral Given 02/16/18 1530)    Initial Impression / Assessment and Plan / UC Course  I have reviewed the triage vital signs and the nursing notes.  Pertinent labs & imaging results that were available during my care of the patient were reviewed by me and considered in my medical decision making (see chart for details).     Positive rapid strep, consistent with history and physical. Also with cough and congestion, likely additional viral URI, discussed with patient. Complete course of antibiotics. Supportive cares for symptoms. Return precautions provided. If symptoms worsen or do not improve in the next week to return to be seen or to follow up with PCP.  Patient verbalized understanding and agreeable to plan.   Final Clinical Impressions(s) / UC Diagnoses   Final diagnoses:  Strep pharyngitis     Discharge Instructions     Push fluids to ensure adequate hydration and keep secretions thin.  Tylenol and/or ibuprofen as  needed for pain or fevers.  Complete course of antibiotics.  Throat lozenges, gargles, chloraseptic spray, warm teas, popsicles etc to help with throat pain.   Considered contagious for 24 hours once antibiotics are started, change out toothbrush in 24 hours.   If symptoms worsen or do not improve in the next week to return to be seen or to follow up with your PCP.      ED Prescriptions    Medication Sig Dispense Auth. Provider   amoxicillin (AMOXIL) 500 MG capsule Take 1 capsule (500 mg total) by mouth 2 (two) times daily for 10 days. 20 capsule Linus MakoBurky, Lynden Flemmer B,  NP     Controlled Substance Prescriptions [place] Controlled Substance Registry consulted? Not Applicable   Georgetta Haber, NP 02/16/18 [phone]

## 2018-02-16 NOTE — ED Triage Notes (Signed)
[facility] a [facility] cough, congestion and body aches x 3 days.

## 2018-03-01 ENCOUNTER — Encounter (HOSPITAL_COMMUNITY): Payer: Self-pay | Admitting: Emergency Medicine

## 2018-03-01 ENCOUNTER — Emergency Department
Admission: EM | Admit: 2018-03-01 | Discharge: 2018-03-01 | Disposition: A | Discharge status: Home / Self Care | Payer: 59 | Attending: Emergency Medicine | Admitting: Emergency Medicine

## 2018-03-01 ENCOUNTER — Encounter: Payer: Self-pay | Admitting: Emergency Medicine

## 2018-03-01 ENCOUNTER — Ambulatory Visit (HOSPITAL_COMMUNITY)
Admission: EM | Admit: 2018-03-01 | Discharge: 2018-03-01 | Disposition: A | Discharge status: Home / Self Care | Payer: 59

## 2018-03-01 ENCOUNTER — Other Ambulatory Visit: Payer: Self-pay

## 2018-03-01 ENCOUNTER — Emergency Department: Payer: 59

## 2018-03-01 DIAGNOSIS — Z79899 Other long term (current) drug therapy: Secondary | ICD-10-CM | POA: Insufficient documentation

## 2018-03-01 DIAGNOSIS — M25562 Pain in left knee: Secondary | ICD-10-CM | POA: Diagnosis not present

## 2018-03-01 DIAGNOSIS — M2392 Unspecified internal derangement of left knee: Secondary | ICD-10-CM | POA: Insufficient documentation

## 2018-03-01 DIAGNOSIS — I10 Essential (primary) hypertension: Secondary | ICD-10-CM | POA: Insufficient documentation

## 2018-03-01 MED ORDER — BUPIVACAINE HCL (PF) 0.5 % IJ SOLN
30.0000 mL | Freq: Once | INTRAMUSCULAR | Status: AC
  Administered 2018-03-01: 30 mL
  Filled 2018-03-01: qty 30

## 2018-03-01 MED ORDER — KETOROLAC TROMETHAMINE 60 MG/2ML IM SOLN
INTRAMUSCULAR | Status: AC
  Filled 2018-03-01: qty 2

## 2018-03-01 MED ORDER — HYDROCODONE-ACETAMINOPHEN 5-325 MG PO TABS
1.0000 | ORAL_TABLET | Freq: Once | ORAL | Status: AC
  Administered 2018-03-01: 1 via ORAL

## 2018-03-01 MED ORDER — MORPHINE SULFATE (PF) 2 MG/ML IV SOLN
1.0000 mg | Freq: Once | INTRAVENOUS | Status: AC
  Administered 2018-03-01: 1 mg via INTRAVENOUS
  Filled 2018-03-01: qty 1

## 2018-03-01 MED ORDER — KETOROLAC TROMETHAMINE 60 MG/2ML IM SOLN
60.0000 mg | Freq: Once | INTRAMUSCULAR | Status: AC
  Administered 2018-03-01: 60 mg via INTRAMUSCULAR

## 2018-03-01 MED ORDER — TRAMADOL HCL 50 MG PO TABS: 50.0000 mg | ORAL_TABLET | Freq: Four times a day (QID) | ORAL | 0 refills | Status: DC | PRN

## 2018-03-01 MED ORDER — HYDROCODONE-ACETAMINOPHEN 5-325 MG PO TABS
ORAL_TABLET | ORAL | Status: AC
  Filled 2018-03-01: qty 1

## 2018-03-01 NOTE — ED Triage Notes (Signed)
Pt arrives POV to triage with c/o left knee pain x 1 1/2 month. Pt reports no new injury to knee but "it's just not getting better". Pt is ambulatory at this time and in NAD.

## 2018-03-01 NOTE — Discharge Instructions (Signed)
Fortunately today the x-ray of your knee is very reassuring.  I think you likely have a tear in the cartilage of your knee that would be best evaluated by an orthopedic physician.  Please make an appointment to [facility] this coming week for recheck and return to the emergency department sooner for any concerns.  When you go to the appointment you can let them know that I put 5 cc of bupivacaine as well as 1 mg of morphine in your knee.  It was a pleasure to take care of you today, and thank you for coming to our emergency department.  If you have any questions or concerns before leaving please ask the nurse to grab me and I'm more than happy to go through your aftercare instructions again.  If you have any concerns once you are home that you are not improving or are in fact getting worse before you can make it to your follow-up appointment, please do not hesitate to call 911 and come back for further evaluation.  Merrily BrittleNeil Absalom Aro, MD   Dg Knee Complete 4 Views Left  Result Date: 03/01/2018 CLINICAL DATA:  Left knee pain. Patient reports persistent pain after fall 1-2 months ago. EXAM: LEFT KNEE - COMPLETE 4+ VIEW COMPARISON:  Report from left knee radiographs 01/16/2018, images not available. FINDINGS: No evidence of fracture, dislocation, or joint effusion. No evidence of arthropathy or other focal bone abnormality. Soft tissues are unremarkable. IMPRESSION: Negative radiographs of the left knee. Electronically Signed   By: Narda RutherfordMelanie  Sanford M.D.   On: 03/01/2018 06:01

## 2018-03-01 NOTE — Discharge Instructions (Addendum)
We will give you a Toradol injection and hydrocodone here for pain Please follow-up with Melissa Chapman and Thurston HoleWainer in the morning at 10 AM at the urgent care clinic if you are still having symptoms I am also sending some tramadol to the pharmacy to use as needed for pain

## 2018-03-01 NOTE — ED Notes (Signed)
MD at bedside. 

## 2018-03-01 NOTE — ED Provider Notes (Signed)
[facility] Emergency Department Provider Note  ____________________________________________   None    (approximate)  I have reviewed the triage vital signs and the nursing notes.   HISTORY  Chief Complaint Knee Pain    HPI Melissa Chapman is a 32 y.o. female who comes to the emergency department with rather severe left knee pain for the past 4 to 6 weeks.  She initially injured her knee while stepping down and twisting it and says she has been seen by a physician assistant at the orthopedic clinic who obtained x-rays which were negative and told the patient that she likely had  a knee sprain.  She is used a knee brace as well as Percocet and nonsteroidals without much improvement.  Her pain is now nearly constant.  It is moderate to severe.  It is worse when walking.  She is particularly frustrated as she is a very active person and she says that when she walks her knee "catches" "clicks and pops" and several times she has nearly fallen.  She feels like her knee gets locked and is difficult to "unstick".  She had no fevers or chills.  The pain does seem to radiate from within her knee up her thigh and down towards her shin.  She has no history of DVT or pulmonary embolism.  No chest pain or shortness of breath.  No leg swelling.  No recent surgery travel or immobilization.   Past Medical History:  Diagnosis Date  . Anxiety and depression   . Chronic hypokalemia   . Essential hypertension   . Pancreatitis     Patient Active Problem List   Diagnosis Date Noted  . Chronic hypokalemia 10/11/2017  . Preventative health care 10/11/2017  . Hypertriglyceridemia 10/11/2017  . Elevated bilirubin 11/21/2016  . Migraine without status migrainosus, not intractable 11/15/2016  . Essential hypertension 10/22/2016  . Anxiety and depression 10/22/2016    History reviewed. No pertinent surgical history.  Prior to Admission medications   Medication Sig Start Date  End Date Taking? Authorizing Provider  acetaminophen (TYLENOL) 325 MG tablet Take 650 mg by mouth every 6 (six) hours as needed.    [provider]  amLODipine (NORVASC) 10 MG tablet Take 1 tablet by mouth once daily for blood pressure. 10/11/17   Doreene Nest, NP  buPROPion (WELLBUTRIN SR) 150 MG 12 hr tablet Take 1 tablet (150 mg total) by mouth 2 (two) times daily. 11/25/17   Doreene Nest, NP  naproxen (NAPROSYN) 500 MG tablet Take 500 mg by mouth 2 (two) times daily with a meal.    [provider]    Allergies Patient has no known allergies.  Family History  Problem Relation Age of Onset  . Depression Mother   . Hypertension Mother   . Hyperlipidemia Mother   . Hypertension Father   . Diabetes Father   . Hyperlipidemia Father   . Depression Sister   . Cancer Paternal Grandmother        Sinus     Social History Social History   Tobacco Use  . Smoking status: Never Smoker  . Smokeless tobacco: Never Used  Substance Use Topics  . Alcohol use: Yes  . Drug use: No    Review of Systems Constitutional: No fever/chills Cardiovascular: Denies chest pain. Respiratory: Denies shortness of breath. Musculoskeletal: Positive for knee pain Neurological: Negative for headaches   ____________________________________________   PHYSICAL EXAM:  VITAL SIGNS: ED Triage Vitals  Enc Vitals Group  BP 03/01/18 0419 116/85     Pulse Rate 03/01/18 0419 (!) 112     Resp 03/01/18 0419 18     Temp 03/01/18 0419 98.5 F (36.9 C)     Temp Source 03/01/18 0419 Oral     SpO2 03/01/18 0419 98 %     Weight 03/01/18 0419 145 lb (65.8 kg)     Height 03/01/18 0419 5\' 7"  (1.702 m)     Head Circumference --      Peak Flow --      Pain Score 03/01/18 0417 7     Pain Loc --      Pain Edu? --      Excl. in GC? --     Constitutional: Alert and oriented x4 tearful and very uncomfortable and frustrated appearing Cardiovascular: Tachycardic regular  rhythm Respiratory: Normal respiratory effort.  No retractions. MSK: Left knee with no effusion.  Extensor mechanism intact.  Knee grossly stable.  No focal bony tenderness.  Negative Lachman's test.  Positive McMurray's test Neurologic:  Normal speech and language. No gross focal neurologic deficits are appreciated.  Skin:  Skin is warm, dry and intact. No rash noted.    ____________________________________________  LABS (all labs ordered are listed, but only abnormal results are displayed)  Labs Reviewed - No data to display   __________________________________________  EKG   ____________________________________________  RADIOLOGY  X-ray of the left knee reviewed by me with no acute disease ____________________________________________   DIFFERENTIAL includes but not limited to  Internal derangement of the knee, meniscal tear, ACL tear, septic joint   PROCEDURES  Procedure(s) performed: Yes  .Joint Aspiration/Arthrocentesis Date/Time: 03/01/2018 6:32 AM Performed by: Merrily Brittleifenbark, Desiderio Dolata, MD Authorized by: Merrily Brittleifenbark, Anjolie Majer, MD   Consent:    Consent obtained:  Verbal   Consent given by:  Patient   Risks discussed:  Bleeding, infection and pain   Alternatives discussed:  Alternative treatment Location:    Location:  Knee   Knee:  L knee Anesthesia (see MAR for exact dosages):    Anesthesia method:  None Procedure details:    Preparation: Patient was prepped and draped in usual sterile fashion     Needle gauge: 21.   Ultrasound guidance: no     Approach:  Inferior   Steroid injected: no     Specimen collected: no   Post-procedure details:    Dressing:  Adhesive bandage Comments:     In usual sterile technique I used a 21-gauge needle to access the patient's left knee via lateral approach.  I then infused 6 cc of anesthetic.  5 cc were 0.5% bupivacaine without epinephrine and 1 cc was 1 mg of morphine    Critical Care performed:  no  ____________________________________________   INITIAL IMPRESSION / ASSESSMENT AND PLAN / ED COURSE  Pertinent labs & imaging results that were available during my care of the patient were reviewed by me and considered in my medical decision making (see chart for details).   As part of my medical decision making, I reviewed the following data within the electronic MEDICAL RECORD NUMBER History obtained from family if available, nursing notes, old chart and ekg, as well as notes from prior ED visits.  The patient has 4 to 6 weeks of persistent left knee pain.  Her history of clicking popping and locking is consistent with meniscal tear.  She seems to have failed conservative management and is quite frustrated.  Doubt DVT.  Offered her intra-articular injection with bupivacaine and morphine which  she accepted.  Following injection her pain is nearly completely resolved.  I have advised her to return to her orthopedic surgeon to consider further treatment as she seems to have failed conservative therapy.  She declines further pain medication and her intra-articular morphine should hopefully cover her for the next week or so.  She is discharged home in significantly improved condition.      ____________________________________________   FINAL CLINICAL IMPRESSION(S) / ED DIAGNOSES  Final diagnoses:  Internal derangement of left knee      NEW MEDICATIONS STARTED DURING THIS VISIT:  New Prescriptions   No medications on file     Note:  This document was prepared using Dragon voice recognition software and may include unintentional dictation errors.      Merrily Brittle, MD 03/01/18 [phone]

## 2018-03-01 NOTE — ED Notes (Signed)
Pt states that pain in left knee has "not gotten better in the last month." Pt states she has seen a PA but "they didn't do anything to help me." Pt pleasant and cooperative. No distress at this time.

## 2018-03-01 NOTE — ED Triage Notes (Signed)
Left knee pain and pain radiating into other areas of leg.  Patient was seen in ed for the same and received an injection in knee and pain is no better.

## 2018-03-01 NOTE — ED Notes (Signed)
Pt verbalized understanding of discharge instructions. NAD at this time. 

## 2018-03-02 NOTE — ED Provider Notes (Signed)
[facility]    CSN: [id number] Arrival date & time: 03/01/18  1649     History   Chief Complaint Chief Complaint  Patient presents with  . Knee Pain    HPI Melissa Chapman is a 32 y.o. female.   Pt is a 32 year old female that presents with severe left knee pain. This has been constant and worsening over the past few weeks. She initially sustained twisting injury. There is some popping and clicking. She was just seen in the ER today where she was given an intraarticular injection with bupivacaine and morphine. She was dx with possible meniscus injury and given a knee brace to wear and told follow up with ortho. She is still having severe pain despite treatment in the ER and called ortho she was referred to but they were closed. She is very tearful and unable to lay still due to the pain. Rates 10/10. describes it as throbbing and she cannot get comfortable. Denies any numbness, tingling. She is able to ambulate with pain.   ROS per HPI    Knee Pain    Past Medical History:  Diagnosis Date  . Anxiety and depression   . Chronic hypokalemia   . Essential hypertension   . Pancreatitis     Patient Active Problem List   Diagnosis Date Noted  . Chronic hypokalemia 10/11/2017  . Preventative health care 10/11/2017  . Hypertriglyceridemia 10/11/2017  . Elevated bilirubin 11/21/2016  . Migraine without status migrainosus, not intractable 11/15/2016  . Essential hypertension 10/22/2016  . Anxiety and depression 10/22/2016    History reviewed. No pertinent surgical history.  OB History   None      Home Medications    Prior to Admission medications   Medication Sig Start Date End Date Taking? Authorizing Provider  Fexofenadine HCl (MUCINEX ALLERGY PO) Take by mouth.   Yes [provider]  acetaminophen (TYLENOL) 325 MG tablet Take 650 mg by mouth every 6 (six) hours as needed.    [provider]  amLODipine (NORVASC) 10 MG tablet Take 1  tablet by mouth once daily for blood pressure. 10/11/17   Doreene Nest, NP  buPROPion (WELLBUTRIN SR) 150 MG 12 hr tablet Take 1 tablet (150 mg total) by mouth 2 (two) times daily. 11/25/17   Doreene Nest, NP  naproxen (NAPROSYN) 500 MG tablet Take 500 mg by mouth 2 (two) times daily with a meal.    [provider]  oxycodone-acetaminophen (PERCOCET) 2.5-325 MG tablet Take 1 tablet by mouth every 4 (four) hours as needed for pain.    [provider]  traMADol (ULTRAM) 50 MG tablet Take 1 tablet (50 mg total) by mouth every 6 (six) hours as needed. 03/01/18   Janace Aris, NP    Family History Family History  Problem Relation Age of Onset  . Depression Mother   . Hypertension Mother   . Hyperlipidemia Mother   . Hypertension Father   . Diabetes Father   . Hyperlipidemia Father   . Depression Sister   . Cancer Paternal Grandmother        Sinus     Social History Social History   Tobacco Use  . Smoking status: Never Smoker  . Smokeless tobacco: Never Used  Substance Use Topics  . Alcohol use: Yes  . Drug use: No     Allergies   Patient has no known allergies.   Review of Systems Review of Systems   Physical Exam Triage  Vital Signs ED Triage Vitals  Enc Vitals Group     BP 03/01/18 1745 117/80     Pulse Rate 03/01/18 1745 91     Resp 03/01/18 1745 18     Temp 03/01/18 1745 98.6 F (37 C)     Temp Source 03/01/18 1745 Oral     SpO2 03/01/18 1745 100 %     Weight --      Height --      Head Circumference --      Peak Flow --      Pain Score 03/01/18 1742 8     Pain Loc --      Pain Edu? --      Excl. in GC? --    No data found.  Updated Vital Signs BP 117/80 (BP Location: Right Arm)   Pulse 91   Temp 98.6 F (37 C) (Oral)   Resp 18   SpO2 100%   Visual Acuity Right Eye Distance:   Left Eye Distance:   Bilateral Distance:    Right Eye Near:   Left Eye Near:    Bilateral Near:     Physical Exam  Constitutional: She  appears well-developed and well-nourished.  Pt crying and very upset  Rolling around on the exam table   HENT:  Head: Normocephalic.  Eyes: Conjunctivae are normal.  Pulmonary/Chest: Effort normal.  Musculoskeletal:  Knee brace in place. Pt holding the left leg.   Neurological: She is alert.  Skin: Skin is warm and dry.  Nursing note and vitals reviewed.    UC Treatments / Results  Labs (all labs ordered are listed, but only abnormal results are displayed) Labs Reviewed - No data to display  EKG None  Radiology Dg Knee Complete 4 Views Left  Result Date: 03/01/2018 CLINICAL DATA:  Left knee pain. Patient reports persistent pain after fall 1-2 months ago. EXAM: LEFT KNEE - COMPLETE 4+ VIEW COMPARISON:  Report from left knee radiographs 01/16/2018, images not available. FINDINGS: No evidence of fracture, dislocation, or joint effusion. No evidence of arthropathy or other focal bone abnormality. Soft tissues are unremarkable. IMPRESSION: Negative radiographs of the left knee. Electronically Signed   By: Narda RutherfordMelanie  Sanford M.D.   On: 03/01/2018 06:01    Procedures Procedures (including critical care time)  Medications Ordered in UC Medications  HYDROcodone-acetaminophen (NORCO/VICODIN) 5-325 MG per tablet 1 tablet (1 tablet Oral Given 03/01/18 1811)  ketorolac (TORADOL) injection 60 mg (60 mg Intramuscular Given 03/01/18 1813)    Initial Impression / Assessment and Plan / UC Course  I have reviewed the triage vital signs and the nursing notes.  Pertinent labs & imaging results that were available during my care of the patient were reviewed by me and considered in my medical decision making (see chart for details).     Pt was just seen in the ER and had xray which was normal. She is presumed to have some sort of meniscus injury based on symptoms and mechanism. She was treated in the ER with knee injection which did not help her pain. She has taken percocet previously and  naproxen which she reports also does not help her pain.  She is currently wearing a knee brace and rolling around on the exam table in pain.  She called orthopedics and they were closed.  We will try a Toradol injection in clinic and hydrocodone for pain Sending some tramadol to the pharmacy for her to try instead of the percocet to see if  this helps.  If she is still in pain in the morning she was instructed to go across the street to [place] [facility] [place] urgent care that opens at 10 am.  Pt understanding and agreed.  Final Clinical Impressions(s) / UC Diagnoses   Final diagnoses:  Acute pain of left knee     Discharge Instructions     We will give you a Toradol injection and hydrocodone here for pain Please follow-up with Eulah Pont and Thurston Hole in the morning at 10 AM at the urgent care clinic if you are still having symptoms I am also sending some tramadol to the pharmacy to use as needed for pain    ED Prescriptions    Medication Sig Dispense Auth. Provider   traMADol (ULTRAM) 50 MG tablet Take 1 tablet (50 mg total) by mouth every 6 (six) hours as needed. 12 tablet Dahlia Byes A, NP     Controlled Substance Prescriptions [place] Controlled Substance Registry consulted? yes   Janace Aris, NP 03/02/18 [phone]

## 2018-03-04 ENCOUNTER — Other Ambulatory Visit: Payer: Self-pay | Admitting: Sports Medicine

## 2018-03-04 DIAGNOSIS — G8929 Other chronic pain: Secondary | ICD-10-CM

## 2018-03-04 DIAGNOSIS — M25562 Pain in left knee: Principal | ICD-10-CM

## 2018-03-07 ENCOUNTER — Encounter (HOSPITAL_COMMUNITY): Payer: Self-pay

## 2018-03-07 ENCOUNTER — Ambulatory Visit (HOSPITAL_COMMUNITY)
Admission: RE | Admit: 2018-03-07 | Discharge: 2018-03-07 | Disposition: A | Discharge status: Home / Self Care | Payer: 59 | Source: Ambulatory Visit | Attending: Sports Medicine | Admitting: Sports Medicine

## 2018-03-07 NOTE — Progress Notes (Signed)
Patient was scheduled for  MRI Left Knee w/o today at 7am. Patient was a no call no show. fax sent to office to let MD be aware, Md office also called.  bhj

## 2018-04-11 ENCOUNTER — Encounter (HOSPITAL_COMMUNITY): Payer: Self-pay | Admitting: Emergency Medicine

## 2018-04-11 ENCOUNTER — Emergency Department (HOSPITAL_COMMUNITY)
Admission: EM | Admit: 2018-04-11 | Discharge: 2018-04-12 | Disposition: A | Discharge status: Home / Self Care | Payer: 59 | Attending: Emergency Medicine | Admitting: Emergency Medicine

## 2018-04-11 ENCOUNTER — Other Ambulatory Visit: Payer: Self-pay

## 2018-04-11 DIAGNOSIS — I10 Essential (primary) hypertension: Secondary | ICD-10-CM | POA: Insufficient documentation

## 2018-04-11 DIAGNOSIS — R55 Syncope and collapse: Secondary | ICD-10-CM

## 2018-04-11 DIAGNOSIS — Z79899 Other long term (current) drug therapy: Secondary | ICD-10-CM | POA: Insufficient documentation

## 2018-04-11 NOTE — ED Triage Notes (Signed)
Per EMS no hx of  Seizures.

## 2018-04-11 NOTE — ED Triage Notes (Signed)
BIB [facility] after family witness pt have seizure. Per EMS family described clonic tonic with stiffing afterwards. Pt had 1 episode of emesis in route and was given 4 mg Zofran with relief. A&O X 4 on arrival.

## 2018-04-12 LAB — CBC WITH DIFFERENTIAL/PLATELET
ABS IMMATURE GRANULOCYTES: 0.06 10*3/uL (ref 0.00–0.07)
Basophils Absolute: 0 10*3/uL (ref 0.0–0.1)
Basophils Relative: 0 %
EOS ABS: 0 10*3/uL (ref 0.0–0.5)
Eosinophils Relative: 0 %
HEMATOCRIT: 30.3 % — AB (ref 36.0–46.0)
Hemoglobin: 10.2 g/dL — ABNORMAL LOW (ref 12.0–15.0)
Immature Granulocytes: 1 %
LYMPHS ABS: 1 10*3/uL (ref 0.7–4.0)
LYMPHS PCT: 8 %
MCH: 37.4 pg — ABNORMAL HIGH (ref 26.0–34.0)
MCHC: 33.7 g/dL (ref 30.0–36.0)
MCV: 111 fL — ABNORMAL HIGH (ref 80.0–100.0)
MONO ABS: 0.7 10*3/uL (ref 0.1–1.0)
MONOS PCT: 6 %
Neutro Abs: 10 10*3/uL — ABNORMAL HIGH (ref 1.7–7.7)
Neutrophils Relative %: 85 %
Platelets: 221 10*3/uL (ref 150–400)
RBC: 2.73 MIL/uL — ABNORMAL LOW (ref 3.87–5.11)
RDW: 15.2 % (ref 11.5–15.5)
WBC: 11.8 10*3/uL — ABNORMAL HIGH (ref 4.0–10.5)
nRBC: 0 % (ref 0.0–0.2)

## 2018-04-12 LAB — BASIC METABOLIC PANEL
ANION GAP: 10 (ref 5–15)
CO2: 24 mmol/L (ref 22–32)
Calcium: 8.9 mg/dL (ref 8.9–10.3)
Chloride: 101 mmol/L (ref 98–111)
Creatinine, Ser: 0.91 mg/dL (ref 0.44–1.00)
GFR calc Af Amer: >60 mL/min (ref >60)
GFR calc non Af Amer: >60 mL/min (ref >60)
GLUCOSE: 138 mg/dL — AB (ref 70–99)
POTASSIUM: 3.3 mmol/L — AB (ref 3.5–5.1)
SODIUM: 135 mmol/L (ref 135–145)

## 2018-04-12 LAB — I-STAT BETA HCG BLOOD, ED (MC, WL, AP ONLY)

## 2018-04-12 LAB — CBG MONITORING, ED: GLUCOSE-CAPILLARY: 130 mg/dL — AB (ref 70–99)

## 2018-04-12 MED ORDER — MAGNESIUM OXIDE 400 (241.3 MG) MG PO TABS
800.0000 mg | ORAL_TABLET | Freq: Once | ORAL | Status: AC
  Administered 2018-04-12: 800 mg via ORAL
  Filled 2018-04-12: qty 2

## 2018-04-12 MED ORDER — SODIUM CHLORIDE 0.9 % IV BOLUS
1000.0000 mL | Freq: Once | INTRAVENOUS | Status: AC
  Administered 2018-04-12: 1000 mL via INTRAVENOUS

## 2018-04-12 MED ORDER — POTASSIUM CHLORIDE CRYS ER 20 MEQ PO TBCR
40.0000 meq | EXTENDED_RELEASE_TABLET | Freq: Once | ORAL | Status: AC
  Administered 2018-04-12: 40 meq via ORAL
  Filled 2018-04-12: qty 2

## 2018-04-12 MED ORDER — ACETAMINOPHEN 500 MG PO TABS
1000.0000 mg | ORAL_TABLET | Freq: Once | ORAL | Status: AC
  Administered 2018-04-12: 1000 mg via ORAL
  Filled 2018-04-12: qty 2

## 2018-04-12 NOTE — ED Notes (Signed)
Pt verbalized understanding of dc instructions, vss, ambulatory with nad upon discharge 

## 2018-04-12 NOTE — Discharge Instructions (Signed)
Eat and drink well for the next couple of days.  Return for new symptoms or recurrent event.   Stop taking tramadol as it can make you have seizures.

## 2018-04-12 NOTE — ED Provider Notes (Signed)
[facility] EMERGENCY DEPARTMENT Provider Note   CSN: [id number] Arrival date & time: 04/11/18  2351     History   Chief Complaint Chief Complaint  Patient presents with  . Seizures    HPI Melissa Chapman is a 33 y.o. female.  47 y oF with a chief complaint of a loss of consciousness and some shaking.  The patient was sitting on the couch when she stood up and then collapsed to the ground.  Per bystanders she had some focal jerking that lasted for about 2 minutes and then resolved.  She was quickly back to baseline but was confused about her scenario.  She bit her lip denies loss of bowel or bladder.  No prior history of seizures.  Stated that she had been doing well today.  Eating and drinking well no vomiting or diarrhea no fevers no dark stool or blood in her stool no significant vaginal bleeding.  She has had a very mild cough.  The history is provided by the patient.  Seizures  Illness  Severity:  Mild Onset quality:  Sudden Duration:  2 minutes Timing:  Rare Progression:  Resolved Chronicity:  New Associated symptoms: no chest pain, no congestion, no fever, no headaches, no myalgias, no nausea, no rhinorrhea, no shortness of breath, no vomiting and no wheezing     Past Medical History:  Diagnosis Date  . Anxiety and depression   . Chronic hypokalemia   . Essential hypertension   . Pancreatitis     Patient Active Problem List   Diagnosis Date Noted  . Chronic hypokalemia 10/11/2017  . Preventative health care 10/11/2017  . Hypertriglyceridemia 10/11/2017  . Elevated bilirubin 11/21/2016  . Migraine without status migrainosus, not intractable 11/15/2016  . Essential hypertension 10/22/2016  . Anxiety and depression 10/22/2016    History reviewed. No pertinent surgical history.   OB History   No obstetric history on file.      Home Medications    Prior to Admission medications   Medication Sig Start Date End Date Taking? Authorizing  Provider  acetaminophen (TYLENOL) 325 MG tablet Take 650 mg by mouth every 6 (six) hours as needed.    [provider]  amLODipine (NORVASC) 10 MG tablet Take 1 tablet by mouth once daily for blood pressure. 10/11/17   Doreene Nest, NP  buPROPion (WELLBUTRIN SR) 150 MG 12 hr tablet Take 1 tablet (150 mg total) by mouth 2 (two) times daily. 11/25/17   Doreene Nest, NP  Fexofenadine HCl [facility] ALLERGY PO) Take by mouth.    [provider]  naproxen (NAPROSYN) 500 MG tablet Take 500 mg by mouth 2 (two) times daily with a meal.    [provider]  oxycodone-acetaminophen (PERCOCET) 2.5-325 MG tablet Take 1 tablet by mouth every 4 (four) hours as needed for pain.    [provider]  traMADol (ULTRAM) 50 MG tablet Take 1 tablet (50 mg total) by mouth every 6 (six) hours as needed. 03/01/18   Janace Aris, NP    Family History Family History  Problem Relation Age of Onset  . Depression Mother   . Hypertension Mother   . Hyperlipidemia Mother   . Hypertension Father   . Diabetes Father   . Hyperlipidemia Father   . Depression Sister   . Cancer Paternal Grandmother        Sinus     Social History Social History   Tobacco Use  . Smoking status: Never Smoker  .  Smokeless tobacco: Never Used  Substance Use Topics  . Alcohol use: Yes  . Drug use: No     Allergies   Patient has no known allergies.   Review of Systems Review of Systems  Constitutional: Negative for chills and fever.  HENT: Negative for congestion and rhinorrhea.   Eyes: Negative for redness and visual disturbance.  Respiratory: Negative for shortness of breath and wheezing.   Cardiovascular: Negative for chest pain and palpitations.  Gastrointestinal: Negative for nausea and vomiting.  Genitourinary: Negative for dysuria and urgency.  Musculoskeletal: Negative for arthralgias and myalgias.  Skin: Negative for pallor and wound.  Neurological: Positive for seizures.  Negative for dizziness and headaches.     Physical Exam Updated Vital Signs BP 135/90   Pulse 98   Resp 18   SpO2 100%   Physical Exam Vitals signs and nursing note reviewed.  Constitutional:      General: She is not in acute distress.    Appearance: She is well-developed. She is not diaphoretic.  HENT:     Head: Normocephalic and atraumatic.  Eyes:     Pupils: Pupils are equal, round, and reactive to light.  Neck:     Musculoskeletal: Normal range of motion and neck supple.  Cardiovascular:     Rate and Rhythm: Regular rhythm. Tachycardia present.     Heart sounds: No murmur. No friction rub. No gallop.   Pulmonary:     Effort: Pulmonary effort is normal.     Breath sounds: No wheezing or rales.  Abdominal:     General: There is no distension.     Palpations: Abdomen is soft.     Tenderness: There is no abdominal tenderness.  Musculoskeletal:        General: No tenderness.  Skin:    General: Skin is warm and dry.  Neurological:     Mental Status: She is alert and oriented to person, place, and time.     Cranial Nerves: Cranial nerves are intact.     Sensory: Sensation is intact.     Motor: Motor function is intact.     Coordination: Coordination is intact.     Gait: Gait is intact.     Comments: Benign neuro exam  Psychiatric:        Behavior: Behavior normal.      ED Treatments / Results  Labs (all labs ordered are listed, but only abnormal results are displayed) Labs Reviewed  CBC WITH DIFFERENTIAL/PLATELET - Abnormal; Notable for the following components:      Result Value   WBC 11.8 (*)    RBC 2.73 (*)    Hemoglobin 10.2 (*)    HCT 30.3 (*)    MCV 111.0 (*)    MCH 37.4 (*)    Neutro Abs 10.0 (*)    All other components within normal limits  BASIC METABOLIC PANEL - Abnormal; Notable for the following components:   Potassium 3.3 (*)    Glucose, Bld 138 (*)    BUN <5 (*)    All other components within normal limits  CBG MONITORING, ED - Abnormal;  Notable for the following components:   Glucose-Capillary 130 (*)    All other components within normal limits  I-STAT BETA HCG BLOOD, ED ([facility], [facility], [facility] ONLY)    EKG EKG Interpretation  Date/Time:  Saturday April 12 2018 00:26:52 EST Ventricular Rate:  119 PR Interval:    QRS Duration: 77 QT Interval:  316 QTC Calculation: 445 R Axis:  71 Text Interpretation:  Sinus tachycardia Borderline T abnormalities, anterior leads Since last tracing rate faster Otherwise no significant change Confirmed by Melene Plan [phone]) on 04/12/2018 12:29:45 AM   Radiology No results found.  Procedures Procedures (including critical care time)  Medications Ordered in ED Medications  sodium chloride 0.9 % bolus 1,000 mL (0 mLs Intravenous Stopped 04/12/18 0128)  acetaminophen (TYLENOL) tablet 1,000 mg (1,000 mg Oral Given 04/12/18 0056)  potassium chloride SA (K-DUR,KLOR-CON) CR tablet 40 mEq (40 mEq Oral Given 04/12/18 0113)  magnesium oxide (MAG-OX) tablet 800 mg (800 mg Oral Given 04/12/18 0111)     Initial Impression / Assessment and Plan / ED Course  I have reviewed the triage vital signs and the nursing notes.  Pertinent labs & imaging results that were available during my care of the patient were reviewed by me and considered in my medical decision making (see chart for details).     33 yo F with a chief complaint of loss of consciousness and some shaking.  By history this sounds more like a syncopal event and a seizure.  The patient is tachycardic into the 130s on my exam.  Will obtain a laboratory evaluation.  The patient is not pregnant and her anemia is at baseline.  We will give a bolus of IV fluids and reassess.  Patient continues to be asymptomatic on reassessment.  Her tachycardia has resolved with a bolus of IV fluids.  Suspect that she was dehydrated.  We will have her eat and drink well for the next couple days.  PCP follow-up.  3:09 AM:  I have discussed the  diagnosis/risks/treatment options with the patient and family and believe the pt to be eligible for discharge home to follow-up with PCP. We also discussed returning to the ED immediately if new or worsening sx occur. We discussed the sx which are most concerning (e.g., sudden worsening pain, fever, inability to tolerate by mouth) that necessitate immediate return. Medications administered to the patient during their visit and any new prescriptions provided to the patient are listed below.  Medications given during this visit Medications  sodium chloride 0.9 % bolus 1,000 mL (0 mLs Intravenous Stopped 04/12/18 0128)  acetaminophen (TYLENOL) tablet 1,000 mg (1,000 mg Oral Given 04/12/18 0056)  potassium chloride SA (K-DUR,KLOR-CON) CR tablet 40 mEq (40 mEq Oral Given 04/12/18 0113)  magnesium oxide (MAG-OX) tablet 800 mg (800 mg Oral Given 04/12/18 0111)      The patient appears reasonably screen and/or stabilized for discharge and I doubt any other medical condition or other [facility] requiring further screening, evaluation, or treatment in the ED at this time prior to discharge.   Final Clinical Impressions(s) / ED Diagnoses   Final diagnoses:  Syncope and collapse    ED Discharge Orders    None       Melene Plan, DO 04/12/18 0309

## 2018-06-18 ENCOUNTER — Encounter: Payer: Self-pay | Admitting: *Deleted

## 2018-06-18 ENCOUNTER — Other Ambulatory Visit: Payer: Self-pay

## 2018-06-18 ENCOUNTER — Emergency Department
Admission: EM | Admit: 2018-06-18 | Discharge: 2018-06-18 | Disposition: A | Discharge status: Home / Self Care | Payer: BLUE CROSS/BLUE SHIELD | Attending: Emergency Medicine | Admitting: Emergency Medicine

## 2018-06-18 DIAGNOSIS — K529 Noninfective gastroenteritis and colitis, unspecified: Secondary | ICD-10-CM

## 2018-06-18 DIAGNOSIS — R112 Nausea with vomiting, unspecified: Secondary | ICD-10-CM | POA: Diagnosis present

## 2018-06-18 DIAGNOSIS — I10 Essential (primary) hypertension: Secondary | ICD-10-CM | POA: Diagnosis not present

## 2018-06-18 DIAGNOSIS — R197 Diarrhea, unspecified: Secondary | ICD-10-CM | POA: Diagnosis not present

## 2018-06-18 LAB — CBC
HEMATOCRIT: 29.3 % — AB (ref 36.0–46.0)
HEMOGLOBIN: 10.2 g/dL — AB (ref 12.0–15.0)
MCH: 35.1 pg — ABNORMAL HIGH (ref 26.0–34.0)
MCHC: 34.8 g/dL (ref 30.0–36.0)
MCV: 100.7 fL — ABNORMAL HIGH (ref 80.0–100.0)
Platelets: 123 10*3/uL — ABNORMAL LOW (ref 150–400)
RBC: 2.91 MIL/uL — AB (ref 3.87–5.11)
RDW: 20.9 % — ABNORMAL HIGH (ref 11.5–15.5)
WBC: 4.8 10*3/uL (ref 4.0–10.5)
nRBC: 0 % (ref 0.0–0.2)

## 2018-06-18 LAB — COMPREHENSIVE METABOLIC PANEL
ALBUMIN: 3.9 g/dL (ref 3.5–5.0)
ALT: 30 U/L (ref 0–44)
AST: 99 U/L — ABNORMAL HIGH (ref 15–41)
Alkaline Phosphatase: 91 U/L (ref 38–126)
Anion gap: 12 (ref 5–15)
BUN: 7 mg/dL (ref 6–20)
CHLORIDE: 97 mmol/L — AB (ref 98–111)
CO2: 28 mmol/L (ref 22–32)
Calcium: 8.6 mg/dL — ABNORMAL LOW (ref 8.9–10.3)
Creatinine, Ser: 0.6 mg/dL (ref 0.44–1.00)
GFR calc Af Amer: >60 mL/min (ref >60)
GFR calc non Af Amer: >60 mL/min (ref >60)
Glucose, Bld: 122 mg/dL — ABNORMAL HIGH (ref 70–99)
Potassium: 2.9 mmol/L — ABNORMAL LOW (ref 3.5–5.1)
Sodium: 137 mmol/L (ref 135–145)
Total Bilirubin: 1.5 mg/dL — ABNORMAL HIGH (ref 0.3–1.2)
Total Protein: 7.6 g/dL (ref 6.5–8.1)

## 2018-06-18 LAB — URINALYSIS, COMPLETE (UACMP) WITH MICROSCOPIC
BACTERIA UA: NONE SEEN
Bilirubin Urine: NEGATIVE
Glucose, UA: NEGATIVE mg/dL
Hgb urine dipstick: NEGATIVE
Ketones, ur: 20 mg/dL — AB
LEUKOCYTE UA: NEGATIVE
Nitrite: NEGATIVE
Protein, ur: 30 mg/dL — AB
Specific Gravity, Urine: 1.025 (ref 1.005–1.030)
pH: 6 (ref 5.0–8.0)

## 2018-06-18 LAB — POCT PREGNANCY, URINE: Preg Test, Ur: NEGATIVE

## 2018-06-18 LAB — LIPASE, BLOOD: Lipase: 26 U/L (ref 11–51)

## 2018-06-18 MED ORDER — ONDANSETRON HCL 4 MG/2ML IJ SOLN
4.0000 mg | Freq: Once | INTRAMUSCULAR | Status: AC
  Administered 2018-06-18: 4 mg via INTRAVENOUS
  Filled 2018-06-18: qty 2

## 2018-06-18 MED ORDER — ONDANSETRON HCL 4 MG PO TABS: 4.0000 mg | ORAL_TABLET | Freq: Three times a day (TID) | ORAL | 0 refills | Status: DC | PRN

## 2018-06-18 MED ORDER — POTASSIUM CHLORIDE CRYS ER 20 MEQ PO TBCR
40.0000 meq | EXTENDED_RELEASE_TABLET | Freq: Once | ORAL | Status: AC
  Administered 2018-06-18: 40 meq via ORAL
  Filled 2018-06-18: qty 2

## 2018-06-18 MED ORDER — DICYCLOMINE HCL 20 MG PO TABS: 20.0000 mg | ORAL_TABLET | Freq: Three times a day (TID) | ORAL | 0 refills | Status: DC | PRN

## 2018-06-18 MED ORDER — SODIUM CHLORIDE 0.9 % IV BOLUS: 1000.0000 mL | Freq: Once | INTRAVENOUS | Status: DC

## 2018-06-18 NOTE — ED Triage Notes (Signed)
Pt has n/v/d for 2 days.  Vomited x 4 today.  States stomach is sore from throwing up today.  Pt alert  Speech clear.

## 2018-06-18 NOTE — ED Provider Notes (Signed)
[facility] Emergency Department Provider Note   ____________________________________________   I have reviewed the triage vital signs and the nursing notes.   HISTORY  Chief Complaint Emesis and Diarrhea   History limited by: Not Limited   HPI Melissa Chapman is a 33 y.o. female who presents to the emergency department today because of concerns for nausea vomiting diarrhea.  Patient states symptoms started 2 days ago.  She has had multiple episodes of nonbloody emesis and nonbloody diarrhea.  She states today the vomiting has actually slowed down.  She is continued to have watery diarrhea.  This has been accompanied by some abdominal discomfort.  She denies any unusual ingestions.  She denies any known sick contacts.  Denies any history of IBS, Crohn's or other intestinal disorder.  She denies any fevers.   Records reviewed. Per medical record review patient has a history of pancreatitis.   Past Medical History:  Diagnosis Date  . Anxiety and depression   . Chronic hypokalemia   . Essential hypertension   . Pancreatitis     Patient Active Problem List   Diagnosis Date Noted  . Chronic hypokalemia 10/11/2017  . Preventative health care 10/11/2017  . Hypertriglyceridemia 10/11/2017  . Elevated bilirubin 11/21/2016  . Migraine without status migrainosus, not intractable 11/15/2016  . Essential hypertension 10/22/2016  . Anxiety and depression 10/22/2016    No past surgical history on file.  Prior to Admission medications   Medication Sig Start Date End Date Taking? Authorizing Provider  acetaminophen (TYLENOL) 325 MG tablet Take 650 mg by mouth every 6 (six) hours as needed.    [provider]  amLODipine (NORVASC) 10 MG tablet Take 1 tablet by mouth once daily for blood pressure. 10/11/17   Doreene Nest, NP  buPROPion (WELLBUTRIN SR) 150 MG 12 hr tablet Take 1 tablet (150 mg total) by mouth 2 (two) times daily. 11/25/17   Doreene Nest, NP  Fexofenadine HCl [facility] ALLERGY PO) Take by mouth.    [provider]  naproxen (NAPROSYN) 500 MG tablet Take 500 mg by mouth 2 (two) times daily with a meal.    [provider]  oxycodone-acetaminophen (PERCOCET) 2.5-325 MG tablet Take 1 tablet by mouth every 4 (four) hours as needed for pain.    [provider]  traMADol (ULTRAM) 50 MG tablet Take 1 tablet (50 mg total) by mouth every 6 (six) hours as needed. 03/01/18   Janace Aris, NP    Allergies Patient has no known allergies.  Family History  Problem Relation Age of Onset  . Depression Mother   . Hypertension Mother   . Hyperlipidemia Mother   . Hypertension Father   . Diabetes Father   . Hyperlipidemia Father   . Depression Sister   . Cancer Paternal Grandmother        Sinus     Social History Social History   Tobacco Use  . Smoking status: Never Smoker  . Smokeless tobacco: Never Used  Substance Use Topics  . Alcohol use: Yes  . Drug use: No    Review of Systems Constitutional: No fever/chills Eyes: No visual changes. ENT: No sore throat. Cardiovascular: Denies chest pain. Respiratory: Denies shortness of breath. Gastrointestinal: Positive for abdominal discomfort, nausea, vomiting and diarrhea.  Genitourinary: Negative for dysuria. Musculoskeletal: Negative for back pain. Skin: Negative for rash. Neurological: Negative for headaches, focal weakness or numbness.  ____________________________________________   PHYSICAL EXAM:  VITAL SIGNS: ED Triage Vitals  Enc  Vitals Group     BP 06/18/18 1856 (!) 161/105     Pulse Rate 06/18/18 1855 (!) 104     Resp 06/18/18 1855 20     Temp 06/18/18 1855 99.2 F (37.3 C)     Temp Source 06/18/18 1855 Oral     SpO2 06/18/18 1855 100 %     Weight 06/18/18 1856 130 lb (59 kg)     Height 06/18/18 1856 5\' 6"  (1.676 m)     Head Circumference --      Peak Flow --      Pain Score 06/18/18 1856 0   Constitutional: Alert  and oriented.  Eyes: Conjunctivae are normal.  ENT      Head: Normocephalic and atraumatic.      Nose: No congestion/rhinnorhea.      Mouth/Throat: Mucous membranes are moist.      Neck: No stridor. Hematological/Lymphatic/Immunilogical: No cervical lymphadenopathy. Cardiovascular: Tachycardic, regular rhythm.  No murmurs, rubs, or gallops. Respiratory: Normal respiratory effort without tachypnea nor retractions. Breath sounds are clear and equal bilaterally. No wheezes/rales/rhonchi. Gastrointestinal: Soft and non tender. No rebound. No guarding.  Genitourinary: Deferred Musculoskeletal: Normal range of motion in all extremities. No lower extremity edema. Neurologic:  Normal speech and language. No gross focal neurologic deficits are appreciated.  Skin:  Skin is warm, dry and intact. No rash noted. Psychiatric: Mood and affect are normal. Speech and behavior are normal. Patient exhibits appropriate insight and judgment.  ____________________________________________    LABS (pertinent positives/negatives)  Upreg negative Lipase 26 CMP na 137, k 2.9, glu 122, cr 0.60 UA hazy, ketones 20, protein 30, rbc and wbc 0-5 CBC wbc 4.8, hgb 10.2, plt 123 ____________________________________________   EKG  None  ____________________________________________    RADIOLOGY  None  ____________________________________________   PROCEDURES  Procedures  ____________________________________________   INITIAL IMPRESSION / ASSESSMENT AND PLAN / ED COURSE  Pertinent labs & imaging results that were available during my care of the patient were reviewed by me and considered in my medical decision making (see chart for details).   Patient presented to the emergency department today because of concerns for nausea vomiting and diarrhea.  Differential would be broad including gastroenteritis, food poisoning, IBS, Crohn's, hepatitis amongst other etiologies.  Patient's blood work does show a  slight hypokalemia which is consistent with vomiting and diarrhea.  Patient mild tachycardia raising concerns for dehydration.  Otherwise no significant leukocytosis or other signs for concerning intra-abdominal pathology.  Patient did feel better after IV fluids and medication.  Will plan on discharging with antiemetics and Bentyl.  Discussed work-up and plan with patient.  ____________________________________________   FINAL CLINICAL IMPRESSION(S) / ED DIAGNOSES  Final diagnoses:  Nausea vomiting and diarrhea  Gastroenteritis     Note: This dictation was prepared with Dragon dictation. Any transcriptional errors that result from this process are unintentional     Phineas Semen, MD 06/18/18 2155

## 2018-06-18 NOTE — Discharge Instructions (Addendum)
Please seek medical attention for any high fevers, chest pain, shortness of breath, change in behavior, persistent vomiting, bloody stool or any other new or concerning symptoms.  

## 2018-08-04 ENCOUNTER — Encounter: Payer: BLUE CROSS/BLUE SHIELD | Admitting: Primary Care

## 2018-08-04 ENCOUNTER — Other Ambulatory Visit: Payer: Self-pay

## 2018-08-04 NOTE — Progress Notes (Deleted)
   Subjective:    Patient ID: Melissa Chapman, female    DOB: [birth year], 33 y.o.   MRN: [id number]  HPI  Virtual Visit via Video Note  I connected with Melissa Chapman on 08/04/18 at  3:00 PM EDT by a video enabled telemedicine application and verified that I am speaking with the correct person using two identifiers.  Location: Patient: *** Provider: ***   I discussed the limitations of evaluation and management by telemedicine and the availability of in person appointments. The patient expressed understanding and agreed to proceed.  History of Present Illness:  Melissa Chapman is a 33 year old female with a history of hypertension, anemia, hypokalemia who presents today with a chief complaint of syncope.  She was evaluated in the emergency department on 04/12/18 with a chief complaint of syncope with seizures. During her stay in the ED she was noted to be tachycardic that improved with IV fluids. Her work up was overall stable. She was presumed to have syncope secondary to dehydration so she was encouraged to hydrate and eat and was discharged home.      Observations/Objective:   Assessment and Plan:   Follow Up Instructions:    I discussed the assessment and treatment plan with the patient. The patient was provided an opportunity to ask questions and all were answered. The patient agreed with the plan and demonstrated an understanding of the instructions.   The patient was advised to call back or seek an in-person evaluation if the symptoms worsen or if the condition fails to improve as anticipated.  I provided *** minutes of non-face-to-face time during this encounter.   Doreene Nest, NP    Review of Systems     Objective:   Physical Exam         Assessment & Plan:

## 2018-08-05 NOTE — Progress Notes (Signed)
Patient never showed up for appointment

## 2018-08-23 ENCOUNTER — Other Ambulatory Visit: Payer: Self-pay | Admitting: Primary Care

## 2018-08-23 DIAGNOSIS — E876 Hypokalemia: Secondary | ICD-10-CM

## 2018-08-23 DIAGNOSIS — F329 Major depressive disorder, single episode, unspecified: Secondary | ICD-10-CM

## 2018-08-23 DIAGNOSIS — F419 Anxiety disorder, unspecified: Secondary | ICD-10-CM

## 2018-08-26 NOTE — Telephone Encounter (Signed)
She needs a follow up visit, okay to do this virtually. Please confirm once you've confirmed a follow up appointment date.

## 2018-08-26 NOTE — Telephone Encounter (Signed)
Last prescribed on 11/25/2017 . Last office visit on 11/25/2017. No future appointment

## 2018-08-27 NOTE — Telephone Encounter (Addendum)
Message left for patient to return my call to schedule appointment. 

## 2018-08-29 NOTE — Telephone Encounter (Signed)
Message left for patient to return my call.  

## 2018-09-01 NOTE — Telephone Encounter (Signed)
Sending letter

## 2018-12-03 LAB — HM PAP SMEAR

## 2018-12-10 IMAGING — DX DG KNEE COMPLETE 4+V*L*
4 series · 4 of 4 positions shown · non-contrast
Comparison: Report from left knee radiographs 01/16/2018, images
not available.

CLINICAL DATA: Left knee pain. Patient reports persistent pain
after fall 1-2 months ago.

EXAM:
LEFT KNEE - COMPLETE 4+ VIEW

[knee ap]
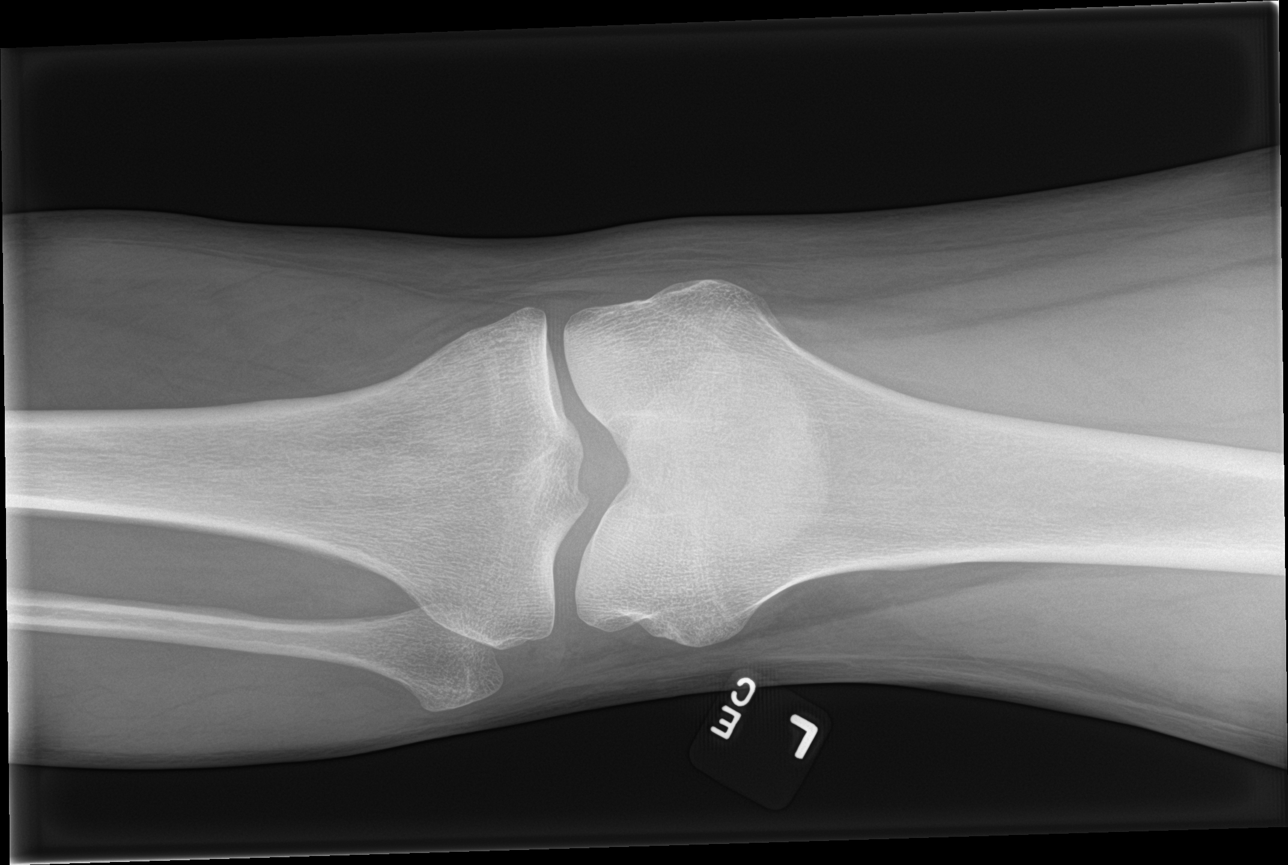

[knee obl (1 of 2)]
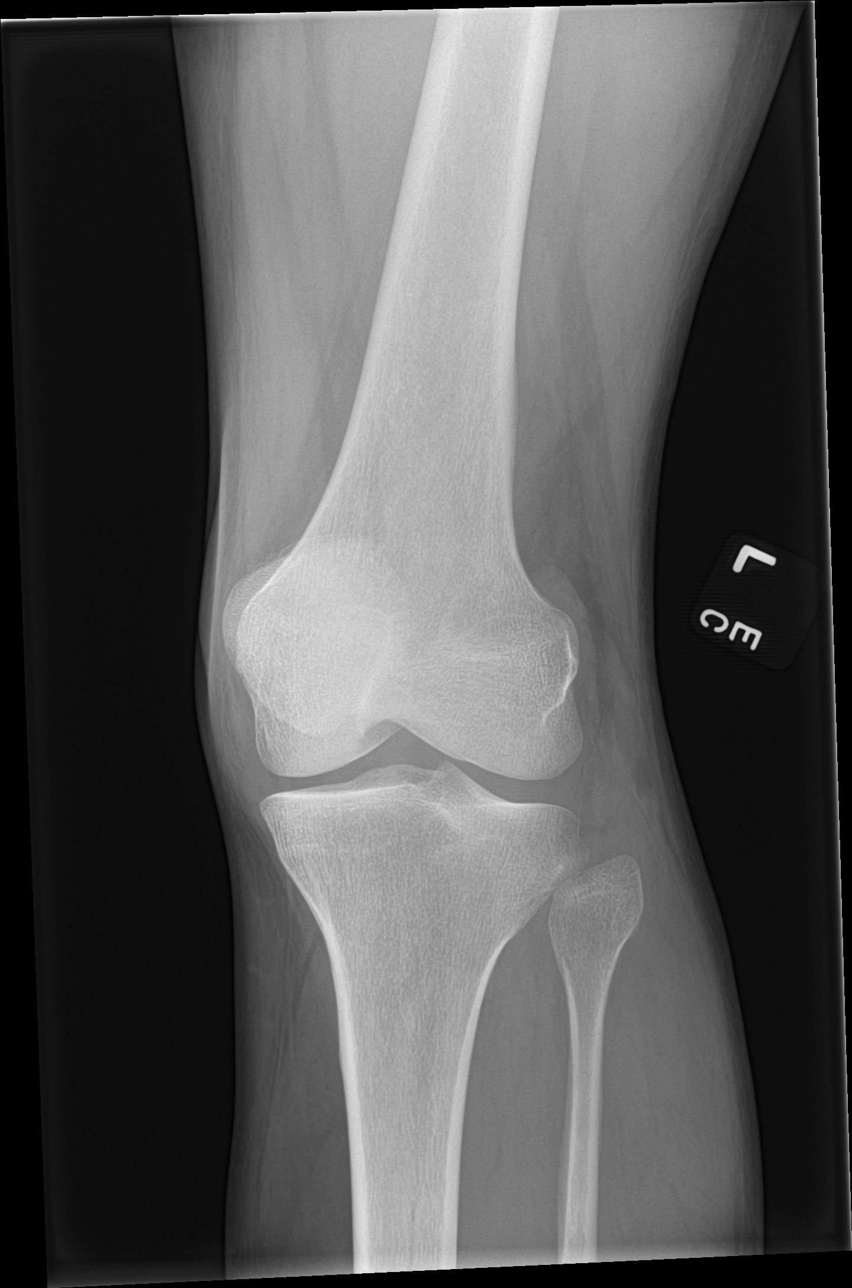

[knee lat]
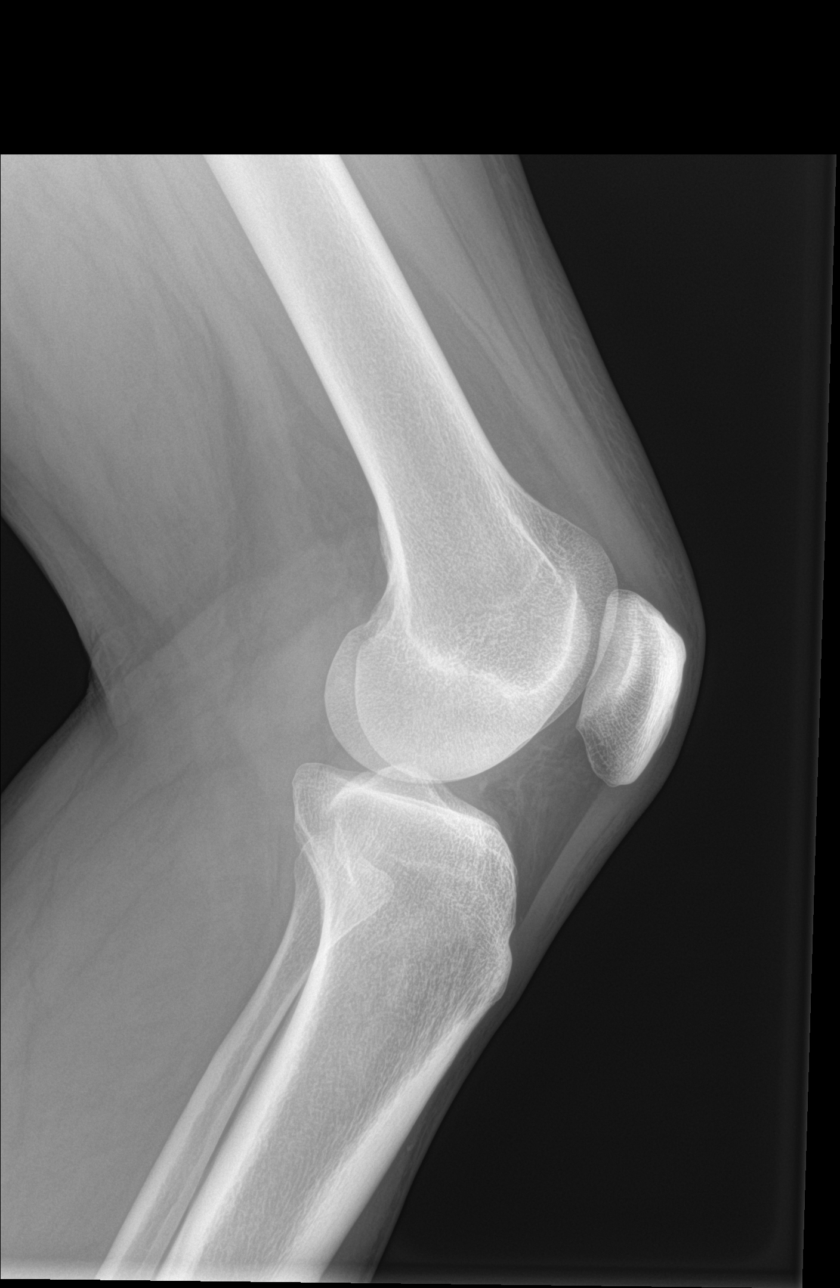

[knee obl (2 of 2)]
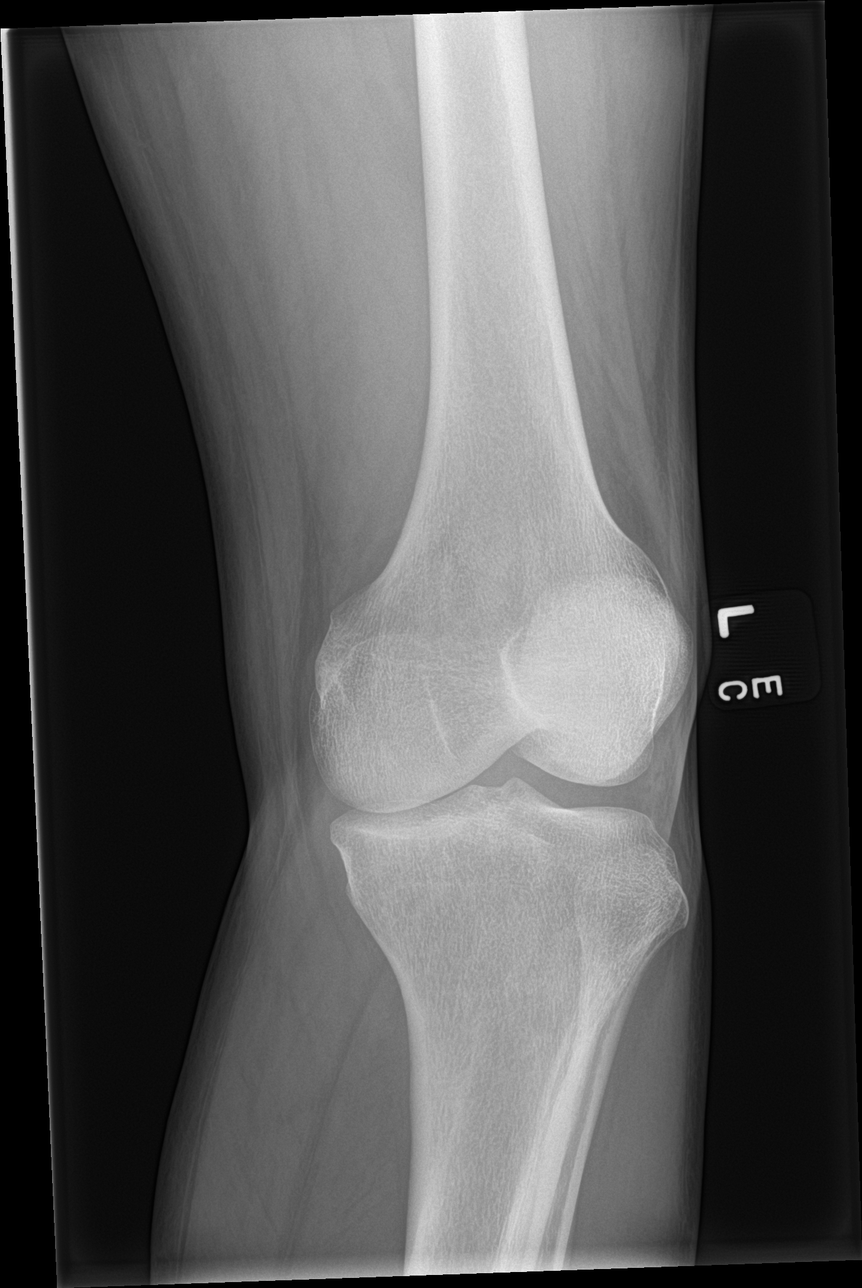

[4 of 4 positions shown; findings below may reference images not displayed]

FINDINGS: No evidence of fracture, dislocation, or joint effusion. No evidence
of arthropathy or other focal bone abnormality. Soft tissues are
unremarkable.
IMPRESSION: Negative radiographs of the left knee.

## 2019-06-24 ENCOUNTER — Ambulatory Visit: Payer: 59 | Attending: Internal Medicine

## 2019-06-24 DIAGNOSIS — Z20822 Contact with and (suspected) exposure to covid-19: Secondary | ICD-10-CM

## 2019-06-25 LAB — SARS-COV-2, NAA 2 DAY TAT

## 2019-06-25 LAB — NOVEL CORONAVIRUS, NAA: SARS-CoV-2, NAA: NOT DETECTED

## 2019-07-22 ENCOUNTER — Ambulatory Visit: Payer: 59 | Admitting: Primary Care

## 2019-07-22 ENCOUNTER — Telehealth: Payer: Self-pay

## 2019-07-22 NOTE — Telephone Encounter (Signed)
[facility] Night - Client Nonclinical Telephone Record AccessNurse Client [facility] Night - Client Client Site  [facility] - Night Physician Vernona Rieger - NP Contact Type Call Who Is Calling Patient / Member / Family / Caregiver Caller Name KAILAN CARMEN Caller Phone Number [phone] Patient Name [name] Patient DOB Apr 14, 1985 Call Type Message Only Information Provided Reason for Call Request to Reschedule Office Appointment Initial Comment Caller states that she is unable to make the apt. Additional Comment Caller states that she will call back after office reopens. Office hours provided. Disp. Time Disposition Final User 07/22/2019 6:05:36 AM General Information Provided Yes Donzetta Starch Call Closed By: Donzetta Starch Transaction Date/Time: 07/22/2019 6:02:23 AM (ET)

## 2019-07-27 ENCOUNTER — Encounter: Payer: Self-pay | Admitting: Primary Care

## 2019-07-27 ENCOUNTER — Ambulatory Visit (INDEPENDENT_AMBULATORY_CARE_PROVIDER_SITE_OTHER): Payer: 59 | Admitting: Primary Care

## 2019-07-27 ENCOUNTER — Other Ambulatory Visit: Payer: Self-pay

## 2019-07-27 VITALS — BP 140/86 | HR 129 | Temp 96.6°F | Ht 66.0 in | Wt 141.0 lb

## 2019-07-27 DIAGNOSIS — R55 Syncope and collapse: Secondary | ICD-10-CM

## 2019-07-27 DIAGNOSIS — F329 Major depressive disorder, single episode, unspecified: Secondary | ICD-10-CM

## 2019-07-27 DIAGNOSIS — E876 Hypokalemia: Secondary | ICD-10-CM

## 2019-07-27 DIAGNOSIS — F419 Anxiety disorder, unspecified: Secondary | ICD-10-CM

## 2019-07-27 DIAGNOSIS — R7303 Prediabetes: Secondary | ICD-10-CM

## 2019-07-27 DIAGNOSIS — I10 Essential (primary) hypertension: Secondary | ICD-10-CM | POA: Diagnosis not present

## 2019-07-27 DIAGNOSIS — E781 Pure hyperglyceridemia: Secondary | ICD-10-CM

## 2019-07-27 DIAGNOSIS — E1165 Type 2 diabetes mellitus with hyperglycemia: Secondary | ICD-10-CM | POA: Insufficient documentation

## 2019-07-27 DIAGNOSIS — G40909 Epilepsy, unspecified, not intractable, without status epilepticus: Secondary | ICD-10-CM | POA: Insufficient documentation

## 2019-07-27 DIAGNOSIS — R000 Tachycardia, unspecified: Secondary | ICD-10-CM

## 2019-07-27 DIAGNOSIS — R569 Unspecified convulsions: Secondary | ICD-10-CM

## 2019-07-27 LAB — COMPREHENSIVE METABOLIC PANEL
ALT: 22 U/L (ref 0–35)
AST: 47 U/L — ABNORMAL HIGH (ref 0–37)
Albumin: 4.3 g/dL (ref 3.5–5.2)
Alkaline Phosphatase: 91 U/L (ref 39–117)
BUN: 9 mg/dL (ref 6–23)
CO2: 26 mEq/L (ref 19–32)
Calcium: 9.3 mg/dL (ref 8.4–10.5)
Chloride: 98 mEq/L (ref 96–112)
Creatinine, Ser: 0.67 mg/dL (ref 0.40–1.20)
GFR: 121.71 mL/min (ref >60.00)
Glucose, Bld: 113 mg/dL — ABNORMAL HIGH (ref 70–99)
Potassium: 3.4 mEq/L — ABNORMAL LOW (ref 3.5–5.1)
Sodium: 134 mEq/L — ABNORMAL LOW (ref 135–145)
Total Bilirubin: 0.4 mg/dL (ref 0.2–1.2)
Total Protein: 8 g/dL (ref 6.0–8.3)

## 2019-07-27 LAB — LIPID PANEL
Cholesterol: 216 mg/dL — ABNORMAL HIGH (ref 0–200)
HDL: 102.7 mg/dL (ref >39.00)
LDL Cholesterol: 94 mg/dL (ref 0–99)
NonHDL: 113.09
Total CHOL/HDL Ratio: 2
Triglycerides: 97 mg/dL (ref 0.0–149.0)
VLDL: 19.4 mg/dL (ref 0.0–40.0)

## 2019-07-27 LAB — CBC
HCT: 30.4 % — ABNORMAL LOW (ref 36.0–46.0)
Hemoglobin: 10.1 g/dL — ABNORMAL LOW (ref 12.0–15.0)
MCHC: 33.4 g/dL (ref 30.0–36.0)
MCV: 105.3 fl — ABNORMAL HIGH (ref 78.0–100.0)
Platelets: 262 10*3/uL (ref 150.0–400.0)
RBC: 2.88 Mil/uL — ABNORMAL LOW (ref 3.87–5.11)
RDW: 19.2 % — ABNORMAL HIGH (ref 11.5–15.5)
WBC: 7.7 10*3/uL (ref 4.0–10.5)

## 2019-07-27 LAB — HEMOGLOBIN A1C: Hgb A1c MFr Bld: 5.1 % (ref 4.6–6.5)

## 2019-07-27 LAB — TSH: TSH: 1.48 u[IU]/mL (ref 0.35–4.50)

## 2019-07-27 NOTE — Assessment & Plan Note (Signed)
On two occasions with syncope preceding. Referral placed to neurology. Discussed that she should not be driving until evaluated.

## 2019-07-27 NOTE — Assessment & Plan Note (Signed)
Not on potassium supplementation. Consider spironolactone if needed for hypertension treatment, or resume potassium supplementation.   CMP pending.

## 2019-07-27 NOTE — Progress Notes (Signed)
Subjective:    Patient ID: Melissa Chapman, female    DOB: [birth year], 34 y.o.   MRN: [id number]  HPI  This visit occurred during the SARS-CoV-2 public health emergency.  Safety protocols were in place, including screening questions prior to the visit, additional usage of staff PPE, and extensive cleaning of exam room while observing appropriate contact time as indicated for disinfecting solutions.   Melissa Chapman is a 34 year old female with a history of hypertension, migraines, anxiety and depression, chronic hypokalemia who presents today with a chief complaint of syncope. She is also overdue for follow up.  1) Syncope: Three episodes of syncope over the last 18 months with the last one being March 31st. Episodes seem to be about 6 months apart. Prior to each episode she will rise from sitting, be walking, and then fall to the floor. During two out of three episodes bystanders/friends have witnessed "seizure" like activity. She did vomit once after one of her episodes. She denies loss of urine control or bite marks to the tongue.   2) Essential Hypertension: Currently managed on amlodipine 10 mg for which she takes daily for the most part. She endorses that's she's been taking this consistently even though we last prescribed a one year supply in July 2019.  BP Readings from Last 3 Encounters:  07/27/19 140/86  06/18/18 (!) 161/105  04/12/18 135/90   3) Anxiety and Depression: Currently managed bupropion 150 mg BID for which she is receiving from her GYN. Overall feels well managed.   4) Hypokalemia: Chronic, not currently managed on potassium supplements.   Review of Systems  Respiratory: Negative for shortness of breath.   Cardiovascular: Negative for chest pain and palpitations.  Neurological: Positive for syncope. Negative for dizziness and headaches.       See HPI       Past Medical History:  Diagnosis Date  . Anxiety and depression   . Chronic hypokalemia   . Essential  hypertension   . Pancreatitis      Social History   Socioeconomic History  . Marital status: Single    Spouse name: Not on file  . Number of children: Not on file  . Years of education: Not on file  . Highest education level: Not on file  Occupational History  . Not on file  Tobacco Use  . Smoking status: Never Smoker  . Smokeless tobacco: Never Used  Substance and Sexual Activity  . Alcohol use: Yes  . Drug use: No  . Sexual activity: Yes  Other Topics Concern  . Not on file  Social History Narrative   Single.    1 son.   Works as a Marine scientist in [employer].    Enjoys watching movies, swimming.   Social Determinants of Health   Financial Resource Strain:   . Difficulty of Paying Living Expenses:   Food Insecurity:   . Worried About Charity fundraiser in the Last Year:   . Arboriculturist in the Last Year:   Transportation Needs:   . Film/video editor (Medical):   Marland Kitchen Lack of Transportation (Non-Medical):   Physical Activity:   . Days of Exercise per Week:   . Minutes of Exercise per Session:   Stress:   . Feeling of Stress :   Social Connections:   . Frequency of Communication with Friends and Family:   . Frequency of Social Gatherings with Friends and Family:   . Attends Religious Services:   .  Active Member of Clubs or Organizations:   . Attends Banker Meetings:   Marland Kitchen Marital Status:   Intimate Partner Violence:   . Fear of Current or Ex-Partner:   . Emotionally Abused:   Marland Kitchen Physically Abused:   . Sexually Abused:     No past surgical history on file.  Family History  Problem Relation Age of Onset  . Depression Mother   . Hypertension Mother   . Hyperlipidemia Mother   . Hypertension Father   . Diabetes Father   . Hyperlipidemia Father   . Depression Sister   . Cancer Paternal Grandmother        Sinus     No Known Allergies  Current Outpatient Medications on File Prior to Visit  Medication Sig Dispense Refill  . acetaminophen  (TYLENOL) 325 MG tablet Take 650 mg by mouth every 6 (six) hours as needed.    Marland Kitchen amLODipine (NORVASC) 10 MG tablet Take 1 tablet by mouth once daily for blood pressure. 90 tablet 3  . buPROPion (ZYBAN) 150 MG 12 hr tablet TAKE 1 TABLET BY MOUTH TWICE A DAY 60 tablet 0  . naproxen (NAPROSYN) 500 MG tablet Take 500 mg by mouth 2 (two) times daily with a meal.     No current facility-administered medications on file prior to visit.    BP 140/86   Pulse (!) 129   Temp (!) 96.6 F (35.9 C) (Temporal)   Ht 5\' 6"  (1.676 m)   Wt 141 lb (64 kg)   SpO2 98%   BMI 22.76 kg/m    Objective:   Physical Exam  Constitutional: She appears well-nourished.  Cardiovascular: Regular rhythm.  Sinus tachycardia  Respiratory: Effort normal and breath sounds normal.  Musculoskeletal:     Cervical back: Neck supple.  Skin: Skin is warm and dry.  Psychiatric: She has a normal mood and affect.           Assessment & Plan:

## 2019-07-27 NOTE — Patient Instructions (Addendum)
Stop by the lab prior to leaving today. I will notify you of your results once received.   You will be contacted regarding your referral to neurology.  Please let us know if you have not been contacted within two weeks.   Please take a picture of your amlodipine bottle when you get home.  It was a pleasure to see you today!

## 2019-07-27 NOTE — Assessment & Plan Note (Signed)
Overall well controlled on bupropion per GYN. Continue same.

## 2019-07-27 NOTE — Assessment & Plan Note (Signed)
Unclear cause.  Checking labs today. Referral placed to neurology for "seizure" like activity occurring after syncope.   Consider cardiology evaluation.

## 2019-07-27 NOTE — Assessment & Plan Note (Signed)
Chronic with HR ranging 90's-low 100,s 129 today. She is a smoker. Checking labs today. Consider beta blocker therapy vs cardiology evaluation.

## 2019-07-27 NOTE — Assessment & Plan Note (Signed)
Repeat lipid panel pending. 

## 2019-07-27 NOTE — Assessment & Plan Note (Addendum)
Above goal in the office today, endorses compliance to Amlodipine. We have not prescribed this since July 2019 for a one year supply, she would have run out in July 2020.  We called [facility] who stated that she last picked this up in April 2020. She will double check once she gets home to ensure that she is in fact taking this and will also take note of the prescribing provider.   Consider spironolactone, ACE/ARB if needed.

## 2019-07-29 ENCOUNTER — Encounter: Payer: Self-pay | Admitting: Neurology

## 2019-07-30 ENCOUNTER — Other Ambulatory Visit: Payer: Self-pay | Admitting: Primary Care

## 2019-07-30 DIAGNOSIS — R569 Unspecified convulsions: Secondary | ICD-10-CM

## 2019-07-30 DIAGNOSIS — R55 Syncope and collapse: Secondary | ICD-10-CM

## 2019-07-31 ENCOUNTER — Telehealth: Payer: Self-pay | Admitting: Primary Care

## 2019-07-31 NOTE — Telephone Encounter (Signed)
Spoke with patient, she is requesting something be called in to the pharmacy today. She states that she has been dealing with a lot of agitation and irritation recently, worse over the last month, "off and on".  States that she feels its related to her Fathers passing last year about this time.   [facility]  Please advise, thanks.

## 2019-07-31 NOTE — Telephone Encounter (Signed)
Melissa Chapman

## 2019-07-31 NOTE — Telephone Encounter (Signed)
Johny Drilling, she doesn't know who this Dr. Allena Katz is that is on her medication bottle. Can we call [facility] and ask who is refilling her amlodipine? We know that her last refill by me was in April 2020.

## 2019-07-31 NOTE — Telephone Encounter (Signed)
Please notify patient that when we met last week she endorsed feeling okay on bupropion 150 mg twice daily that is provided to her by GYN.  I am happy to help her, but we need to get her medications straight. Have her bring in all of her current medication bottles. Something still isn't right with the Amlodipine. I have not prescribed this in quite some time.  I can meet with her virtually regarding her anxiety and depression.

## 2019-08-03 NOTE — Telephone Encounter (Signed)
Reply to My Chart message.

## 2019-08-04 ENCOUNTER — Encounter: Payer: Self-pay | Admitting: Diagnostic Neuroimaging

## 2019-08-04 ENCOUNTER — Other Ambulatory Visit: Payer: Self-pay

## 2019-08-04 ENCOUNTER — Ambulatory Visit (INDEPENDENT_AMBULATORY_CARE_PROVIDER_SITE_OTHER): Payer: 59 | Admitting: Diagnostic Neuroimaging

## 2019-08-04 VITALS — BP 144/90 | HR 106 | Temp 98.0°F | Ht 66.0 in | Wt 136.6 lb

## 2019-08-04 DIAGNOSIS — G40909 Epilepsy, unspecified, not intractable, without status epilepticus: Secondary | ICD-10-CM | POA: Diagnosis not present

## 2019-08-04 MED ORDER — LAMOTRIGINE 25 MG PO TABS: ORAL_TABLET | ORAL | 3 refills | Status: DC

## 2019-08-04 NOTE — Patient Instructions (Addendum)
LOC x 3 (04/12/18, 06/28/19 x 2) ? seizure vs syncope  - check MRI brain, EEG  - start lamotrigine for seizure prevention  - bupropion can reduce seizure threshold; consider to reduce or change to another medication (such as an SSRI); patient will discuss with PCP  - According to [place] law, you can not drive unless you are seizure / syncope free for at least 6 months and under physician's care.   - Please maintain precautions. Do not participate in activities where a loss of awareness could harm you or someone else. No swimming alone, no tub bathing, no hot tubs, no driving, no operating motorized vehicles (cars, ATVs, motocycles, etc), lawnmowers, power tools or firearms. No standing at heights, such as rooftops, ladders or stairs. Avoid hot objects such as stoves, heaters, open fires. Wear a helmet when riding a bicycle, scooter, skateboard, etc. and avoid areas of traffic. Set your water heater to 120 degrees or less.

## 2019-08-04 NOTE — Progress Notes (Signed)
[facility]  PATIENT: Melissa Chapman DOB: Jun 09, 1985  REFERRING CLINICIAN: Doreene Nest, NP HISTORY FROM: patient  REASON FOR VISIT: new consult    HISTORICAL  CHIEF COMPLAINT:  Chief Complaint  Patient presents with  . Syncope    rm 7 New Pt "three episodes 6 months apart, passed out each time w/vomiting- no incontinence;  no memory of events, went to ED x 2, no EEG"    HISTORY OF PRESENT ILLNESS:   34 year old female here for evaluation of seizure vs syncope.  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   04/12/2018 patient was at home, woke up in the emergency room.  Apparently she had passed out.  She was diagnosed with dehydration and syncope.  06/28/2019 patient had just arrived in [place] with her son, was at the hotel when she collapsed and had seizure-like activity.  Apparently she woke up and was able to talk to the staff but then had a second event.  Patient does not remember waking up in between the 2 events.  She was taken to local hospital and had MRI and EEG.  She does not have these results.  She does not know what the conclusion of her work-up was.  Patient was able to come back to the [place] safely.  Patient denies any excessive alcohol use recently.  She states she drinks about 3 drinks per week.  She did not have any alcohol on 06/28/2019.  Patient does endorse chronic sleep deprivation and irregular sleep pattern.  She has history of anxiety.  She has been on bupropion for 2 years.  This was increased in dosage in 2020/06/04after her father passed away.  She was previously on Zoloft.  Patient has had issues with chronic nausea, hypokalemia, abdominal pain in the past.  She also had pancreatitis x2 in 2017 2018, possibly related to excessive alcohol use at that time.  No family history of seizure.  Patient lives at home with her 43-year-old son.  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  She is not currently working.    REVIEW OF SYSTEMS: Full 14 system review of systems performed and negative with exception of: as per HPI.  ALLERGIES: No Known Allergies  HOME MEDICATIONS: Outpatient Medications Prior to Visit  Medication Sig Dispense Refill  . acetaminophen (TYLENOL) 325 MG tablet Take 650 mg by mouth every 6 (six) hours as needed.    Marland Kitchen amLODipine (NORVASC) 10 MG tablet Take 1 tablet by mouth once daily for blood pressure. 90 tablet 3  . buPROPion (ZYBAN) 150 MG 12 hr tablet TAKE 1 TABLET BY MOUTH TWICE A DAY 60 tablet 0  . diclofenac (VOLTAREN) 50 MG EC tablet Take 100 mg by mouth 2 (two) times daily as needed.    . naproxen (NAPROSYN) 500 MG tablet Take 500 mg by mouth 2 (two) times daily with a meal.     No facility-administered medications prior to visit.    PAST MEDICAL HISTORY: Past Medical History:  Diagnosis Date  . Anxiety and depression   . Chronic hypokalemia   . Essential hypertension   . Pancreatitis   . Syncope    most recent March 2021    PAST SURGICAL HISTORY: No past surgical history on file.  FAMILY HISTORY: Family History  Problem Relation Age of Onset  . Depression Mother   . Hypertension Mother   . Hyperlipidemia  Mother   . Hypertension Father   . Diabetes Father   . Hyperlipidemia Father   . Depression Sister   . Cancer Paternal Grandmother        Sinus     SOCIAL HISTORY: Social History   Socioeconomic History  . Marital status: Single    Spouse name: Not on file  . Number of children: 1  . Years of education: Not on file  . Highest education level: Bachelor's degree (e.g., BA, AB, BS)  Occupational History    Comment: RN   Tobacco Use  . Smoking status: Current Every Day Smoker    Packs/day: 0.25  . Smokeless tobacco: Never Used  Substance and Sexual Activity  . Alcohol use: Yes    Comment: socially  . Drug use: No  . Sexual activity: Yes  Other Topics  Concern  . Not on file  Social History Narrative   Single.    1 son.   Works as a Marine scientist in [employer].    Enjoys watching movies, swimming.   Social Determinants of Health   Financial Resource Strain:   . Difficulty of Paying Living Expenses:   Food Insecurity:   . Worried About Charity fundraiser in the Last Year:   . Arboriculturist in the Last Year:   Transportation Needs:   . Film/video editor (Medical):   Marland Kitchen Lack of Transportation (Non-Medical):   Physical Activity:   . Days of Exercise per Week:   . Minutes of Exercise per Session:   Stress:   . Feeling of Stress :   Social Connections:   . Frequency of Communication with Friends and Family:   . Frequency of Social Gatherings with Friends and Family:   . Attends Religious Services:   . Active Member of Clubs or Organizations:   . Attends Archivist Meetings:   Marland Kitchen Marital Status:   Intimate Partner Violence:   . Fear of Current or Ex-Partner:   . Emotionally Abused:   Marland Kitchen Physically Abused:   . Sexually Abused:      PHYSICAL EXAM  GENERAL EXAM/CONSTITUTIONAL: Vitals:  Vitals:   08/04/19 0759  BP: (!) 144/90  Pulse: (!) 106  Temp: 98 F (36.7 C)  Weight: 136 lb 9.6 oz (62 kg)  Height: 5\' 6"  (1.676 m)     Body mass index is 22.05 kg/m. Wt Readings from Last 3 Encounters:  08/04/19 136 lb 9.6 oz (62 kg)  07/27/19 141 lb (64 kg)  06/18/18 130 lb (59 kg)     Patient is in no distress; well developed, nourished and groomed; neck is supple  CARDIOVASCULAR:  Examination of carotid arteries is normal; no carotid bruits  TACHYCARDIA; no murmurs  Examination of peripheral vascular system by observation and palpation is normal  EYES:  Ophthalmoscopic exam of optic discs and posterior segments is normal; no papilledema or hemorrhages  No exam data present  MUSCULOSKELETAL:  Gait, strength, tone, movements noted in Neurologic exam below  NEUROLOGIC: MENTAL STATUS:  No flowsheet data  found.  awake, alert, oriented to person, place and time  recent and remote memory intact  normal attention and concentration  language fluent, comprehension intact, naming intact  fund of knowledge appropriate  CRANIAL NERVE:   2nd - no papilledema on fundoscopic exam  2nd, 3rd, 4th, 6th - pupils equal and reactive to light, visual fields full to confrontation, extraocular muscles intact, no nystagmus  5th - facial sensation symmetric  7th - facial  strength symmetric  8th - hearing intact  9th - palate elevates symmetrically, uvula midline  11th - shoulder shrug symmetric  12th - tongue protrusion midline  MOTOR:   POSTURAL AND ACTION TREMOR IN BUE  normal bulk and tone, full strength in the BUE, BLE  SENSORY:   normal and symmetric to light touch, temperature, vibration  COORDINATION:   finger-nose-finger, fine finger movements normal  REFLEXES:   deep tendon reflexes SLIGHTLY BRISK and symmetric  GAIT/STATION:   narrow based gait     DIAGNOSTIC DATA (LABS, IMAGING, TESTING) - I reviewed patient records, labs, notes, testing and imaging myself where available.  Lab Results  Component Value Date   WBC 7.7 07/27/2019   HGB 10.1 (L) 07/27/2019   HCT 30.4 (L) 07/27/2019   MCV 105.3 (H) 07/27/2019   PLT 262.0 07/27/2019      Component Value Date/Time   NA 134 (L) 07/27/2019 1203   NA 138 12/13/2013 0445   K 3.4 (L) 07/27/2019 1203   K 3.2 (L) 12/13/2013 0445   CL 98 07/27/2019 1203   CL 105 12/13/2013 0445   CO2 26 07/27/2019 1203   CO2 26 12/13/2013 0445   GLUCOSE 113 (H) 07/27/2019 1203   GLUCOSE 75 12/13/2013 0445   BUN 9 07/27/2019 1203   BUN 4 (L) 12/13/2013 0445   CREATININE 0.67 07/27/2019 1203   CREATININE 0.84 12/13/2013 0445   CALCIUM 9.3 07/27/2019 1203   CALCIUM 7.3 (L) 12/13/2013 0445   PROT 8.0 07/27/2019 1203   PROT 7.6 12/11/2013 2310   ALBUMIN 4.3 07/27/2019 1203   ALBUMIN 3.8 12/11/2013 2310   AST 47 (H) 07/27/2019  1203   AST 40 (H) 12/11/2013 2310   ALT 22 07/27/2019 1203   ALT 23 12/11/2013 2310   ALKPHOS 91 07/27/2019 1203   ALKPHOS 65 12/11/2013 2310   BILITOT 0.4 07/27/2019 1203   BILITOT 0.6 12/11/2013 2310   GFRNONAA >60 06/18/2018 1858   GFRNONAA >60 12/13/2013 0445   GFRAA >60 06/18/2018 1858   GFRAA >60 12/13/2013 0445   Lab Results  Component Value Date   CHOL 216 (H) 07/27/2019   HDL 102.70 07/27/2019   LDLCALC 94 07/27/2019   LDLDIRECT 44.0 09/02/2017   TRIG 97.0 07/27/2019   CHOLHDL 2 07/27/2019   Lab Results  Component Value Date   HGBA1C 5.1 07/27/2019   No results found for: [id number] Lab Results  Component Value Date   TSH 1.48 07/27/2019        ASSESSMENT AND PLAN  34 y.o. year old female here with:  Dx:  1. Seizure disorder (HCC)      PLAN:  Loss of consciousness events x 3 (04/12/18, 06/28/19 x 2) ? seizure vs syncope  - check MRI brain, EEG  - start lamotrigine for seizure prevention (can also help with mood stabilization)  - bupropion can reduce seizure threshold; consider to reduce or change to another medication (such as an SSRI); patient will discuss with PCP  - caution with alcohol use and sleep deprivation  - According to [place] law, you can not drive unless you are seizure / syncope free for at least 6 months and under physician's care.   - Please maintain precautions. Do not participate in activities where a loss of awareness could harm you or someone else. No swimming alone, no tub bathing, no hot tubs, no driving, no operating motorized vehicles (cars, ATVs, motocycles, etc), lawnmowers, power tools or firearms. No standing at heights, such as rooftops,  ladders or stairs. Avoid hot objects such as stoves, heaters, open fires. Wear a helmet when riding a bicycle, scooter, skateboard, etc. and avoid areas of traffic. Set your water heater to 120 degrees or less.   Orders Placed This Encounter  Procedures  . MR BRAIN W WO CONTRAST  . EEG  adult   Meds ordered this encounter  Medications  . lamoTRIgine (LAMICTAL) 25 MG tablet    Sig: Take 25mg  daily for 2 weeks; then take 25mg  BID for 2 weeks; then take 50mg  BID for 2 weeks; then take 75mg  BID for 2 weeks; then 100mg  BID    Dispense:  240 tablet    Refill:  3   Return in about 6 months (around 02/04/2020).  I reviewed images, labs, notes, records myself. I summarized findings and reviewed with patient, for this high risk condition (seizure) requiring high complexity decision making.    , MD 08/04/2019, 8:50 AM Certified in Neurology, Neurophysiology and Neuroimaging  [facility] [address] [place], Suanne Marker 10/04/2019 [phone]

## 2019-08-10 ENCOUNTER — Telehealth: Payer: Self-pay | Admitting: Diagnostic Neuroimaging

## 2019-08-10 NOTE — Telephone Encounter (Signed)
spoke to the patient due to the cost she states she will call me back  [employer] auth: [id number] (exp. 08/05/19 to 11/03/19)

## 2019-08-26 NOTE — Telephone Encounter (Signed)
Pt called to schedule her MRI apt

## 2019-08-27 ENCOUNTER — Telehealth: Payer: Self-pay | Admitting: *Deleted

## 2019-08-27 NOTE — Telephone Encounter (Signed)
Received my chart: I wanted to ask/inform you that I'm at 50mg  lamictal. I slowed myself because I feel i am having increased irritation/agitation with the smallest things.  If you could advise me, I and I think everyone around me would definitely appreciate it!  Also I've been awaiting MRI as I believe we have been playing "phone tag".   Called patient, LVM requesting call back or my chart message to give exact dosing of lamictal she is taking, also advised her to call to schedule MRI.

## 2019-09-01 NOTE — Telephone Encounter (Signed)
I left a voicemail for the patient to call back about scheduling mri.

## 2019-09-07 ENCOUNTER — Ambulatory Visit (INDEPENDENT_AMBULATORY_CARE_PROVIDER_SITE_OTHER): Payer: 59 | Admitting: Diagnostic Neuroimaging

## 2019-09-07 ENCOUNTER — Other Ambulatory Visit: Payer: Self-pay

## 2019-09-07 DIAGNOSIS — G40909 Epilepsy, unspecified, not intractable, without status epilepticus: Secondary | ICD-10-CM | POA: Diagnosis not present

## 2019-09-22 ENCOUNTER — Telehealth: Payer: Self-pay | Admitting: *Deleted

## 2019-09-25 NOTE — Procedures (Signed)
   [facility]  EEG (ELECTROENCEPHALOGRAM) REPORT   STUDY DATE: 09/07/19 PATIENT NAME: Melissa Chapman DOB: [birth year] MRN: [id number]  ORDERING CLINICIAN: Joycelyn Schmid, MD   TECHNOLOGIST: Charlett Blake TECHNIQUE: Electroencephalogram was recorded utilizing standard 10-20 system of lead placement and reformatted into average and bipolar montages.  RECORDING TIME: 27 minutes ACTIVATION: hyperventilation and photic stimulation  CLINICAL INFORMATION: 34 year old female with loss of consciousness.  FINDINGS: Posterior dominant background rhythms, which attenuate with eye opening, ranging 8-9 hertz and 70-80 microvolts. No focal, lateralizing, epileptiform activity or seizures are seen. Patient recorded in the awake and drowsy state. EKG channel shows regular rhythm of 75-80 beats per minute.   IMPRESSION:   Normal EEG in the awake and drowsy states.    INTERPRETING PHYSICIAN:  Suanne Marker, MD Certified in Neurology, Neurophysiology and Neuroimaging  [facility] [address], [address] [place], [place] [address] [phone]

## 2019-09-28 NOTE — Telephone Encounter (Signed)
Normal. -VRP

## 2019-10-21 DIAGNOSIS — I10 Essential (primary) hypertension: Secondary | ICD-10-CM

## 2019-10-22 MED ORDER — AMLODIPINE BESYLATE 10 MG PO TABS: ORAL_TABLET | ORAL | 0 refills | Status: DC

## 2019-10-22 NOTE — Telephone Encounter (Signed)
Last prescribed on 10/11/2017 #90 with 3 refills Last OV (follow up/acute ) with Mayra Reel on  07/27/2019 No future OV scheduled

## 2019-11-03 ENCOUNTER — Ambulatory Visit: Payer: 59 | Admitting: Neurology

## 2019-11-30 ENCOUNTER — Other Ambulatory Visit: Payer: Self-pay

## 2019-11-30 ENCOUNTER — Ambulatory Visit (LOCAL_COMMUNITY_HEALTH_CENTER): Payer: 59

## 2019-11-30 DIAGNOSIS — Z111 Encounter for screening for respiratory tuberculosis: Secondary | ICD-10-CM | POA: Diagnosis not present

## 2019-11-30 DIAGNOSIS — Z23 Encounter for immunization: Secondary | ICD-10-CM

## 2019-11-30 DIAGNOSIS — Z0184 Encounter for antibody response examination: Secondary | ICD-10-CM

## 2019-11-30 NOTE — Progress Notes (Signed)
Client counseled usually take 24 - 48 hours for MMR results to be available. ROI signed in the event needs to retrieve hard copy from agency. Per client, has My Chart. Rich Number, RN

## 2019-12-01 ENCOUNTER — Telehealth: Payer: Self-pay

## 2019-12-01 LAB — MEASLES/MUMPS/RUBELLA IMMUNITY
MUMPS ABS, IGG: 84 AU/mL (ref >10.9)
RUBEOLA AB, IGG: 61.1 AU/mL (ref >16.4)
Rubella Antibodies, IGG: 3.35 index (ref >0.99)

## 2019-12-01 NOTE — Telephone Encounter (Signed)
-----   Message from William Hamburger, RN sent at 12/01/2019  8:53 AM EDT -----  ----- Message ----- From: Interface, [facility] Lab Results In Sent: 12/01/2019   7:43 AM EDT To: Achd-Results

## 2019-12-01 NOTE — Telephone Encounter (Signed)
Phone call to pt regarding titer results. Left message on voicemail that RN with [facility] is calling about titer results. Titer results indicate immunity for MMR and booster vaccine of MMR not indicated by titer. ROI already signed for release of records (see clinic notes), can come by [facility] and pick up copy at front reception desk if desired.

## 2019-12-01 NOTE — Telephone Encounter (Signed)
Additional message left on voicemail to indicate she can view results on My Chart as well.

## 2019-12-03 ENCOUNTER — Other Ambulatory Visit: Payer: Self-pay

## 2019-12-03 ENCOUNTER — Ambulatory Visit (LOCAL_COMMUNITY_HEALTH_CENTER): Payer: 59

## 2019-12-03 DIAGNOSIS — Z111 Encounter for screening for respiratory tuberculosis: Secondary | ICD-10-CM

## 2019-12-03 LAB — TB SKIN TEST
Induration: 0 mm
TB Skin Test: NEGATIVE

## 2020-02-09 ENCOUNTER — Encounter: Payer: Self-pay | Admitting: Diagnostic Neuroimaging

## 2020-02-09 ENCOUNTER — Ambulatory Visit: Payer: 59 | Admitting: Diagnostic Neuroimaging

## 2020-02-09 ENCOUNTER — Telehealth: Payer: Self-pay | Admitting: *Deleted

## 2020-02-09 NOTE — Telephone Encounter (Signed)
Patient was no show for follow up today. 

## 2020-02-28 ENCOUNTER — Other Ambulatory Visit: Payer: Self-pay

## 2020-02-28 ENCOUNTER — Emergency Department (HOSPITAL_COMMUNITY)
Admission: EM | Admit: 2020-02-28 | Discharge: 2020-02-29 | Disposition: A | Discharge status: Home / Self Care | Payer: 59 | Attending: Emergency Medicine | Admitting: Emergency Medicine

## 2020-02-28 DIAGNOSIS — Z9119 Patient's noncompliance with other medical treatment and regimen: Secondary | ICD-10-CM | POA: Diagnosis not present

## 2020-02-28 DIAGNOSIS — D649 Anemia, unspecified: Secondary | ICD-10-CM

## 2020-02-28 DIAGNOSIS — Z79899 Other long term (current) drug therapy: Secondary | ICD-10-CM | POA: Insufficient documentation

## 2020-02-28 DIAGNOSIS — I10 Essential (primary) hypertension: Secondary | ICD-10-CM | POA: Diagnosis not present

## 2020-02-28 DIAGNOSIS — F17200 Nicotine dependence, unspecified, uncomplicated: Secondary | ICD-10-CM | POA: Diagnosis not present

## 2020-02-28 DIAGNOSIS — R569 Unspecified convulsions: Secondary | ICD-10-CM | POA: Diagnosis not present

## 2020-02-28 DIAGNOSIS — Z9114 Patient's other noncompliance with medication regimen: Secondary | ICD-10-CM

## 2020-02-28 LAB — CBC WITH DIFFERENTIAL/PLATELET
Abs Immature Granulocytes: 0.04 10*3/uL (ref 0.00–0.07)
Basophils Absolute: 0 10*3/uL (ref 0.0–0.1)
Basophils Relative: 0 %
Eosinophils Absolute: 0 10*3/uL (ref 0.0–0.5)
Eosinophils Relative: 0 %
HCT: 24.9 % — ABNORMAL LOW (ref 36.0–46.0)
Hemoglobin: 8.5 g/dL — ABNORMAL LOW (ref 12.0–15.0)
Immature Granulocytes: 1 %
Lymphocytes Relative: 15 %
Lymphs Abs: 1 10*3/uL (ref 0.7–4.0)
MCH: 37.4 pg — ABNORMAL HIGH (ref 26.0–34.0)
MCHC: 34.1 g/dL (ref 30.0–36.0)
MCV: 109.7 fL — ABNORMAL HIGH (ref 80.0–100.0)
Monocytes Absolute: 0.5 10*3/uL (ref 0.1–1.0)
Monocytes Relative: 7 %
Neutro Abs: 5.4 10*3/uL (ref 1.7–7.7)
Neutrophils Relative %: 77 %
Platelets: 133 10*3/uL — ABNORMAL LOW (ref 150–400)
RBC: 2.27 MIL/uL — ABNORMAL LOW (ref 3.87–5.11)
RDW: 18.2 % — ABNORMAL HIGH (ref 11.5–15.5)
WBC: 7 10*3/uL (ref 4.0–10.5)
nRBC: 0 % (ref 0.0–0.2)

## 2020-02-28 LAB — COMPREHENSIVE METABOLIC PANEL
ALT: 19 U/L (ref 0–44)
AST: 94 U/L — ABNORMAL HIGH (ref 15–41)
Albumin: 3.8 g/dL (ref 3.5–5.0)
Alkaline Phosphatase: 139 U/L — ABNORMAL HIGH (ref 38–126)
Anion gap: 15 (ref 5–15)
BUN: <5 mg/dL — ABNORMAL LOW (ref 6–20)
CO2: 20 mmol/L — ABNORMAL LOW (ref 22–32)
Calcium: 9.2 mg/dL (ref 8.9–10.3)
Chloride: 102 mmol/L (ref 98–111)
Creatinine, Ser: 0.72 mg/dL (ref 0.44–1.00)
GFR, Estimated: >60 mL/min (ref >60)
Glucose, Bld: 135 mg/dL — ABNORMAL HIGH (ref 70–99)
Potassium: 3.9 mmol/L (ref 3.5–5.1)
Sodium: 137 mmol/L (ref 135–145)
Total Bilirubin: 1.9 mg/dL — ABNORMAL HIGH (ref 0.3–1.2)
Total Protein: 7.7 g/dL (ref 6.5–8.1)

## 2020-02-28 LAB — URINALYSIS, ROUTINE W REFLEX MICROSCOPIC
Bilirubin Urine: NEGATIVE
Glucose, UA: NEGATIVE mg/dL
Hgb urine dipstick: NEGATIVE
Ketones, ur: NEGATIVE mg/dL
Leukocytes,Ua: NEGATIVE
Nitrite: NEGATIVE
Protein, ur: 100 mg/dL — AB
Specific Gravity, Urine: 1.015 (ref 1.005–1.030)
pH: 9 — ABNORMAL HIGH (ref 5.0–8.0)

## 2020-02-28 LAB — I-STAT BETA HCG BLOOD, ED (MC, WL, AP ONLY): I-stat hCG, quantitative: <5 m[IU]/mL (ref <5)

## 2020-02-28 MED ORDER — LACTATED RINGERS IV BOLUS
1000.0000 mL | Freq: Once | INTRAVENOUS | Status: AC
  Administered 2020-02-28: 1000 mL via INTRAVENOUS

## 2020-02-28 MED ORDER — ONDANSETRON HCL 4 MG/2ML IJ SOLN
4.0000 mg | INTRAMUSCULAR | Status: AC
  Administered 2020-02-28: 4 mg via INTRAVENOUS
  Filled 2020-02-28: qty 2

## 2020-02-28 MED ORDER — FERROUS SULFATE 325 (65 FE) MG PO TABS: 325.0000 mg | ORAL_TABLET | Freq: Every day | ORAL | 0 refills | Status: DC

## 2020-02-28 MED ORDER — LORAZEPAM 1 MG PO TABS
1.0000 mg | ORAL_TABLET | Freq: Once | ORAL | Status: AC
  Administered 2020-02-28: 1 mg via ORAL
  Filled 2020-02-28: qty 1

## 2020-02-28 MED ORDER — LEVETIRACETAM 500 MG PO TABS: 500.0000 mg | ORAL_TABLET | Freq: Two times a day (BID) | ORAL | 0 refills | Status: DC

## 2020-02-28 MED ORDER — LEVETIRACETAM IN NACL 1000 MG/100ML IV SOLN
1000.0000 mg | Freq: Once | INTRAVENOUS | Status: AC
  Administered 2020-02-28: 1000 mg via INTRAVENOUS
  Filled 2020-02-28: qty 100

## 2020-02-28 MED ORDER — LAMOTRIGINE 25 MG PO TABS: ORAL_TABLET | ORAL | 0 refills | Status: DC

## 2020-02-28 NOTE — Discharge Instructions (Addendum)
Per [place] DMV statutes, patients with seizures are not allowed to drive until they have been seizure-free for six months and cleared by a physician    Use caution when using heavy equipment or power tools. Avoid working on ladders or at heights. Take showers instead of baths. Ensure the water temperature is not too high on the home water heater. Do not go swimming alone. Do not lock yourself in a room alone (i.e. bathroom). When caring for infants or small children, sit down when holding, feeding, or changing them to minimize risk of injury to the child in the event you have a seizure. Maintain good sleep hygiene. Avoid alcohol 

## 2020-02-28 NOTE — ED Notes (Signed)
Pt given ginger ale and saltines for PO challenge

## 2020-02-28 NOTE — ED Notes (Signed)
Pt alert and oriented ambulated to bathroom independently

## 2020-02-28 NOTE — ED Notes (Signed)
Pt OOb to bathroom. Even and steady gait.

## 2020-02-28 NOTE — ED Provider Notes (Signed)
[facility] EMERGENCY DEPARTMENT Provider Note   CSN: [id number] Arrival date & time: 02/28/20  2105     History Chief Complaint  Patient presents with  . Seizures    Melissa Chapman is a 34 y.o. female.  34 year old female with past medical history below including seizure, hypertension, migraine, anxiety/depression, pancreatitis who presents with seizure.  Patient had her first seizure at the beginning of this year and was seen 1 time in the [facility] neurology clinic at which time she was started on medication.  She did not follow-up and has been off of medication since August.  This evening, she went to the bathroom and then was found on the toilet by her significant other having seizure activity.  On EMS arrival, they found her to be postictal.  No injuries during the episode.  Patient states that she has had some nausea and vomiting as well as diarrhea today.  She denies any fevers or cough/cold symptoms.  No urinary symptoms.  She reports she drinks alcohol approximately 4 times per week, usually 2-3 beers at a time.  She denies any alcohol withdrawal symptoms when she does not drink.  She denies any sleep deprivation. No sick contacts.  The history is provided by the patient.  Seizures      Past Medical History:  Diagnosis Date  . Anxiety and depression   . Chronic hypokalemia   . Essential hypertension   . Pancreatitis   . Syncope    most recent March 2021    Patient Active Problem List   Diagnosis Date Noted  . Syncope 07/27/2019  . Prediabetes 07/27/2019  . Witnessed seizure-like activity (HCC) 07/27/2019  . Tachycardia 07/27/2019  . Chronic hypokalemia 10/11/2017  . Preventative health care 10/11/2017  . Hypertriglyceridemia 10/11/2017  . Elevated bilirubin 11/21/2016  . Migraine without status migrainosus, not intractable 11/15/2016  . Essential hypertension 10/22/2016  . Anxiety and depression 10/22/2016    No past surgical history on  file.   OB History   No obstetric history on file.     Family History  Problem Relation Age of Onset  . Depression Mother   . Hypertension Mother   . Hyperlipidemia Mother   . Hypertension Father   . Diabetes Father   . Hyperlipidemia Father   . Depression Sister   . Cancer Paternal Grandmother        Sinus     Social History   Tobacco Use  . Smoking status: Current Every Day Smoker    Packs/day: 0.25  . Smokeless tobacco: Never Used  Substance Use Topics  . Alcohol use: Yes    Comment: socially  . Drug use: No    Home Medications Prior to Admission medications   Medication Sig Start Date End Date Taking? Authorizing Provider  acetaminophen (TYLENOL) 325 MG tablet Take 650 mg by mouth every 6 (six) hours as needed.    [provider]  amLODipine (NORVASC) 10 MG tablet Take 1 tablet by mouth once daily for blood pressure. 10/22/19   Doreene Nest, NP  buPROPion (ZYBAN) 150 MG 12 hr tablet TAKE 1 TABLET BY MOUTH TWICE A DAY 08/26/18   Doreene Nest, NP  diclofenac (VOLTAREN) 50 MG EC tablet Take 100 mg by mouth 2 (two) times daily as needed. 08/02/19   [provider]  lamoTRIgine (LAMICTAL) 25 MG tablet Take 25mg  daily for 2 weeks; then take 25mg  BID for 2 weeks; then take 50mg  BID for 2 weeks; then take  75mg  BID for 2 weeks; then 100mg  BID 02/28/20   Ciji Boston, , MD  levETIRAcetam (KEPPRA) 500 MG tablet Take 1 tablet (500 mg total) by mouth 2 (two) times daily. 02/28/20   Khaidyn Staebell, Ambrose Finland, MD    Allergies    Patient has no known allergies.  Review of Systems   Review of Systems  Neurological: Positive for seizures.   All other systems reviewed and are negative except that which was mentioned in HPI  Physical Exam Updated Vital Signs BP (!) 138/99   Pulse (!) 112   Temp 99 F (37.2 C) (Oral)   Resp 19   SpO2 100%   Physical Exam Vitals and nursing note reviewed.  Constitutional:      General: She is not in acute  distress.    Appearance: She is well-developed.     Comments: Awake, alert  HENT:     Head: Normocephalic and atraumatic.  Eyes:     Extraocular Movements: Extraocular movements intact.     Conjunctiva/sclera: Conjunctivae normal.     Pupils: Pupils are equal, round, and reactive to light.  Cardiovascular:     Rate and Rhythm: Normal rate and regular rhythm.     Heart sounds: Normal heart sounds. No murmur heard.   Pulmonary:     Effort: Pulmonary effort is normal. No respiratory distress.     Breath sounds: Normal breath sounds.  Abdominal:     General: Bowel sounds are normal. There is no distension.     Palpations: Abdomen is soft.     Tenderness: There is no abdominal tenderness.  Musculoskeletal:     Cervical back: Neck supple.  Skin:    General: Skin is warm and dry.  Neurological:     Mental Status: She is alert and oriented to person, place, and time.     Cranial Nerves: No cranial nerve deficit.     Motor: No abnormal muscle tone.     Deep Tendon Reflexes: Reflexes are normal and symmetric.     Comments: Fluent speech, normal finger-to-nose testing, negative pronator drift, no clonus 5/5 strength and normal sensation x all 4 extremities Resting tremulousness Normal gait  Psychiatric:        Mood and Affect: Mood normal.        Thought Content: Thought content normal.        Judgment: Judgment normal.     ED Results / Procedures / Treatments   Labs (all labs ordered are listed, but only abnormal results are displayed) Labs Reviewed  COMPREHENSIVE METABOLIC PANEL  URINALYSIS, ROUTINE W REFLEX MICROSCOPIC  RAPID URINE DRUG SCREEN, HOSP PERFORMED  CBC WITH DIFFERENTIAL/PLATELET  I-STAT BETA HCG BLOOD, ED ([facility], [facility], [facility] ONLY)    EKG None  Radiology No results found.  Procedures Procedures (including critical care time)  Medications Ordered in ED Medications  lactated ringers bolus 1,000 mL (1,000 mLs Intravenous New Bag/Given 02/28/20 2206)    levETIRAcetam (KEPPRA) IVPB 1000 mg/100 mL premix (1,000 mg Intravenous New Bag/Given 02/28/20 2206)  ondansetron (ZOFRAN) injection 4 mg (4 mg Intravenous Given 02/28/20 2201)    ED Course  I have reviewed the triage vital signs and the nursing notes.  Pertinent labs & imaging results that were available during my care of the patient were reviewed by me and considered in my medical decision making (see chart for details).    MDM Rules/Calculators/A&P  Awake, alert, neuro intact on exam. I suspect seizure is 2/2 medication non-compliance in setting of GI symptoms. Will load w/ keppra and give keppra bridge until she is able to titrate up her dose of lamictal at home. I have ordered screening labs, IVF and zofran for her N/V symptoms. If patient is improved and remains neuro intact, I feel she will be able to go home w/ neurology clinic f/u. Pt signed out pending completion of labs and reassessment. Final Clinical Impression(s) / ED Diagnoses Final diagnoses:  Seizure (HCC)  Non compliance w medication regimen    Rx / DC Orders ED Discharge Orders         Ordered    lamoTRIgine (LAMICTAL) 25 MG tablet        02/28/20 2237    levETIRAcetam (KEPPRA) 500 MG tablet  2 times daily        02/28/20 2237           Nimah Uphoff, Ambrose Finland, MD 02/28/20 2239

## 2020-02-28 NOTE — ED Triage Notes (Signed)
Pt BIB [facility] from home following seizure. Unknown time. Found on toilet by significant other. Called EMS on arrival found postictal. No injuries. C/o nausea.   Per note first seizure March of 2021 instructed to follow up with neurology for medication management. Pt did not follow up and has not had any repeat seizures till today.

## 2020-02-29 LAB — RAPID URINE DRUG SCREEN, HOSP PERFORMED
Amphetamines: NOT DETECTED
Barbiturates: NOT DETECTED
Benzodiazepines: NOT DETECTED
Cocaine: NOT DETECTED
Opiates: NOT DETECTED
Tetrahydrocannabinol: NOT DETECTED

## 2020-02-29 NOTE — ED Provider Notes (Signed)
Care assumed from Dr. Clarene Duke, patient with apparently recurrent seizure secondary to medication noncompliance pending urinalysis.  Urinalysis shows no evidence of UTI.  She is discharged with prescriptions for levetiracetam and lamotrigine.  She is to follow-up with her neurologist.  Results for orders placed or performed during the hospital encounter of 02/28/20  Comprehensive metabolic panel  Result Value Ref Range   Sodium 137 135 - 145 mmol/L   Potassium 3.9 3.5 - 5.1 mmol/L   Chloride 102 98 - 111 mmol/L   CO2 20 (L) 22 - 32 mmol/L   Glucose, Bld 135 (H) 70 - 99 mg/dL   BUN <5 (L) 6 - 20 mg/dL   Creatinine, Ser 5.18 0.44 - 1.00 mg/dL   Calcium 9.2 8.9 - 84.1 mg/dL   Total Protein 7.7 6.5 - 8.1 g/dL   Albumin 3.8 3.5 - 5.0 g/dL   AST 94 (H) 15 - 41 U/L   ALT 19 0 - 44 U/L   Alkaline Phosphatase 139 (H) 38 - 126 U/L   Total Bilirubin 1.9 (H) 0.3 - 1.2 mg/dL   GFR, Estimated >66 >06 mL/min   Anion gap 15 5 - 15  Urinalysis, Routine w reflex microscopic Urine, Clean Catch  Result Value Ref Range   Color, Urine YELLOW YELLOW   APPearance HAZY (A) CLEAR   Specific Gravity, Urine 1.015 1.005 - 1.030   pH 9.0 (H) 5.0 - 8.0   Glucose, UA NEGATIVE NEGATIVE mg/dL   Hgb urine dipstick NEGATIVE NEGATIVE   Bilirubin Urine NEGATIVE NEGATIVE   Ketones, ur NEGATIVE NEGATIVE mg/dL   Protein, ur 301 (A) NEGATIVE mg/dL   Nitrite NEGATIVE NEGATIVE   Leukocytes,Ua NEGATIVE NEGATIVE   RBC / HPF 0-5 0 - 5 RBC/hpf   WBC, UA 0-5 0 - 5 WBC/hpf   Bacteria, UA RARE (A) NONE SEEN   Squamous Epithelial / LPF 11-20 0 - 5   Mucus PRESENT   CBC with Differential  Result Value Ref Range   WBC 7.0 4.0 - 10.5 K/uL   RBC 2.27 (L) 3.87 - 5.11 MIL/uL   Hemoglobin 8.5 (L) 12.0 - 15.0 g/dL   HCT 60.1 (L) 36 - 46 %   MCV 109.7 (H) 80.0 - 100.0 fL   MCH 37.4 (H) 26.0 - 34.0 pg   MCHC 34.1 30.0 - 36.0 g/dL   RDW 09.3 (H) 23.5 - 57.3 %   Platelets 133 (L) 150 - 400 K/uL   nRBC 0.0 0.0 - 0.2 %   Neutrophils  Relative % 77 %   Neutro Abs 5.4 1.7 - 7.7 K/uL   Lymphocytes Relative 15 %   Lymphs Abs 1.0 0.7 - 4.0 K/uL   Monocytes Relative 7 %   Monocytes Absolute 0.5 0.1 - 1.0 K/uL   Eosinophils Relative 0 %   Eosinophils Absolute 0.0 0.0 - 0.5 K/uL   Basophils Relative 0 %   Basophils Absolute 0.0 0.0 - 0.1 K/uL   Immature Granulocytes 1 %   Abs Immature Granulocytes 0.04 0.00 - 0.07 K/uL  I-Stat beta hCG blood, ED  Result Value Ref Range   I-stat hCG, quantitative <5.0 <5 mIU/mL   Comment 3             Dione Booze, MD 02/29/20 [phone]

## 2020-03-02 ENCOUNTER — Other Ambulatory Visit: Payer: Self-pay | Admitting: Primary Care

## 2020-03-02 DIAGNOSIS — I10 Essential (primary) hypertension: Secondary | ICD-10-CM

## 2020-03-02 NOTE — Telephone Encounter (Signed)
Pharmacy requests refill on: Amlodipine Besylate 10 mg   LAST REFILL: 10/22/2019 (Q-90, R-0) LAST OV: 07/27/2019 NEXT OV: Not Scheduled  PHARMACY: [id number] [place], [place]

## 2020-04-05 ENCOUNTER — Other Ambulatory Visit: Payer: Self-pay

## 2020-04-05 ENCOUNTER — Ambulatory Visit: Payer: 59 | Admitting: Diagnostic Neuroimaging

## 2020-04-05 ENCOUNTER — Encounter: Payer: Self-pay | Admitting: Diagnostic Neuroimaging

## 2020-04-05 VITALS — BP 143/91 | HR 115 | Ht 66.0 in | Wt 142.6 lb

## 2020-04-05 DIAGNOSIS — G40909 Epilepsy, unspecified, not intractable, without status epilepticus: Secondary | ICD-10-CM | POA: Diagnosis not present

## 2020-04-05 MED ORDER — LEVETIRACETAM 500 MG PO TABS: 500.0000 mg | ORAL_TABLET | Freq: Every day | ORAL | 0 refills | Status: DC

## 2020-04-05 MED ORDER — LAMOTRIGINE 100 MG PO TABS: 100.0000 mg | ORAL_TABLET | Freq: Two times a day (BID) | ORAL | 4 refills | Status: DC

## 2020-04-05 NOTE — Patient Instructions (Addendum)
-   consider MRI brain  - continue lamotrigine 100mg  twice a day for seizure prevention (can also help with mood stabilization)  - wean off levetiracetam (reduce to 500mg  daily x 2 weeks, then stop)  - bupropion can reduce seizure threshold; consider to reduce or change to another medication (such as an SSRI); patient will discuss with PCP  - caution with alcohol use and sleep deprivation  - According to [place] law, you can not drive unless you are seizure / syncope free for at least 6 months and under physician's care.   - Please maintain precautions. Do not participate in activities where a loss of awareness could harm you or someone else. No swimming alone, no tub bathing, no hot tubs, no driving, no operating motorized vehicles (cars, ATVs, motocycles, etc), lawnmowers, power tools or firearms. No standing at heights, such as rooftops, ladders or stairs. Avoid hot objects such as stoves, heaters, open fires. Wear a helmet when riding a bicycle, scooter, skateboard, etc. and avoid areas of traffic. Set your water heater to 120 degrees or less.

## 2020-04-05 NOTE — Progress Notes (Signed)
[facility]  PATIENT: Melissa Chapman DOB: December 06, 1985  REFERRING CLINICIAN: Pleas Koch, NP HISTORY FROM: patient and mother REASON FOR VISIT: follow up    HISTORICAL  CHIEF COMPLAINT:  Chief Complaint  Patient presents with  . Seizures    Rm 7, 6 month FU, motherJackelyn Chapman, "ED for seizure 11/28 d/t missing medications, no seizures since then"    HISTORY OF PRESENT ILLNESS:   UPDATE (04/05/20, VRP): Since last visit, had been intermittently taking lamotrigine (due to agitation side effects). Then had breakthrough seizure in 02/28/20 (at home, in bathroom, tongue biting, shaking, spitting up, post-ictal confusion). Went to ER, and then started on LEV + lamotrigine. Now tolerating both meds.   PRIOR HPI: 35 year old female here for evaluation of seizure vs syncope.  History of hypertension, anxiety, possible alcoholic pancreatitis in 0454 and 2018.   04/12/2018 patient was at home, woke up in the emergency room.  Apparently she had passed out.  She was diagnosed with dehydration and syncope.  06/28/2019 patient had just arrived in [place] with her son, was at the hotel when she collapsed and had seizure-like activity.  Apparently she woke up and was able to talk to the staff but then had a second event.  Patient does not remember waking up in between the 2 events.  She was taken to local hospital and had CT and EEG.  She does not have these results.  She does not know what the conclusion of her work-up was.  Patient was able to come back to the [place] safely.  Patient denies any excessive alcohol use recently.  She states she drinks about 3 drinks per week.  She did not have any alcohol on 06/28/2019.  Patient does endorse chronic sleep deprivation and irregular sleep pattern.  She has history of anxiety.  She has been on bupropion for 2 years.  This was increased in dosage in 09/07/2018 after her father passed away.  She was previously on Zoloft.  Patient has  had issues with chronic nausea, hypokalemia, abdominal pain in the past.  She also had pancreatitis x2 in 2017, 2018, possibly related to excessive alcohol use at that time.  No family history of seizure.  Patient lives at home with her 16-year-old son.  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  She is not currently working.    REVIEW OF SYSTEMS: Full 14 system review of systems performed and negative with exception of: as per HPI.  ALLERGIES: No Known Allergies  HOME MEDICATIONS: Outpatient Medications Prior to Visit  Medication Sig Dispense Refill  . acetaminophen (TYLENOL) 325 MG tablet Take 650 mg by mouth every 6 (six) hours as needed.    Marland Kitchen amLODipine (NORVASC) 10 MG tablet TAKE 1 TABLET BY MOUTH EVERY DAY FOR BLOOD PRESSURE 90 tablet 1  . buPROPion (ZYBAN) 150 MG 12 hr tablet TAKE 1 TABLET BY MOUTH TWICE A DAY 60 tablet 0  . ferrous sulfate 325 (65 FE) MG tablet Take 1 tablet (325 mg total) by mouth daily. 30 tablet 0  . lamoTRIgine (LAMICTAL) 25 MG tablet Take 25mg  daily for 2 weeks; then take 25mg  BID for 2 weeks; then take 50mg  BID for 2 weeks; then take 75mg  BID for 2 weeks; then 100mg  BID 240 tablet 0  . levETIRAcetam (KEPPRA) 500 MG tablet Take 1 tablet (500 mg total) by mouth 2 (two) times daily. 60 tablet 0  . diclofenac (  VOLTAREN) 50 MG EC tablet Take 100 mg by mouth 2 (two) times daily as needed. (Patient not taking: Reported on 04/05/2020)     No facility-administered medications prior to visit.    PAST MEDICAL HISTORY: Past Medical History:  Diagnosis Date  . Anxiety and depression   . Chronic hypokalemia   . Essential hypertension   . Pancreatitis   . Syncope    most recent March 2021    PAST SURGICAL HISTORY: No past surgical history on file.  FAMILY HISTORY: Family History  Problem Relation Age of Onset  . Depression Mother   . Hypertension Mother   . Hyperlipidemia Mother   . Hypertension Father    . Diabetes Father   . Hyperlipidemia Father   . Depression Sister   . Cancer Paternal Grandmother        Sinus     SOCIAL HISTORY: Social History   Socioeconomic History  . Marital status: Single    Spouse name: Not on file  . Number of children: 1  . Years of education: Not on file  . Highest education level: Bachelor's degree (e.g., BA, AB, BS)  Occupational History    Comment: RN   Tobacco Use  . Smoking status: Current Every Day Smoker    Packs/day: 0.25  . Smokeless tobacco: Never Used  . Tobacco comment: 04/05/20 "a little more"  Substance and Sexual Activity  . Alcohol use: Yes    Comment: socially  . Drug use: No  . Sexual activity: Yes  Other Topics Concern  . Not on file  Social History Narrative   Single.    1 son.   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   Social Determinants of Health   Financial Resource Strain: Not on file  Food Insecurity: Not on file  Transportation Needs: Not on file  Physical Activity: Not on file  Stress: Not on file  Social Connections: Not on file  Intimate Partner Violence: Not on file     PHYSICAL EXAM  GENERAL EXAM/CONSTITUTIONAL: Vitals:  Vitals:   04/05/20 0807  BP: (!) 143/91  Pulse: (!) 115  Weight: 142 lb 9.6 oz (64.7 kg)  Height: 5\' 6"  (1.676 m)   Body mass index is 23.02 kg/m. Wt Readings from Last 3 Encounters:  04/05/20 142 lb 9.6 oz (64.7 kg)  08/04/19 136 lb 9.6 oz (62 kg)  07/27/19 141 lb (64 kg)    Patient is in no distress; well developed, nourished and groomed; neck is supple  CARDIOVASCULAR:  Examination of carotid arteries is normal; no carotid bruits  TACHYCARDIA; no murmurs  Examination of peripheral vascular system by observation and palpation is normal  EYES:  Ophthalmoscopic exam of optic discs and posterior segments is normal; no papilledema or hemorrhages No exam data present  MUSCULOSKELETAL:  Gait, strength, tone, movements noted in  Neurologic exam below  NEUROLOGIC: MENTAL STATUS:  No flowsheet data found.  awake, alert, oriented to person, place and time  recent and remote memory intact  normal attention and concentration  language fluent, comprehension intact, naming intact  fund of knowledge appropriate  CRANIAL NERVE:   2nd - no papilledema on fundoscopic exam  2nd, 3rd, 4th, 6th - pupils equal and reactive to light, visual fields full to confrontation, extraocular muscles intact, no nystagmus  5th - facial sensation symmetric  7th - facial strength symmetric  8th - hearing intact  9th - palate elevates symmetrically, uvula midline  11th -  shoulder shrug symmetric  12th - tongue protrusion midline  MOTOR:   POSTURAL AND ACTION TREMOR IN BUE  normal bulk and tone, full strength in the BUE, BLE  SENSORY:   normal and symmetric to light touch, temperature, vibration  COORDINATION:   finger-nose-finger, fine finger movements normal  REFLEXES:   deep tendon reflexes SLIGHTLY BRISK and symmetric  GAIT/STATION:   narrow based gait     DIAGNOSTIC DATA (LABS, IMAGING, TESTING) - I reviewed patient records, labs, notes, testing and imaging myself where available.  Lab Results  Component Value Date   WBC 7.0 02/28/2020   HGB 8.5 (L) 02/28/2020   HCT 24.9 (L) 02/28/2020   MCV 109.7 (H) 02/28/2020   PLT 133 (L) 02/28/2020      Component Value Date/Time   NA 137 02/28/2020 2144   NA 138 12/13/2013 0445   K 3.9 02/28/2020 2144   K 3.2 (L) 12/13/2013 0445   CL 102 02/28/2020 2144   CL 105 12/13/2013 0445   CO2 20 (L) 02/28/2020 2144   CO2 26 12/13/2013 0445   GLUCOSE 135 (H) 02/28/2020 2144   GLUCOSE 75 12/13/2013 0445   BUN <5 (L) 02/28/2020 2144   BUN 4 (L) 12/13/2013 0445   CREATININE 0.72 02/28/2020 2144   CREATININE 0.84 12/13/2013 0445   CALCIUM 9.2 02/28/2020 2144   CALCIUM 7.3 (L) 12/13/2013 0445   PROT 7.7 02/28/2020 2144   PROT 7.6 12/11/2013 2310   ALBUMIN  3.8 02/28/2020 2144   ALBUMIN 3.8 12/11/2013 2310   AST 94 (H) 02/28/2020 2144   AST 40 (H) 12/11/2013 2310   ALT 19 02/28/2020 2144   ALT 23 12/11/2013 2310   ALKPHOS 139 (H) 02/28/2020 2144   ALKPHOS 65 12/11/2013 2310   BILITOT 1.9 (H) 02/28/2020 2144   BILITOT 0.6 12/11/2013 2310   GFRNONAA >60 02/28/2020 2144   GFRNONAA >60 12/13/2013 0445   GFRAA >60 06/18/2018 1858   GFRAA >60 12/13/2013 0445   Lab Results  Component Value Date   CHOL 216 (H) 07/27/2019   HDL 102.70 07/27/2019   LDLCALC 94 07/27/2019   LDLDIRECT 44.0 09/02/2017   TRIG 97.0 07/27/2019   CHOLHDL 2 07/27/2019   Lab Results  Component Value Date   HGBA1C 5.1 07/27/2019   No results found for: [id number] Lab Results  Component Value Date   TSH 1.48 07/27/2019    09/07/19 EEG  - Normal EEG in the awake and drowsy states.     ASSESSMENT AND PLAN  35 y.o. year old female here with:  Dx:  1. Seizure disorder [facility])      PLAN:  SEIZURE DISORDER --> last events 04/12/18, 06/28/19 x 2, 02/28/20  - consider MRI brain (ordered last visit, could not afford due to deductible)  - continue lamotrigine 100mg  twice a day for seizure prevention (can also help with mood stabilization)  - wean off levetiracetam (reduce to 500mg  daily x 2 weeks, then stop)  - bupropion can reduce seizure threshold; consider to reduce or change to another medication (such as an SSRI); patient will discuss with PCP  - caution with alcohol use and sleep deprivation  - According to  law, you can not drive unless you are seizure / syncope free for at least 6 months and under physician's care.   - Please maintain precautions. Do not participate in activities where a loss of awareness could harm you or someone else. No swimming alone, no tub bathing, no hot tubs, no driving, no  operating motorized vehicles (cars, ATVs, motocycles, etc), lawnmowers, power tools or firearms. No standing at heights, such as rooftops, ladders or  stairs. Avoid hot objects such as stoves, heaters, open fires. Wear a helmet when riding a bicycle, scooter, skateboard, etc. and avoid areas of traffic. Set your water heater to 120 degrees or less.  Meds ordered this encounter  Medications  . levETIRAcetam (KEPPRA) 500 MG tablet    Sig: Take 1 tablet (500 mg total) by mouth daily.    Dispense:  14 tablet    Refill:  0  . lamoTRIgine (LAMICTAL) 100 MG tablet    Sig: Take 1 tablet (100 mg total) by mouth 2 (two) times daily.    Dispense:  180 tablet    Refill:  4   Return in about 6 months (around 10/03/2020) for with NP (Amy Lomax).  I reviewed images, labs, notes, records myself. I summarized findings and reviewed with patient, for this high risk condition (seizure) requiring high complexity decision making.    Suanne Marker, MD 04/05/2020, 9:00 AM Certified in Neurology, Neurophysiology and Neuroimaging  [facility] [place] [facility] [address] [place], [place] [address] [phone]

## 2020-06-02 ENCOUNTER — Encounter (HOSPITAL_COMMUNITY): Payer: Self-pay | Admitting: Emergency Medicine

## 2020-06-02 ENCOUNTER — Other Ambulatory Visit: Payer: Self-pay

## 2020-06-02 ENCOUNTER — Ambulatory Visit (HOSPITAL_COMMUNITY)
Admission: EM | Admit: 2020-06-02 | Discharge: 2020-06-02 | Disposition: A | Discharge status: Home / Self Care | Payer: 59 | Attending: Family | Admitting: Family

## 2020-06-02 DIAGNOSIS — H5711 Ocular pain, right eye: Secondary | ICD-10-CM | POA: Diagnosis not present

## 2020-06-02 DIAGNOSIS — H5789 Other specified disorders of eye and adnexa: Secondary | ICD-10-CM | POA: Diagnosis not present

## 2020-06-02 DIAGNOSIS — H5712 Ocular pain, left eye: Secondary | ICD-10-CM

## 2020-06-02 MED ORDER — GENTAMICIN SULFATE 0.3 % OP SOLN: 1.0000 [drp] | Freq: Three times a day (TID) | OPHTHALMIC | 0 refills | Status: DC

## 2020-06-02 NOTE — ED Triage Notes (Signed)
Pt presents today with c/o of bilateral eye irritation. She believes she has both contact in eyes since yesterday morning.

## 2020-06-02 NOTE — Discharge Instructions (Addendum)
Recommend continue to monitor symptoms. May use Gentamicin eye drops- place 1 to 2 drops in both eyes 3 times a day for at least 3 to 5 days. Continue wearing glasses and do not put in another pair of contacts for at least 3 to 5 days. If eye symptoms do not improve by tomorrow, call [facility] to schedule appointment for further evaluation.

## 2020-06-03 NOTE — ED Provider Notes (Signed)
[facility]    CSN: [id number] Arrival date & time: 06/02/20  1223      History   Chief Complaint Chief Complaint  Patient presents with  . eye irritation    bilateral    HPI Melissa Chapman is a 35 y.o. female.   35 year old female presents with bilateral eye irritation that started yesterday. She wears soft contacts and slept with them in the night before. She thought they were "stuck" against her eye. Yesterday she tried to flush them out with saline eye drops but still feels like something is in both of her eyes. Now experiencing more pain, irritation and bilateral tearing. No colored or thick eye discharge. No fever. No URI symptoms. Very worried and nervous about something still in her eye and what we may need to do to get any contact or foreign body out of her eye. Vision has not changed significantly so feels that she would not be able to see if her contacts were not present, although she is wearing glasses as well. No current eye doctor. Other chronic health issues include HTN, anxiety and depression. Currently on Norvasc, Bupropion and Lamictal daily.   The history is provided by the patient.    Past Medical History:  Diagnosis Date  . Anxiety and depression   . Chronic hypokalemia   . Essential hypertension   . Pancreatitis   . Syncope    most recent March 2021    Patient Active Problem List   Diagnosis Date Noted  . Syncope 07/27/2019  . Prediabetes 07/27/2019  . Witnessed seizure-like activity (HCC) 07/27/2019  . Tachycardia 07/27/2019  . Chronic hypokalemia 10/11/2017  . Preventative health care 10/11/2017  . Hypertriglyceridemia 10/11/2017  . Elevated bilirubin 11/21/2016  . Migraine without status migrainosus, not intractable 11/15/2016  . Essential hypertension 10/22/2016  . Anxiety and depression 10/22/2016    History reviewed. No pertinent surgical history.  OB History   No obstetric history on file.      Home Medications     Prior to Admission medications   Medication Sig Start Date End Date Taking? Authorizing Provider  amLODipine (NORVASC) 10 MG tablet TAKE 1 TABLET BY MOUTH EVERY DAY FOR BLOOD PRESSURE 03/02/20  Yes Doreene Nest, NP  buPROPion (ZYBAN) 150 MG 12 hr tablet TAKE 1 TABLET BY MOUTH TWICE A DAY 08/26/18  Yes Doreene Nest, NP  gentamicin (GARAMYCIN) 0.3 % ophthalmic solution Place 1 drop into both eyes 3 (three) times daily. 06/02/20  Yes Sevin Langenbach, Ali Lowe, NP  lamoTRIgine (LAMICTAL) 100 MG tablet Take 1 tablet (100 mg total) by mouth 2 (two) times daily. 04/05/20  Yes Penumalli, Glenford Bayley, MD  acetaminophen (TYLENOL) 325 MG tablet Take 650 mg by mouth every 6 (six) hours as needed.    [provider]  ferrous sulfate 325 (65 FE) MG tablet Take 1 tablet (325 mg total) by mouth daily. 02/28/20 06/02/20  Little, Ambrose Finland, MD  levETIRAcetam (KEPPRA) 500 MG tablet Take 1 tablet (500 mg total) by mouth daily. 04/05/20 06/02/20  Penumalli, Glenford Bayley, MD    Family History Family History  Problem Relation Age of Onset  . Depression Mother   . Hypertension Mother   . Hyperlipidemia Mother   . Hypertension Father   . Diabetes Father   . Hyperlipidemia Father   . Depression Sister   . Cancer Paternal Grandmother        Sinus     Social History Social History  Tobacco Use  . Smoking status: Current Every Day Smoker    Packs/day: 0.25  . Smokeless tobacco: Never Used  . Tobacco comment: 04/05/20 "a little more"  Substance Use Topics  . Alcohol use: Yes    Comment: socially  . Drug use: No     Allergies   Patient has no known allergies.   Review of Systems Review of Systems  Constitutional: Negative for activity change, appetite change, chills, fatigue and fever.  HENT: Negative for congestion, facial swelling, postnasal drip, sinus pressure, sinus pain and sore throat.   Eyes: Positive for photophobia, pain, discharge (clear- tearing bilaterally) and redness. Negative for  itching and visual disturbance.  Gastrointestinal: Negative for nausea and vomiting.  Musculoskeletal: Negative for arthralgias and myalgias.  Skin: Negative for color change and rash.  Allergic/Immunologic: Negative for environmental allergies, food allergies and immunocompromised state.  Neurological: Negative for dizziness, tremors, facial asymmetry, speech difficulty, weakness, light-headedness and numbness.  Hematological: Negative for adenopathy. Does not bruise/bleed easily.  Psychiatric/Behavioral: The patient is nervous/anxious.      Physical Exam Triage Vital Signs ED Triage Vitals  Enc Vitals Group     BP 06/02/20 1309 134/75     Pulse Rate 06/02/20 1309 (!) 120     Resp 06/02/20 1309 20     Temp 06/02/20 1309 99.1 F (37.3 C)     Temp Source 06/02/20 1309 Oral     SpO2 06/02/20 1309 100 %     Weight 06/02/20 1306 147 lb (66.7 kg)     Height 06/02/20 1306 5\' 7"  (1.702 m)     Head Circumference --      Peak Flow --      Pain Score 06/02/20 1306 2     Pain Loc --      Pain Edu? --      Excl. in GC? --    No data found.  Updated Vital Signs BP 134/75 (BP Location: Left Arm)   Pulse (!) 120   Temp 99.1 F (37.3 C) (Oral)   Resp 20   Ht 5\' 7"  (1.702 m)   Wt 147 lb (66.7 kg)   SpO2 100%   BMI 23.02 kg/m   Visual Acuity Right Eye Distance:   Left Eye Distance:   Bilateral Distance:    Right Eye Near:   Left Eye Near:    Bilateral Near:     Physical Exam Vitals and nursing note reviewed.  Constitutional:      General: She is awake. She is not in acute distress.    Appearance: She is well-developed and well-groomed.     Comments: She is sitting on the exam table in no acute distress but appears nervous.   HENT:     Head: Normocephalic and atraumatic.     Right Ear: Hearing, tympanic membrane, ear canal and external ear normal.     Left Ear: Hearing, tympanic membrane, ear canal and external ear normal.     Nose: Nose normal.     Right Sinus: No  maxillary sinus tenderness or frontal sinus tenderness.     Left Sinus: No maxillary sinus tenderness or frontal sinus tenderness.     Mouth/Throat:     Lips: Pink.     Mouth: Mucous membranes are moist.     Pharynx: Oropharynx is clear. Uvula midline.  Eyes:     General: Lids are normal. Lids are everted, no foreign bodies appreciated. Vision grossly intact. Gaze aligned appropriately. No visual field deficit or  scleral icterus.       Right eye: No foreign body, discharge or hordeolum.        Left eye: No foreign body, discharge or hordeolum.     Extraocular Movements: Extraocular movements intact.     Conjunctiva/sclera:     Right eye: Chemosis present. No exudate or hemorrhage.    Left eye: Chemosis present. No exudate or hemorrhage.    Pupils: Pupils are equal, round, and reactive to light.     Right eye: Fluorescein uptake present.     Left eye: Fluorescein uptake present.     Funduscopic exam:    Right eye: No hemorrhage or exudate. Red reflex present.        Left eye: No hemorrhage or exudate. Red reflex present.     Comments: Applied Tetracaine eye drops to both eyes. Then applied fluorescein stain. No deep corneal abrasion seen but slight uptake and corneal irritation seen on lateral aspect of left cornea around 3pm. Slight corneal irritation of right eye just below iris. Used Q-tip to assist with exam- no distinct foreign bodies seen. No contacts present in either eye. Dr. Tracie Harrier also examined both eyes and also did not see any foreign bodies or contacts.   Cardiovascular:     Rate and Rhythm: Tachycardia present.  Pulmonary:     Effort: Pulmonary effort is normal.  Musculoskeletal:     Cervical back: Normal range of motion and neck supple. No tenderness.  Lymphadenopathy:     Cervical: No cervical adenopathy.  Skin:    General: Skin is warm and dry.     Findings: No erythema or rash.  Neurological:     General: No focal deficit present.     Mental Status: She is alert  and oriented to person, place, and time.  Psychiatric:        Attention and Perception: Attention normal.        Mood and Affect: Mood is anxious. Affect is tearful.        Speech: Speech normal.        Behavior: Behavior is cooperative.        Thought Content: Thought content normal.        Cognition and Memory: Cognition normal.      UC Treatments / Results  Labs (all labs ordered are listed, but only abnormal results are displayed) Labs Reviewed - No data to display  EKG   Radiology No results found.  Procedures Procedures (including critical care time)  Medications Ordered in UC Medications - No data to display  Initial Impression / Assessment and Plan / UC Course  I have reviewed the triage vital signs and the nursing notes.  Pertinent labs & imaging results that were available during my care of the patient were reviewed by me and considered in my medical decision making (see chart for details).    Reviewed with patient that no contacts seen in either eye. However, we are not eye specialists and offered patient opportunity to see an Eye Doctor today if desired. Patient declined for now but provided information on [facility] to patient. Discussed that she does not have a significant corneal abrasion but does have corneal irritation from her contacts and rubbing her eyes. Due to irritation of eyes and use of foreign object (Q-tip) directly on cornea, recommend Gentamicin eye drops 1 to 2 drops in both eyes 3 times a day for 3 to 5 days. Continue to wear glasses. Do not put another pair of  contacts in for at least 3 days. If eye symptoms do not improve by tomorrow, [facility] for further evaluation.  Final Clinical Impressions(s) / UC Diagnoses   Final diagnoses:  Irritation of both eyes  Pain in right eye  Pain in left eye     Discharge Instructions     Recommend continue to monitor symptoms. May use Gentamicin eye drops- place 1 to 2  drops in both eyes 3 times a day for at least 3 to 5 days. Continue wearing glasses and do not put in another pair of contacts for at least 3 to 5 days. If eye symptoms do not improve by tomorrow, call [facility] to schedule appointment for further evaluation.     ED Prescriptions    Medication Sig Dispense Auth. Provider   gentamicin (GARAMYCIN) 0.3 % ophthalmic solution Place 1 drop into both eyes 3 (three) times daily. 5 mL Sudie GrumblingAmyot, Marga Gramajo Berry, NP     PDMP not reviewed this encounter.   Sudie GrumblingAmyot, Keaden Gunnoe Berry, NP 06/03/20 1408

## 2020-07-26 ENCOUNTER — Telehealth: Payer: Self-pay | Admitting: Diagnostic Neuroimaging

## 2020-07-26 NOTE — Telephone Encounter (Signed)
Pt is needing a refill on her lamoTRIgine (LAMICTAL) 100 MG tablet sent in to the [facility] in [place] Pt states that the pharmacist informed her to call the office to put in the request.

## 2020-07-26 NOTE — Telephone Encounter (Signed)
Called patient and informed her Dr Marjory Lies refilled lamictal in Jan. She stated pharmacy told her they don't have new Rx. I advised will call pharmacy. Patient verbalized understanding, appreciation. [facility], spoke with Trinna Post who stated he did see Jan Rx, stated they still have Rx for 25 mg tablets. I advised he discontinue the 25 mg tablets. Alex stated he will get new Rx ready for patient and call her,  verbalized understanding, appreciation.

## 2020-09-01 ENCOUNTER — Telehealth: Payer: Self-pay | Admitting: Diagnostic Neuroimaging

## 2020-09-01 DIAGNOSIS — R251 Tremor, unspecified: Secondary | ICD-10-CM

## 2020-09-01 DIAGNOSIS — G40909 Epilepsy, unspecified, not intractable, without status epilepticus: Secondary | ICD-10-CM

## 2020-09-01 DIAGNOSIS — Z5181 Encounter for therapeutic drug level monitoring: Secondary | ICD-10-CM

## 2020-09-01 NOTE — Addendum Note (Signed)
Addended by: [place] Pink on: 09/01/2020 12:18 PM   Modules accepted: Orders

## 2020-09-01 NOTE — Telephone Encounter (Signed)
Called patient and advised Dr Marjory Lies recommends she FU with PCP and have several labs done for evaluation. She would like labs done here; I advised will put in orders, no appointment needed. She is aware we are closed tomorrow. Patient verbalized understanding, appreciation. Orders placed.

## 2020-09-01 NOTE — Telephone Encounter (Signed)
Recommend lab and PCP eval: CBC, CMP, TSH, A1c, UA, lamictal level. -VRP

## 2020-09-01 NOTE — Telephone Encounter (Addendum)
Called patient who stated past 3-4 days she's noted shakiness from activity such as trying to type/write. Hands are shaking, sometimes her feet. She is taking lamictal as prescribed and Bupropion. She's not talked to PCP about possibly switching off bupropion due to lack of insurance until recently. She stated she has a lot of increased stress from a new job and stress at home, feels this may be anxiety. She stated she doesn't want medication for anxiety, wondered if increasing her lamictal would help.    I advised she call PCP if she feels it's anxiety. Advised I will discuss with Dr Marjory Lies and call her later today. Patient asking for quick reply,  doesn't feel need to call PCP.  She is at work and uncomfortable. She  verbalized understanding, appreciation.

## 2020-09-01 NOTE — Telephone Encounter (Addendum)
Pt states for the last couple of days she has been shaky, she would like to have something called into [facility], please call.

## 2020-09-05 ENCOUNTER — Ambulatory Visit: Payer: 59 | Admitting: Diagnostic Neuroimaging

## 2020-09-05 ENCOUNTER — Encounter: Payer: Self-pay | Admitting: Diagnostic Neuroimaging

## 2020-09-05 ENCOUNTER — Telehealth: Payer: Self-pay | Admitting: Diagnostic Neuroimaging

## 2020-09-05 ENCOUNTER — Other Ambulatory Visit: Payer: Self-pay | Admitting: *Deleted

## 2020-09-05 VITALS — BP 121/77 | HR 104 | Ht 66.0 in | Wt 155.0 lb

## 2020-09-05 DIAGNOSIS — G40909 Epilepsy, unspecified, not intractable, without status epilepticus: Secondary | ICD-10-CM

## 2020-09-05 DIAGNOSIS — R251 Tremor, unspecified: Secondary | ICD-10-CM | POA: Diagnosis not present

## 2020-09-05 DIAGNOSIS — Z5181 Encounter for therapeutic drug level monitoring: Secondary | ICD-10-CM

## 2020-09-05 NOTE — Progress Notes (Signed)
[facility]  PATIENT: Melissa Chapman DOB: [birth year]  REFERRING CLINICIAN: Doreene Nest, NP HISTORY FROM: patient and mother REASON FOR VISIT: follow up    HISTORICAL  CHIEF COMPLAINT:  Chief Complaint  Patient presents with  . Seizures    Rm 6 mom- Debbie  "early FU requested to discuss medication, shakiness, other symptoms; seizure witnessed by mom 09/02/20"    HISTORY OF PRESENT ILLNESS:   UPDATE (09/05/20, VRP): Since last visit, was doing well until last couple of weeks with severely increased stress and anxiety.  Patient was feeling more jittery and shaky and called our office on 09/01/2020.  The next day she was having memory lapse, confusion, and other symptoms that prompted her mother to go check on her.  Patient's mother witnessed a abnormal spell of loss of consciousness which lasted for few seconds.  They called 911 for evaluation and paramedics arrived on scene.  Patient returned to baseline fairly quickly and declined to go to the emergency room.  Since then patient continues to feel jittery and shaky.  UPDATE (04/05/20, VRP): Since last visit, had been intermittently taking lamotrigine (due to agitation side effects). Then had breakthrough seizure in 02/28/20 (at home, in bathroom, tongue biting, shaking, spitting up, post-ictal confusion). Went to ER, and then started on LEV + lamotrigine. Now tolerating both meds.   PRIOR HPI: 35 year old female here for evaluation of seizure vs syncope.  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   04/12/2018 patient was at home, woke up in the emergency room.  Apparently she had passed out.  She was diagnosed with dehydration and syncope.  06/28/2019 patient had just arrived in [place] with her son, was at the hotel when she collapsed and had seizure-like activity.  Apparently she woke up and was able to talk to the staff but then had a second event.  Patient does not remember  waking up in between the 2 events.  She was taken to local hospital and had CT and EEG.  She does not have these results.  She does not know what the conclusion of her work-up was.  Patient was able to come back to the [place] safely.  Patient denies any excessive alcohol use recently.  She states she drinks about 3 drinks per week.  She did not have any alcohol on 06/28/2019.  Patient does endorse chronic sleep deprivation and irregular sleep pattern.  She has history of anxiety.  She has been on bupropion for 2 years.  This was increased in dosage in 06/05/2020after her father passed away.  She was previously on Zoloft.  Patient has had issues with chronic nausea, hypokalemia, abdominal pain in the past.  She also had pancreatitis x2 in 2017, 2018, possibly related to excessive alcohol use at that time.  No family history of seizure.  Patient lives at home with her 86-year-old son.  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  She is not currently working.    REVIEW OF SYSTEMS: Full 14 system review of systems performed and negative with exception of: as per HPI.  ALLERGIES: No Known Allergies  HOME MEDICATIONS: Outpatient Medications Prior to Visit  Medication Sig Dispense Refill  . acetaminophen (TYLENOL) 325 MG tablet Take 650 mg by mouth every 6 (six) hours as needed.    Marland Kitchen amLODipine (NORVASC) 10 MG tablet TAKE 1 TABLET BY MOUTH EVERY DAY FOR BLOOD  PRESSURE 90 tablet 1  . buPROPion (ZYBAN) 150 MG 12 hr tablet TAKE 1 TABLET BY MOUTH TWICE A DAY 60 tablet 0  . lamoTRIgine (LAMICTAL) 100 MG tablet Take 1 tablet (100 mg total) by mouth 2 (two) times daily. 180 tablet 4  . gentamicin (GARAMYCIN) 0.3 % ophthalmic solution Place 1 drop into both eyes 3 (three) times daily. (Patient not taking: Reported on 09/05/2020) 5 mL 0   No facility-administered medications prior to visit.    PAST MEDICAL HISTORY: Past Medical History:  Diagnosis  Date  . Anxiety and depression   . Chronic hypokalemia   . Essential hypertension   . Pancreatitis   . Seizures (HCC)    last seizure 09/02/20  . Syncope    most recent March 2021    PAST SURGICAL HISTORY: No past surgical history on file.  FAMILY HISTORY: Family History  Problem Relation Age of Onset  . Depression Mother   . Hypertension Mother   . Hyperlipidemia Mother   . Hypertension Father   . Diabetes Father   . Hyperlipidemia Father   . Depression Sister   . Cancer Paternal Grandmother        Sinus     SOCIAL HISTORY: Social History   Socioeconomic History  . Marital status: Single    Spouse name: Not on file  . Number of children: 1  . Years of education: Not on file  . Highest education level: Bachelor's degree (e.g., BA, AB, BS)  Occupational History    Comment: RN [employer]  Tobacco Use  . Smoking status: Current Every Day Smoker    Packs/day: 0.25  . Smokeless tobacco: Never Used  . Tobacco comment: 04/05/20 "a little more"  Substance and Sexual Activity  . Alcohol use: Yes    Comment: socially  . Drug use: No  . Sexual activity: Yes  Other Topics Concern  . Not on file  Social History Narrative   Single.    1 son.   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   Social Determinants of Health   Financial Resource Strain: Not on file  Food Insecurity: Not on file  Transportation Needs: Not on file  Physical Activity: Not on file  Stress: Not on file  Social Connections: Not on file  Intimate Partner Violence: Not on file     PHYSICAL EXAM  GENERAL EXAM/CONSTITUTIONAL: Vitals:  Vitals:   09/05/20 1602  BP: 121/77  Pulse: (!) 104  Weight: 155 lb (70.3 kg)  Height: 5\' 6"  (1.676 m)   Body mass index is 25.02 kg/m. Wt Readings from Last 3 Encounters:  09/05/20 155 lb (70.3 kg)  06/02/20 147 lb (66.7 kg)  04/05/20 142 lb 9.6 oz (64.7 kg)    Patient is in no distress; well developed, nourished and groomed;  neck is supple  CARDIOVASCULAR:  Examination of carotid arteries is normal; no carotid bruits  TACHYCARDIA; no murmurs  Examination of peripheral vascular system by observation and palpation is normal  EYES:  Ophthalmoscopic exam of optic discs and posterior segments is normal; no papilledema or hemorrhages No exam data present  MUSCULOSKELETAL:  Gait, strength, tone, movements noted in Neurologic exam below  NEUROLOGIC: MENTAL STATUS:  No flowsheet data found.  awake, alert, oriented to person, place and time  recent and remote memory intact  normal attention and concentration  language fluent, comprehension intact, naming intact  fund of knowledge appropriate  CRANIAL NERVE:   2nd -  no papilledema on fundoscopic exam  2nd, 3rd, 4th, 6th - pupils equal and reactive to light, visual fields full to confrontation, extraocular muscles intact, no nystagmus  5th - facial sensation symmetric  7th - facial strength symmetric  8th - hearing intact  9th - palate elevates symmetrically, uvula midline  11th - shoulder shrug symmetric  12th - tongue protrusion midline  MOTOR:   POSTURAL AND ACTION TREMOR IN BUE  normal bulk and tone, full strength in the BUE, BLE  SENSORY:   normal and symmetric to light touch, temperature, vibration  COORDINATION:   finger-nose-finger, fine finger movements normal  REFLEXES:   deep tendon reflexes SLIGHTLY BRISK and symmetric  GAIT/STATION:   narrow based gait     DIAGNOSTIC DATA (LABS, IMAGING, TESTING) - I reviewed patient records, labs, notes, testing and imaging myself where available.  Lab Results  Component Value Date   WBC 7.0 02/28/2020   HGB 8.5 (L) 02/28/2020   HCT 24.9 (L) 02/28/2020   MCV 109.7 (H) 02/28/2020   PLT 133 (L) 02/28/2020      Component Value Date/Time   NA 137 02/28/2020 2144   NA 138 12/13/2013 0445   K 3.9 02/28/2020 2144   K 3.2 (L) 12/13/2013 0445   CL 102 02/28/2020 2144    CL 105 12/13/2013 0445   CO2 20 (L) 02/28/2020 2144   CO2 26 12/13/2013 0445   GLUCOSE 135 (H) 02/28/2020 2144   GLUCOSE 75 12/13/2013 0445   BUN <5 (L) 02/28/2020 2144   BUN 4 (L) 12/13/2013 0445   CREATININE 0.72 02/28/2020 2144   CREATININE 0.84 12/13/2013 0445   CALCIUM 9.2 02/28/2020 2144   CALCIUM 7.3 (L) 12/13/2013 0445   PROT 7.7 02/28/2020 2144   PROT 7.6 12/11/2013 2310   ALBUMIN 3.8 02/28/2020 2144   ALBUMIN 3.8 12/11/2013 2310   AST 94 (H) 02/28/2020 2144   AST 40 (H) 12/11/2013 2310   ALT 19 02/28/2020 2144   ALT 23 12/11/2013 2310   ALKPHOS 139 (H) 02/28/2020 2144   ALKPHOS 65 12/11/2013 2310   BILITOT 1.9 (H) 02/28/2020 2144   BILITOT 0.6 12/11/2013 2310   GFRNONAA >60 02/28/2020 2144   GFRNONAA >60 12/13/2013 0445   GFRAA >60 06/18/2018 1858   GFRAA >60 12/13/2013 0445   Lab Results  Component Value Date   CHOL 216 (H) 07/27/2019   HDL 102.70 07/27/2019   LDLCALC 94 07/27/2019   LDLDIRECT 44.0 09/02/2017   TRIG 97.0 07/27/2019   CHOLHDL 2 07/27/2019   Lab Results  Component Value Date   HGBA1C 5.1 07/27/2019   No results found for: [id number] Lab Results  Component Value Date   TSH 1.48 07/27/2019    09/07/19 EEG  - Normal EEG in the awake and drowsy states.     ASSESSMENT AND PLAN  35 y.o. year old female here with:  Dx:  1. Seizure disorder [facility](HCC)      PLAN:  SEIZURE DISORDER --> last events 04/12/18, 06/28/19 x 2, 02/28/20, 09/05/20  - check MRI brain, EEG, labs  - continue lamotrigine 100mg  twice a day for seizure prevention (can also help with mood stabilization)  - bupropion can reduce seizure threshold; consider to reduce or change to another medication (such as an SSRI); patient will discuss with PCP  - caution with alcohol use and sleep deprivation  - According to [place] law, you can not drive unless you are seizure / syncope free for at least 6 months and under physician's  care.   - Please maintain precautions. Do not  participate in activities where a loss of awareness could harm you or someone else. No swimming alone, no tub bathing, no hot tubs, no driving, no operating motorized vehicles (cars, ATVs, motocycles, etc), lawnmowers, power tools or firearms. No standing at heights, such as rooftops, ladders or stairs. Avoid hot objects such as stoves, heaters, open fires. Wear a helmet when riding a bicycle, scooter, skateboard, etc. and avoid areas of traffic. Set your water heater to 120 degrees or less.   ANXIETY / DEPRESSION / STRESS - follow up with PCP; consider psychiatry / psychology follow up  No orders of the defined types were placed in this encounter.  Return in about 6 months (around 03/07/2021).   I reviewed images, labs, notes, records myself. I summarized findings and reviewed with patient, for this high risk condition (recurrent seizure) requiring high complexity decision making.    Suanne Marker, MD 09/05/2020, 4:21 PM Certified in Neurology, Neurophysiology and Neuroimaging  [facility] [address], [address] [place], [place] [address] [phone]

## 2020-09-05 NOTE — Telephone Encounter (Signed)
Pt called, need sooner appt. Would like a call from the nurse.

## 2020-09-05 NOTE — Telephone Encounter (Signed)
[facility]: [id number] exp. 09/05/2020 - 10/04/2020 sent to GI

## 2020-09-05 NOTE — Patient Instructions (Signed)
  SEIZURE DISORDER --> last events 04/12/18, 06/28/19 x 2, 02/28/20, 09/05/20  - check MRI brain, EEG, labs  - continue lamotrigine 100mg  twice a day for seizure prevention (can also help with mood stabilization)  - bupropion can reduce seizure threshold; consider to reduce or change to another medication (such as an SSRI); patient will discuss with PCP  - caution with alcohol use and sleep deprivation  - According to [place] law, you can not drive unless you are seizure / syncope free for at least 6 months and under physician's care.   - Please maintain precautions. Do not participate in activities where a loss of awareness could harm you or someone else. No swimming alone, no tub bathing, no hot tubs, no driving, no operating motorized vehicles (cars, ATVs, motocycles, etc), lawnmowers, power tools or firearms. No standing at heights, such as rooftops, ladders or stairs. Avoid hot objects such as stoves, heaters, open fires. Wear a helmet when riding a bicycle, scooter, skateboard, etc. and avoid areas of traffic. Set your water heater to 120 degrees or less.

## 2020-09-05 NOTE — Telephone Encounter (Signed)
Called patient who stated she wants to be seen sooner than 10/05/20 due to her symptoms. See note dated 09/01/20. scheduled her for this afternoon; Patient verbalized understanding, appreciation.

## 2020-09-06 ENCOUNTER — Telehealth: Payer: Self-pay | Admitting: *Deleted

## 2020-09-06 LAB — URINALYSIS, ROUTINE W REFLEX MICROSCOPIC
Bilirubin, UA: NEGATIVE
Glucose, UA: NEGATIVE
Ketones, UA: NEGATIVE
Leukocytes,UA: NEGATIVE
Nitrite, UA: NEGATIVE
Protein,UA: NEGATIVE
Specific Gravity, UA: 1.009 (ref 1.005–1.030)
Urobilinogen, Ur: 0.2 mg/dL (ref 0.2–1.0)
pH, UA: 7 (ref 5.0–7.5)

## 2020-09-06 LAB — MICROSCOPIC EXAMINATION
Casts: NONE SEEN /lpf
Epithelial Cells (non renal): >10 /hpf — AB (ref 0–10)

## 2020-09-06 LAB — COMPREHENSIVE METABOLIC PANEL
ALT: 20 IU/L (ref 0–32)
AST: 23 IU/L (ref 0–40)
Albumin/Globulin Ratio: 1.6 (ref 1.2–2.2)
Albumin: 4.9 g/dL — ABNORMAL HIGH (ref 3.8–4.8)
Alkaline Phosphatase: 114 IU/L (ref 44–121)
BUN/Creatinine Ratio: 16 (ref 9–23)
BUN: 11 mg/dL (ref 6–20)
Bilirubin Total: 0.3 mg/dL (ref 0.0–1.2)
CO2: 19 mmol/L — ABNORMAL LOW (ref 20–29)
Calcium: 10.1 mg/dL (ref 8.7–10.2)
Chloride: 101 mmol/L (ref 96–106)
Creatinine, Ser: 0.68 mg/dL (ref 0.57–1.00)
Globulin, Total: 3 g/dL (ref 1.5–4.5)
Glucose: 120 mg/dL — ABNORMAL HIGH (ref 65–99)
Potassium: 3.9 mmol/L (ref 3.5–5.2)
Sodium: 138 mmol/L (ref 134–144)
Total Protein: 7.9 g/dL (ref 6.0–8.5)
eGFR: 116 mL/min/{1.73_m2} (ref >59)

## 2020-09-06 LAB — CBC WITH DIFFERENTIAL/PLATELET
Basophils Absolute: 0 10*3/uL (ref 0.0–0.2)
Basos: 0 %
EOS (ABSOLUTE): 0 10*3/uL (ref 0.0–0.4)
Eos: 1 %
Hematocrit: 29.1 % — ABNORMAL LOW (ref 34.0–46.6)
Hemoglobin: 10.2 g/dL — ABNORMAL LOW (ref 11.1–15.9)
Immature Grans (Abs): 0 10*3/uL (ref 0.0–0.1)
Immature Granulocytes: 1 %
Lymphocytes Absolute: 1.6 10*3/uL (ref 0.7–3.1)
Lymphs: 29 %
MCH: 36.3 pg — ABNORMAL HIGH (ref 26.6–33.0)
MCHC: 35.1 g/dL (ref 31.5–35.7)
MCV: 104 fL — ABNORMAL HIGH (ref 79–97)
Monocytes Absolute: 0.7 10*3/uL (ref 0.1–0.9)
Monocytes: 12 %
Neutrophils Absolute: 3.1 10*3/uL (ref 1.4–7.0)
Neutrophils: 57 %
Platelets: 326 10*3/uL (ref 150–450)
RBC: 2.81 x10E6/uL — ABNORMAL LOW (ref 3.77–5.28)
RDW: 16.1 % — ABNORMAL HIGH (ref 11.7–15.4)
WBC: 5.4 10*3/uL (ref 3.4–10.8)

## 2020-09-06 LAB — HEMOGLOBIN A1C
Est. average glucose Bld gHb Est-mCnc: 105 mg/dL
Hgb A1c MFr Bld: 5.3 % (ref 4.8–5.6)

## 2020-09-06 LAB — LAMOTRIGINE LEVEL: Lamotrigine Lvl: 8.1 ug/mL (ref 2.0–20.0)

## 2020-09-06 LAB — TSH: TSH: 2.61 u[IU]/mL (ref 0.450–4.500)

## 2020-09-06 NOTE — Telephone Encounter (Signed)
Called patient and advised her labs ok overall, except anemia needs PCP follow up.Her  UA is likely contaminated specimen, no definite infection. She can touch base with PCP as well. Patient stated she has FU with PCP on Friday, will discuss lab results with her,  verbalized understanding, appreciation.

## 2020-09-12 ENCOUNTER — Other Ambulatory Visit: Payer: 59

## 2020-09-12 ENCOUNTER — Telehealth: Payer: Self-pay

## 2020-09-12 NOTE — Telephone Encounter (Signed)
Pt was placed on Tower's schedule for 6/15 due to her requesting that day because she was also seeing her neurologist on this day and didn't want to take off more days of work if not necessary. Pt wants to be seen for "update on new medical hx, medications, and other recommendations discuss new neuro incidents.  Scheduling Notes: F/u- last seen 4.26.21 Update on new medical hx .. medications.Marland Kitchen any other recommendations discuss new neuro incidents  This was brought to this nurses attention because the pt should see her PCP for these concerns.  Contacted the pt and advised. She did not want to change the day of the apt but she agreed to apt with PCP for tomorrow. Advised if anything changed to contact office. Pt verbalized understanding.

## 2020-09-13 ENCOUNTER — Encounter: Payer: Self-pay | Admitting: Primary Care

## 2020-09-13 ENCOUNTER — Ambulatory Visit (INDEPENDENT_AMBULATORY_CARE_PROVIDER_SITE_OTHER): Payer: 59 | Admitting: Primary Care

## 2020-09-13 ENCOUNTER — Other Ambulatory Visit: Payer: Self-pay

## 2020-09-13 VITALS — BP 121/77 | HR 76 | Temp 97.8°F | Ht 66.0 in | Wt 152.0 lb

## 2020-09-13 DIAGNOSIS — F419 Anxiety disorder, unspecified: Secondary | ICD-10-CM

## 2020-09-13 DIAGNOSIS — E876 Hypokalemia: Secondary | ICD-10-CM

## 2020-09-13 DIAGNOSIS — F32A Depression, unspecified: Secondary | ICD-10-CM

## 2020-09-13 DIAGNOSIS — D649 Anemia, unspecified: Secondary | ICD-10-CM

## 2020-09-13 DIAGNOSIS — R7303 Prediabetes: Secondary | ICD-10-CM

## 2020-09-13 DIAGNOSIS — Z1159 Encounter for screening for other viral diseases: Secondary | ICD-10-CM

## 2020-09-13 DIAGNOSIS — G40909 Epilepsy, unspecified, not intractable, without status epilepticus: Secondary | ICD-10-CM | POA: Diagnosis not present

## 2020-09-13 DIAGNOSIS — I10 Essential (primary) hypertension: Secondary | ICD-10-CM

## 2020-09-13 MED ORDER — ESCITALOPRAM OXALATE 10 MG PO TABS: 10.0000 mg | ORAL_TABLET | Freq: Every day | ORAL | 1 refills | Status: DC

## 2020-09-13 NOTE — Assessment & Plan Note (Signed)
Controlled in the office today, continue amlodipine 10 mg.  ° °

## 2020-09-13 NOTE — Progress Notes (Signed)
Subjective:    Patient ID: Melissa Chapman, female    DOB: 03/12/1986, 35 y.o.   MRN: [id number]  HPI  Melissa Chapman is a very pleasant 35 y.o. female with a history of hypertension, migraines, seizure disorder, anxiety and depression who presents today for follow up.  1) Hypertension: Currently managed on amlodipine 10 mg for hypertension.  BP Readings from Last 3 Encounters:  09/13/20 121/77  09/05/20 121/77  06/02/20 134/75     2) Seizure Disorder: Following with neurology, managed on Lamictal 100 mg BID for seizure disorder. Last appointment was September 05, 2020 at [facility]. During this visit her Lamictal 100 mg was continued but she was advised to stop Wellbutrin due to the potential to lower seizure threshold.   Wellbutrin has not been refilled by me since May 2020 for 1 month supply. She is currently getting this medication from her GYN and been taking BID.   She is to undergo MRI brain, EEG. She underwent lab work which showed chronic anemia. She was advised to see PCP.  3) Anemia: Chronic, last hemoglobin of 10.2 which was last week. Long history of anemia over the years which hemoglobin ranges 8.5 to 10.7 over the last three years. Urine microscopic collected last week which shows 3-10 RBC's, MCV of 100-104 over the years. She is not on a B12 supplement.   She denies menstrual cycles as she has an IUD. She does have breakthrough vaginal bleeding lasting one day occurring every 2-3 months, not heavy. She denies rectal bleeding. She does not take vitamin supplements or iron supplements.   4) Anxiety and Depression: Currently managed on bupropion 150 mg BID for which she's been taking for years. She gets this medication from her GYN. Currently she struggles with anxiety. Symptoms include irritability, feeling anxious, feeling nervous, worrying occurs daily. Her neurologist has requested she come off bupropion due to the risk for decreased seizure threshold. She has tried Zoloft but  didn't feel that it was effective.   Review of Systems  Constitutional:  Positive for fatigue.  Respiratory:  Negative for shortness of breath.   Cardiovascular:  Negative for chest pain.  Gastrointestinal:  Negative for blood in stool.  Genitourinary:  Negative for vaginal bleeding.  Neurological:  Positive for seizures. Negative for headaches.  Psychiatric/Behavioral:  The patient is nervous/anxious.        See HPI        Past Medical History:  Diagnosis Date   Anxiety and depression    Chronic hypokalemia    Essential hypertension    Pancreatitis    Seizures (HCC)    last seizure 09/02/20   Syncope    most recent March 2021    Social History   Socioeconomic History   Marital status: Single    Spouse name: Not on file   Number of children: 1   Years of education: Not on file   Highest education level: Bachelor's degree (e.g., BA, AB, BS)  Occupational History    Comment: RN [employer]  Tobacco Use   Smoking status: Every Day    Packs/day: 0.25    Pack years: 0.00    Types: Cigarettes   Smokeless tobacco: Never   Tobacco comments:    04/05/20 "a little more"  Substance and Sexual Activity   Alcohol use: Yes    Comment: socially   Drug use: No   Sexual activity: Yes  Other Topics Concern   Not on file  Social History Narrative   Single.  1 son.   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   Social Determinants of Health   Financial Resource Strain: Not on file  Food Insecurity: Not on file  Transportation Needs: Not on file  Physical Activity: Not on file  Stress: Not on file  Social Connections: Not on file  Intimate Partner Violence: Not on file    History reviewed. No pertinent surgical history.  Family History  Problem Relation Age of Onset   Depression Mother    Hypertension Mother    Hyperlipidemia Mother    Hypertension Father    Diabetes Father    Hyperlipidemia Father    Depression Sister    Cancer Paternal  Grandmother        Sinus     No Known Allergies  Current Outpatient Medications on File Prior to Visit  Medication Sig Dispense Refill   acetaminophen (TYLENOL) 325 MG tablet Take 650 mg by mouth every 6 (six) hours as needed.     amLODipine (NORVASC) 10 MG tablet TAKE 1 TABLET BY MOUTH EVERY DAY FOR BLOOD PRESSURE 90 tablet 1   lamoTRIgine (LAMICTAL) 100 MG tablet Take 1 tablet (100 mg total) by mouth 2 (two) times daily. 180 tablet 4   [DISCONTINUED] ferrous sulfate 325 (65 FE) MG tablet Take 1 tablet (325 mg total) by mouth daily. 30 tablet 0   [DISCONTINUED] levETIRAcetam (KEPPRA) 500 MG tablet Take 1 tablet (500 mg total) by mouth daily. 14 tablet 0   No current facility-administered medications on file prior to visit.    BP 121/77   Pulse 76   Temp 97.8 F (36.6 C) (Temporal)   Ht 5\' 6"  (1.676 m)   Wt 152 lb (68.9 kg)   SpO2 98%   BMI 24.53 kg/m  Objective:   Physical Exam Cardiovascular:     Rate and Rhythm: Regular rhythm. Tachycardia present.  Pulmonary:     Effort: Pulmonary effort is normal.     Breath sounds: Normal breath sounds.  Musculoskeletal:     Cervical back: Neck supple.  Skin:    General: Skin is warm and dry.  Psychiatric:        Mood and Affect: Mood normal.          Assessment & Plan:      This visit occurred during the SARS-CoV-2 public health emergency.  Safety protocols were in place, including screening questions prior to the visit, additional usage of staff PPE, and extensive cleaning of exam room while observing appropriate contact time as indicated for disinfecting solutions.

## 2020-09-13 NOTE — Assessment & Plan Note (Signed)
Denies depression but anxiety is uncontrolled.  We will wean off Bupropion (prescribed by GYN) as this lowers seizure threshold. Instructions provided.  Start Lexapro 10 mg. Patient is to take 1/2 tablet daily for 8 days, then advance to 1 full tablet thereafter. We discussed possible side effects of headache, GI upset, drowsiness, and SI/HI. If thoughts of SI/HI develop, we discussed to present to the emergency immediately. Patient verbalized understanding.   She will update in 4 weeks.

## 2020-09-13 NOTE — Assessment & Plan Note (Signed)
Chronic for years, has been stable.  MCV is high which suggests B12 deficiency.  Checking B12 and iron studies today.   No vaginal or rectal bleeding. Consider hematology evaluation.  She is a smoker.

## 2020-09-13 NOTE — Telephone Encounter (Signed)
Agree. Patient has not seen me in over one year despite recommendations to come in. Will evaluate.

## 2020-09-13 NOTE — Patient Instructions (Signed)
Wean off of your bupropion (Wellbutrin) for depression. Reduce to 1 tablet once daily for one week, then 1 tablet every other day for 10 days, then stop.  We initiated escitalopram (Lexapro) 10 mg daily for anxiety and depression. Start by taking 1/2 tablet daily for about one week, then increase to 1 full tablet thereafter.  Stop by the lab prior to leaving today. I will notify you of your results once received.   It was a pleasure to see you today!

## 2020-09-13 NOTE — Assessment & Plan Note (Signed)
Recent potassium within normal range. Continue to monitor.

## 2020-09-13 NOTE — Addendum Note (Signed)
Addended by: Alvina Chou on: 09/13/2020 03:11 PM   Modules accepted: Orders

## 2020-09-13 NOTE — Assessment & Plan Note (Signed)
Recent A1C within normal range. °Continue to monitor.  °

## 2020-09-13 NOTE — Assessment & Plan Note (Signed)
Following with neurology, continue Lamictal 100 mg BID. Patient pending MRI and EEG. Recent labs reviewed.

## 2020-09-14 ENCOUNTER — Ambulatory Visit: Payer: 59 | Admitting: Family Medicine

## 2020-09-14 ENCOUNTER — Ambulatory Visit: Payer: 59 | Admitting: Diagnostic Neuroimaging

## 2020-09-14 DIAGNOSIS — G40909 Epilepsy, unspecified, not intractable, without status epilepticus: Secondary | ICD-10-CM | POA: Diagnosis not present

## 2020-09-14 LAB — VITAMIN B12: Vitamin B-12: 87 pg/mL — ABNORMAL LOW (ref 211–911)

## 2020-09-14 LAB — IBC + FERRITIN
Ferritin: 39 ng/mL (ref 10.0–291.0)
Iron: 306 ug/dL — ABNORMAL HIGH (ref 42–145)
Saturation Ratios: 61.2 % — ABNORMAL HIGH (ref 20.0–50.0)
Transferrin: 357 mg/dL (ref 212.0–360.0)

## 2020-09-14 LAB — FOLATE: Folate: 2.8 ng/mL — ABNORMAL LOW (ref >5.9)

## 2020-09-15 ENCOUNTER — Other Ambulatory Visit: Payer: Self-pay | Admitting: Primary Care

## 2020-09-15 ENCOUNTER — Other Ambulatory Visit (HOSPITAL_COMMUNITY): Payer: Self-pay

## 2020-09-15 DIAGNOSIS — E538 Deficiency of other specified B group vitamins: Secondary | ICD-10-CM | POA: Insufficient documentation

## 2020-09-15 MED ORDER — CYANOCOBALAMIN 1000 MCG/ML IJ SOLN
INTRAMUSCULAR | 0 refills | Status: DC
  Filled 2020-09-15: qty 7, 56d supply, fill #0

## 2020-09-15 MED ORDER — "SYRINGE/NEEDLE (DISP) 23G X 1"" 1 ML MISC"
0 refills | Status: DC
  Filled 2020-09-15: qty 7, fill #0

## 2020-09-15 NOTE — Assessment & Plan Note (Signed)
Recent level very low, elevated MCV, chronically lower hemoglobin.  Will initiate B12 1000 mcg injections once weekly x 4 weeks, then every other week x 4 weeks, then once monthly.  Repeat B12 lab in 3 months.

## 2020-09-16 ENCOUNTER — Ambulatory Visit: Payer: 59 | Admitting: Primary Care

## 2020-09-16 LAB — VITAMIN B1: Vitamin B1 (Thiamine): 12 nmol/L (ref 8–30)

## 2020-09-16 LAB — CBC
HCT: 33.8 % — ABNORMAL LOW (ref 35.0–45.0)
Hemoglobin: 11.6 g/dL — ABNORMAL LOW (ref 11.7–15.5)
MCH: 36 pg — ABNORMAL HIGH (ref 27.0–33.0)
MCHC: 34.3 g/dL (ref 32.0–36.0)
MCV: 105 fL — ABNORMAL HIGH (ref 80.0–100.0)
MPV: 9.2 fL (ref 7.5–12.5)
Platelets: 409 10*3/uL — ABNORMAL HIGH (ref 140–400)
RBC: 3.22 10*6/uL — ABNORMAL LOW (ref 3.80–5.10)
RDW: 16.2 % — ABNORMAL HIGH (ref 11.0–15.0)
WBC: 5.1 10*3/uL (ref 3.8–10.8)

## 2020-09-16 LAB — HEPATITIS C ANTIBODY
Hepatitis C Ab: NONREACTIVE
SIGNAL TO CUT-OFF: 0.01 (ref <1.00)

## 2020-09-16 LAB — PATHOLOGIST SMEAR REVIEW

## 2020-09-21 NOTE — Procedures (Signed)
   [facility]  EEG (ELECTROENCEPHALOGRAM) REPORT   STUDY DATE: 09/14/20 PATIENT NAME: Melissa Chapman DOB: 1986/04/01 MRN: [id number]  ORDERING CLINICIAN: Joycelyn Schmid, MD   TECHNOLOGIST: Ardis Hughs  TECHNIQUE: Electroencephalogram was recorded utilizing standard 10-20 system of lead placement and reformatted into average and bipolar montages.  RECORDING TIME: 29 minutes  ACTIVATION: hyperventilation and photic stimulation  CLINICAL INFORMATION: 35 year old female with seizures.  FINDINGS: Posterior dominant background rhythms, which attenuate with eye opening, ranging 11-12 hertz and 15-20 microvolts. No focal, lateralizing, epileptiform activity or seizures are seen. Patient recorded in the awake and drowsy state. EKG channel shows regular rhythm of 90-100 beats per minute.   IMPRESSION:   Normal EEG in the awake and drowsy states.     INTERPRETING PHYSICIAN:  Suanne Marker, MD Certified in Neurology, Neurophysiology and Neuroimaging  [facility] [address], [address] [place], [place] [address] [phone]

## 2020-10-05 ENCOUNTER — Ambulatory Visit: Payer: Self-pay | Admitting: Family Medicine

## 2020-10-12 ENCOUNTER — Other Ambulatory Visit: Payer: Self-pay | Admitting: Primary Care

## 2020-10-12 ENCOUNTER — Other Ambulatory Visit (HOSPITAL_COMMUNITY): Payer: Self-pay

## 2020-10-12 DIAGNOSIS — F32A Depression, unspecified: Secondary | ICD-10-CM

## 2020-10-12 DIAGNOSIS — F419 Anxiety disorder, unspecified: Secondary | ICD-10-CM

## 2020-10-12 MED ORDER — CARESTART COVID-19 HOME TEST VI KIT
PACK | 0 refills | Status: DC
Start: 2020-10-12 — End: 2021-07-18
  Filled 2020-10-12: qty 4, 4d supply, fill #0

## 2020-10-12 NOTE — Telephone Encounter (Signed)
How she doing since we initiated the Lexapro 10 mg daily for anxiety?  If improving then okay to send in refill as pended.

## 2020-10-13 ENCOUNTER — Other Ambulatory Visit: Payer: 59

## 2020-10-13 NOTE — Telephone Encounter (Signed)
Called patient taking daily as prescribed. Would like to continue at this dose. I have sent in as pended.

## 2020-10-21 ENCOUNTER — Other Ambulatory Visit (HOSPITAL_COMMUNITY): Payer: Self-pay

## 2020-12-03 ENCOUNTER — Other Ambulatory Visit: Payer: Self-pay | Admitting: Primary Care

## 2020-12-03 DIAGNOSIS — E538 Deficiency of other specified B group vitamins: Secondary | ICD-10-CM

## 2020-12-09 ENCOUNTER — Other Ambulatory Visit (HOSPITAL_COMMUNITY): Payer: Self-pay

## 2020-12-12 NOTE — Telephone Encounter (Signed)
Is it ok to do virtual? If so I will call her and set up something. Also wanted to make sure you see the rest of the message.

## 2020-12-13 ENCOUNTER — Other Ambulatory Visit: Payer: 59

## 2020-12-13 NOTE — Telephone Encounter (Signed)
Virtual is fine but she also needs labs. Can we set up a virtual for this week and the lab appt for the day she's off?

## 2021-03-13 ENCOUNTER — Encounter: Payer: Self-pay | Admitting: Diagnostic Neuroimaging

## 2021-03-13 ENCOUNTER — Ambulatory Visit: Payer: 59 | Admitting: Diagnostic Neuroimaging

## 2021-03-16 ENCOUNTER — Ambulatory Visit: Payer: 59 | Admitting: Primary Care

## 2021-04-05 ENCOUNTER — Other Ambulatory Visit: Payer: Self-pay | Admitting: Primary Care

## 2021-04-05 DIAGNOSIS — I10 Essential (primary) hypertension: Secondary | ICD-10-CM

## 2021-04-05 DIAGNOSIS — F32A Depression, unspecified: Secondary | ICD-10-CM

## 2021-04-05 DIAGNOSIS — F419 Anxiety disorder, unspecified: Secondary | ICD-10-CM

## 2021-04-06 ENCOUNTER — Other Ambulatory Visit: Payer: Self-pay | Admitting: Diagnostic Neuroimaging

## 2021-06-20 ENCOUNTER — Other Ambulatory Visit: Payer: Self-pay

## 2021-06-20 ENCOUNTER — Encounter: Payer: Self-pay | Admitting: Family Medicine

## 2021-06-20 ENCOUNTER — Ambulatory Visit (INDEPENDENT_AMBULATORY_CARE_PROVIDER_SITE_OTHER): Payer: 59 | Admitting: Family Medicine

## 2021-06-20 VITALS — BP 150/100 | HR 125 | Temp 100.8°F | Resp 14 | Ht 66.0 in | Wt 149.2 lb

## 2021-06-20 DIAGNOSIS — A084 Viral intestinal infection, unspecified: Secondary | ICD-10-CM

## 2021-06-20 DIAGNOSIS — R000 Tachycardia, unspecified: Secondary | ICD-10-CM

## 2021-06-20 HISTORY — DX: Viral intestinal infection, unspecified: A08.4

## 2021-06-20 NOTE — Assessment & Plan Note (Signed)
Chronic, worsened ? ?Her baseline heart rate per patient is usually on 100-115.  She is likely mildly dehydrated... Encouraged fluids. ?

## 2021-06-20 NOTE — Assessment & Plan Note (Signed)
Acute, improving ? ?Encouraged hydration and slow advance of diet.  She denies any need for antiemetic. ?

## 2021-06-20 NOTE — Progress Notes (Signed)
? ? Patient ID: Melissa Chapman, female    DOB: 02/14/1986, 36 y.o.   MRN: [id number] ? ?This visit was conducted in person. ? ?Pulse (!) 125   Temp (!) 100.8 ?F (38.2 ?C) (Oral)   Resp 14   Ht $R'5\' 6"'FF$  (1.676 m)   Wt 149 lb 3.2 oz (67.7 kg)   SpO2 98%   BMI 24.08 kg/m?   ? ?CC:  ?Chief Complaint  ?Patient presents with  ? Acute Visit  ?  Needs work note to return to work was having nausea over the weekend has some yesterday but feels much better today. Denies any fever,chills,bodyaches. Last seen by Gentry Fitz on 09/13/20 ?  ? ? ?Subjective:  ? ?HPI: ?Melissa Chapman is a 36 y.o. female presenting on 06/20/2021 for Acute Visit (Needs work note to return to work was having nausea over the weekend has some yesterday but feels much better today. Denies any fever,chills,bodyaches. Last seen by Gentry Fitz on 09/13/20/) ? ?PCP Allie Bossier ? ? Hx of HTN, tachycardia, seizure disorder ? ?She reports nausea in the last few days ( 3/18) started after eating at buffet on 3/18. ? Had emesis and nausea. ? She has been using pepto-bismol. ? No diarrhea, no abd pain. No fever. ?Small amount of emesis this AM.. starting to feel better. ? Able to keep down liquids. Today Pedialyte and gingerale.. 12-20 oz. ? ? ?Blood pressure is elevated today... she has not taken any BP meds in last 3 days. ? ?BP Readings from Last 3 Encounters:  ?06/20/21 (!) 150/100  ?09/13/20 121/77  ?09/05/20 121/77  ? ? ? ? ?   ? ?Relevant past medical, surgical, family and social history reviewed and updated as indicated. Interim medical history since our last visit reviewed. ?Allergies and medications reviewed and updated. ?Outpatient Medications Prior to Visit  ?Medication Sig Dispense Refill  ? acetaminophen (TYLENOL) 325 MG tablet Take 650 mg by mouth every 6 (six) hours as needed.    ? amLODipine (NORVASC) 10 MG tablet TAKE 1 TABLET BY MOUTH EVERY DAY FOR BLOOD PRESSURE 90 tablet 0  ? escitalopram (LEXAPRO) 10 MG tablet TAKE 1 TABLET (10 MG TOTAL) BY MOUTH  DAILY. FOR ANXIETY. 90 tablet 1  ? lamoTRIgine (LAMICTAL) 100 MG tablet TAKE 1 TABLET BY MOUTH TWICE A DAY 180 tablet 0  ? COVID-19 At Home Antigen Test [facility] COVID-19 HOME TEST) KIT Use as directed within package instructions. (Patient not taking: Reported on 06/20/2021) 4 each 0  ? cyanocobalamin (,VITAMIN B-12,) 1000 MCG/ML injection Inject 1 ml into the muscle once weekly for 4 weeks, then every other week for 4 weeks, then once monthly thereafter. (Patient not taking: Reported on 06/20/2021) 7 mL 0  ? SYRINGE/NEEDLE, DISP, 1 ML 23G X 1" 1 ML MISC Use as directed (Patient not taking: Reported on 06/20/2021) 7 each 0  ? ?No facility-administered medications prior to visit.  ?  ? ?Per HPI unless specifically indicated in ROS section below ?Review of Systems  ?Constitutional:  Positive for fatigue. Negative for fever.  ?HENT:  Negative for congestion.   ?Eyes:  Negative for pain.  ?Respiratory:  Negative for cough and shortness of breath.   ?Cardiovascular:  Negative for chest pain, palpitations and leg swelling.  ?Gastrointestinal:  Negative for abdominal pain.  ?Genitourinary:  Negative for dysuria and vaginal bleeding.  ?Musculoskeletal:  Negative for back pain.  ?Neurological:  Negative for syncope, light-headedness and headaches.  ?Psychiatric/Behavioral:  Negative for dysphoric mood.   ?Objective:  ?  Pulse (!) 125   Temp (!) 100.8 ?F (38.2 ?C) (Oral)   Resp 14   Ht $R'5\' 6"'nx$  (1.676 m)   Wt 149 lb 3.2 oz (67.7 kg)   SpO2 98%   BMI 24.08 kg/m?   ?Wt Readings from Last 3 Encounters:  ?06/20/21 149 lb 3.2 oz (67.7 kg)  ?09/13/20 152 lb (68.9 kg)  ?09/05/20 155 lb (70.3 kg)  ?  ?  ?Physical Exam ?Constitutional:   ?   General: She is not in acute distress. ?   Appearance: Normal appearance. She is well-developed. She is not ill-appearing or toxic-appearing.  ?HENT:  ?   Head: Normocephalic.  ?   Right Ear: Hearing, tympanic membrane, ear canal and external ear normal. Tympanic membrane is not erythematous,  retracted or bulging.  ?   Left Ear: Hearing, tympanic membrane, ear canal and external ear normal. Tympanic membrane is not erythematous, retracted or bulging.  ?   Nose: No mucosal edema or rhinorrhea.  ?   Right Sinus: No maxillary sinus tenderness or frontal sinus tenderness.  ?   Left Sinus: No maxillary sinus tenderness or frontal sinus tenderness.  ?   Mouth/Throat:  ?   Pharynx: Uvula midline.  ?Eyes:  ?   General: Lids are normal. Lids are everted, no foreign bodies appreciated.  ?   Conjunctiva/sclera: Conjunctivae normal.  ?   Pupils: Pupils are equal, round, and reactive to light.  ?Neck:  ?   Thyroid: No thyroid mass or thyromegaly.  ?   Vascular: No carotid bruit.  ?   Trachea: Trachea normal.  ?Cardiovascular:  ?   Rate and Rhythm: Regular rhythm. Tachycardia present.  ?   Pulses: Normal pulses.  ?   Heart sounds: Normal heart sounds, S1 normal and S2 normal. No murmur heard. ?  No friction rub. No gallop.  ?Pulmonary:  ?   Effort: Pulmonary effort is normal. No tachypnea or respiratory distress.  ?   Breath sounds: Normal breath sounds. No decreased breath sounds, wheezing, rhonchi or rales.  ?Abdominal:  ?   General: Bowel sounds are normal.  ?   Palpations: Abdomen is soft.  ?   Tenderness: There is no abdominal tenderness.  ?Musculoskeletal:  ?   Cervical back: Normal range of motion and neck supple.  ?   Right lower leg: No edema.  ?   Left lower leg: No edema.  ?Skin: ?   General: Skin is warm and dry.  ?   Findings: No rash.  ?Neurological:  ?   Mental Status: She is alert.  ?   Motor: Tremor present.  ?   Comments: She is shaky and jittery.. states this is not unusual  ?Psychiatric:     ?   Mood and Affect: Mood is not anxious or depressed.     ?   Speech: Speech normal.     ?   Behavior: Behavior normal. Behavior is cooperative.     ?   Thought Content: Thought content normal.     ?   Judgment: Judgment normal.  ? ?   ?Results for orders placed or performed in visit on 09/13/20  ?Hepatitis C  antibody  ?Result Value Ref Range  ? Hepatitis C Ab NON-REACTIVE NON-REACTIVE  ? SIGNAL TO CUT-OFF 0.01 <1.00  ?IBC + Ferritin  ?Result Value Ref Range  ? Iron 306 (H) 42 - 145 ug/dL  ? Transferrin 357.0 212.0 - 360.0 mg/dL  ? Saturation Ratios 61.2 (H) 20.0 - 50.0 %  ?  Ferritin 39.0 10.0 - 291.0 ng/mL  ?Vitamin B12  ?Result Value Ref Range  ? Vitamin B-12 87 (L) 211 - 911 pg/mL  ?Folate  ?Result Value Ref Range  ? Folate 2.8 (L) >5.9 ng/mL  ?Pathologist smear review  ?Result Value Ref Range  ? Path Review    ?Vitamin B1  ?Result Value Ref Range  ? Vitamin B1 (Thiamine) 12 8 - 30 nmol/L  ?CBC  ?Result Value Ref Range  ? WBC 5.1 3.8 - 10.8 Thousand/uL  ? RBC 3.22 (L) 3.80 - 5.10 Million/uL  ? Hemoglobin 11.6 (L) 11.7 - 15.5 g/dL  ? HCT 33.8 (L) 35.0 - 45.0 %  ? MCV 105.0 (H) 80.0 - 100.0 fL  ? MCH 36.0 (H) 27.0 - 33.0 pg  ? MCHC 34.3 32.0 - 36.0 g/dL  ? RDW 16.2 (H) 11.0 - 15.0 %  ? Platelets 409 (H) 140 - 400 Thousand/uL  ? MPV 9.2 7.5 - 12.5 fL  ? ? ?This visit occurred during the SARS-CoV-2 public health emergency.  Safety protocols were in place, including screening questions prior to the visit, additional usage of staff PPE, and extensive cleaning of exam room while observing appropriate contact time as indicated for disinfecting solutions.  ? ?COVID 19 screen:  No recent travel or known exposure to [place] ?The patient denies respiratory symptoms of COVID 19 at this time. ?The importance of social distancing was discussed today.  ? ?Assessment and Plan ? ?  ?Problem List Items Addressed This Visit   ? ? Tachycardia  ?  Chronic, worsened ? ?Her baseline heart rate per patient is usually on 100-115.  She is likely mildly dehydrated... Encouraged fluids. ?  ?  ? Viral gastroenteritis - Primary  ?  Acute, improving ? ?Encouraged hydration and slow advance of diet.  She denies any need for antiemetic. ?  ?  ? ? ? ?Eliezer Lofts, MD  ? ?

## 2021-06-20 NOTE — Patient Instructions (Signed)
Gradually increase fluid as tolerated. ? Rest and advance diet as able. ? Take BP med as soon as able with food. ?

## 2021-06-20 NOTE — Telephone Encounter (Signed)
Hi Markel, were you seen for Jae Dire about this? I did not see a note?  ?

## 2021-07-07 ENCOUNTER — Other Ambulatory Visit: Payer: Self-pay | Admitting: Diagnostic Neuroimaging

## 2021-07-10 ENCOUNTER — Other Ambulatory Visit: Payer: Self-pay | Admitting: Primary Care

## 2021-07-10 DIAGNOSIS — I10 Essential (primary) hypertension: Secondary | ICD-10-CM

## 2021-07-10 MED ORDER — AMLODIPINE BESYLATE 10 MG PO TABS: 10.0000 mg | ORAL_TABLET | Freq: Every day | ORAL | 0 refills | Status: DC

## 2021-07-10 MED ORDER — LAMOTRIGINE 100 MG PO TABS: 100.0000 mg | ORAL_TABLET | Freq: Two times a day (BID) | ORAL | 0 refills | Status: DC

## 2021-07-17 DIAGNOSIS — I10 Essential (primary) hypertension: Secondary | ICD-10-CM

## 2021-07-18 ENCOUNTER — Encounter: Payer: Self-pay | Admitting: Diagnostic Neuroimaging

## 2021-07-18 MED ORDER — LAMOTRIGINE 100 MG PO TABS: 100.0000 mg | ORAL_TABLET | Freq: Two times a day (BID) | ORAL | 0 refills | Status: DC

## 2021-07-18 MED ORDER — AMLODIPINE BESYLATE 10 MG PO TABS: 10.0000 mg | ORAL_TABLET | Freq: Every day | ORAL | 0 refills | Status: DC

## 2021-07-18 NOTE — Telephone Encounter (Signed)
I have changed the pharmacy and will forward to Jae Dire so she knows pt has set up and appt. ?

## 2021-07-20 DIAGNOSIS — I10 Essential (primary) hypertension: Secondary | ICD-10-CM

## 2021-07-20 DIAGNOSIS — F419 Anxiety disorder, unspecified: Secondary | ICD-10-CM

## 2021-07-21 MED ORDER — AMLODIPINE BESYLATE 10 MG PO TABS: 10.0000 mg | ORAL_TABLET | Freq: Every day | ORAL | 0 refills | Status: DC

## 2021-07-21 MED ORDER — ESCITALOPRAM OXALATE 10 MG PO TABS: 10.0000 mg | ORAL_TABLET | Freq: Every day | ORAL | 0 refills | Status: DC

## 2021-08-03 ENCOUNTER — Telehealth: Payer: Self-pay

## 2021-08-03 ENCOUNTER — Encounter: Payer: 59 | Admitting: Primary Care

## 2021-08-03 NOTE — Telephone Encounter (Signed)
Tried to call patient to re schedule her appt. ?

## 2021-08-07 NOTE — Telephone Encounter (Signed)
Called patient appointment made no further action needed at this time.  ?

## 2021-08-08 ENCOUNTER — Encounter: Payer: Self-pay | Admitting: Primary Care

## 2021-08-08 ENCOUNTER — Ambulatory Visit (INDEPENDENT_AMBULATORY_CARE_PROVIDER_SITE_OTHER): Payer: 59 | Admitting: Primary Care

## 2021-08-08 VITALS — BP 140/82 | HR 115 | Temp 98.6°F | Ht 66.0 in | Wt 148.0 lb

## 2021-08-08 DIAGNOSIS — F32A Depression, unspecified: Secondary | ICD-10-CM

## 2021-08-08 DIAGNOSIS — E538 Deficiency of other specified B group vitamins: Secondary | ICD-10-CM

## 2021-08-08 DIAGNOSIS — Z0000 Encounter for general adult medical examination without abnormal findings: Secondary | ICD-10-CM | POA: Diagnosis not present

## 2021-08-08 DIAGNOSIS — I10 Essential (primary) hypertension: Secondary | ICD-10-CM

## 2021-08-08 DIAGNOSIS — M25562 Pain in left knee: Secondary | ICD-10-CM

## 2021-08-08 DIAGNOSIS — E781 Pure hyperglyceridemia: Secondary | ICD-10-CM | POA: Diagnosis not present

## 2021-08-08 DIAGNOSIS — G43909 Migraine, unspecified, not intractable, without status migrainosus: Secondary | ICD-10-CM

## 2021-08-08 DIAGNOSIS — G8929 Other chronic pain: Secondary | ICD-10-CM

## 2021-08-08 DIAGNOSIS — R7303 Prediabetes: Secondary | ICD-10-CM | POA: Diagnosis not present

## 2021-08-08 DIAGNOSIS — G40909 Epilepsy, unspecified, not intractable, without status epilepticus: Secondary | ICD-10-CM

## 2021-08-08 DIAGNOSIS — R000 Tachycardia, unspecified: Secondary | ICD-10-CM

## 2021-08-08 DIAGNOSIS — F419 Anxiety disorder, unspecified: Secondary | ICD-10-CM

## 2021-08-08 LAB — LIPID PANEL
Cholesterol: 138 mg/dL (ref 0–200)
HDL: 61.7 mg/dL (ref >39.00)
LDL Cholesterol: 58 mg/dL (ref 0–99)
NonHDL: 76.26
Total CHOL/HDL Ratio: 2
Triglycerides: 93 mg/dL (ref 0.0–149.0)
VLDL: 18.6 mg/dL (ref 0.0–40.0)

## 2021-08-08 LAB — FOLATE: Folate: 2.7 ng/mL — ABNORMAL LOW (ref >5.9)

## 2021-08-08 LAB — COMPREHENSIVE METABOLIC PANEL
ALT: 33 U/L (ref 0–35)
AST: 118 U/L — ABNORMAL HIGH (ref 0–37)
Albumin: 3.8 g/dL (ref 3.5–5.2)
Alkaline Phosphatase: 213 U/L — ABNORMAL HIGH (ref 39–117)
BUN: 8 mg/dL (ref 6–23)
CO2: 25 mEq/L (ref 19–32)
Calcium: 8.5 mg/dL (ref 8.4–10.5)
Chloride: 104 mEq/L (ref 96–112)
Creatinine, Ser: 0.66 mg/dL (ref 0.40–1.20)
GFR: 112.94 mL/min (ref >60.00)
Glucose, Bld: 136 mg/dL — ABNORMAL HIGH (ref 70–99)
Potassium: 3.1 mEq/L — ABNORMAL LOW (ref 3.5–5.1)
Sodium: 143 mEq/L (ref 135–145)
Total Bilirubin: 0.7 mg/dL (ref 0.2–1.2)
Total Protein: 7.9 g/dL (ref 6.0–8.3)

## 2021-08-08 LAB — HEMOGLOBIN A1C: Hgb A1c MFr Bld: 5.4 % (ref 4.6–6.5)

## 2021-08-08 LAB — CBC
HCT: 32 % — ABNORMAL LOW (ref 36.0–46.0)
Hemoglobin: 10.7 g/dL — ABNORMAL LOW (ref 12.0–15.0)
MCHC: 33.4 g/dL (ref 30.0–36.0)
MCV: 116.6 fl — ABNORMAL HIGH (ref 78.0–100.0)
Platelets: 219 10*3/uL (ref 150.0–400.0)
RBC: 2.74 Mil/uL — ABNORMAL LOW (ref 3.87–5.11)
RDW: 19.6 % — ABNORMAL HIGH (ref 11.5–15.5)
WBC: 6 10*3/uL (ref 4.0–10.5)

## 2021-08-08 LAB — TSH: TSH: 1.41 u[IU]/mL (ref 0.35–5.50)

## 2021-08-08 LAB — VITAMIN B12: Vitamin B-12: 1271 pg/mL — ABNORMAL HIGH (ref 211–911)

## 2021-08-08 NOTE — Assessment & Plan Note (Signed)
Controlled.  Continue Lexapro 10 mg daily. 

## 2021-08-08 NOTE — Progress Notes (Signed)
? ?Subjective:  ? ? Patient ID: Melissa Chapman, female    DOB: 21-May-1985, 36 y.o.   MRN: [id number] ? ?HPI ? ?Melissa Chapman is a very pleasant 36 y.o. female who presents today for complete physical and follow up of chronic conditions. ? ?She would also like to mention chronic left knee pain. History of torn meniscus several years ago. Symptoms of knee pain wax and wane with "knee locking" sensation with significant pain. Her last episode occurred yesterday while at work while walking. She's recently changed jobs 1 month ago and is more active. She denies swelling, erythema, injury/trauma. She's never undergone surgery, has had 2 injections several years ago. She's taken Tylenol with temporary improvement. She's also used Voltaren Gel without improvement. Her employer is requesting a note stating that she can continue to work given her knee pain.  She would like to continue working her usual schedule without restrictions. ? ?She would also like mention right upper eye lid swelling. Acute for the last 3 weeks without pain, eye pain, eye drainage.  She's not tried warm compresses.  She sees an eye doctor annually. ? ?Immunizations: ?-Tetanus: 2021 ?-Influenza: Completed last season  ?-Covid-19: 1 vaccine  ? ?Diet: Fair diet.  ?Exercise: No regular exercise. ? ?Eye exam: Completes annually  ?Dental exam: Completed last year  ? ?Pap Smear: Completed per GYN ? ? ?BP Readings from Last 3 Encounters:  ?08/08/21 140/82  ?06/20/21 (!) 150/100  ?09/13/20 121/77  ? ? ? ? ?Review of Systems  ?Constitutional:  Negative for unexpected weight change.  ?HENT:  Negative for rhinorrhea.   ?Eyes:  Negative for visual disturbance.  ?     Right upper eye lid swelling  ?Respiratory:  Negative for cough and shortness of breath.   ?Cardiovascular:  Negative for chest pain.  ?Gastrointestinal:  Negative for constipation and diarrhea.  ?Genitourinary:  Negative for difficulty urinating and menstrual problem.  ?Musculoskeletal:  Positive  for arthralgias.  ?Skin:  Negative for rash.  ?Allergic/Immunologic: Negative for environmental allergies.  ?Neurological:  Negative for dizziness and headaches.  ?Psychiatric/Behavioral:  The patient is not nervous/anxious.   ? ?   ? ? ?Past Medical History:  ?Diagnosis Date  ? Anxiety and depression   ? Chronic hypokalemia   ? Essential hypertension   ? Pancreatitis   ? Seizures (HCC)   ? last seizure 09/02/20  ? Syncope   ? most recent March 2021  ? Viral gastroenteritis 06/20/2021  ? ? ?Social History  ? ?Socioeconomic History  ? Marital status: Single  ?  Spouse name: Not on file  ? Number of children: 1  ? Years of education: Not on file  ? Highest education level: Bachelor's degree (e.g., BA, AB, BS)  ?Occupational History  ?  Comment: RN [employer]  ?Tobacco Use  ? Smoking status: Every Day  ?  Packs/day: 0.25  ?  Types: Cigarettes  ? Smokeless tobacco: Never  ? Tobacco comments:  ?  04/05/20 "a little more"  ?Substance and Sexual Activity  ? Alcohol use: Yes  ?  Comment: socially  ? Drug use: No  ? Sexual activity: Yes  ?Other Topics Concern  ? Not on file  ?Social History Narrative  ? Single.   ? 1 son.  ? Works as a Engineer, civil (consulting) in [employer].   ? Enjoys watching movies, swimming.  ? ?Social Determinants of Health  ? ?Financial Resource Strain: Not on file  ?Food Insecurity: Not on file  ?Transportation Needs: Not on file  ?  Physical Activity: Not on file  ?Stress: Not on file  ?Social Connections: Not on file  ?Intimate Partner Violence: Not on file  ? ? ?No past surgical history on file. ? ?Family History  ?Problem Relation Age of Onset  ? Depression Mother   ? Hypertension Mother   ? Hyperlipidemia Mother   ? Hypertension Father   ? Diabetes Father   ? Hyperlipidemia Father   ? Depression Sister   ? Cancer Paternal Grandmother   ?     Sinus   ? ? ?No Known Allergies ? ?Current Outpatient Medications on File Prior to Visit  ?Medication Sig Dispense Refill  ? amLODipine (NORVASC) 10 MG tablet Take 1 tablet (10 mg total)  by mouth daily. for blood pressure. Office visit required for further refills. 90 tablet 0  ? cyanocobalamin 1000 MCG tablet Take 1,000 mcg by mouth daily.    ? escitalopram (LEXAPRO) 10 MG tablet Take 1 tablet (10 mg total) by mouth daily. For anxiety. Office visit required for further refills. 90 tablet 0  ? lamoTRIgine (LAMICTAL) 100 MG tablet Take 1 tablet (100 mg total) by mouth 2 (two) times daily. 180 tablet 0  ? medroxyPROGESTERone (DEPO-PROVERA) 150 MG/ML injection ADMINISTER 1 ML IN THE MUSCLE EVERY 11 TO 12 WEEKS    ? [DISCONTINUED] ferrous sulfate 325 (65 FE) MG tablet Take 1 tablet (325 mg total) by mouth daily. 30 tablet 0  ? [DISCONTINUED] levETIRAcetam (KEPPRA) 500 MG tablet Take 1 tablet (500 mg total) by mouth daily. 14 tablet 0  ? ?No current facility-administered medications on file prior to visit.  ? ? ?BP 140/82   Pulse (!) 115   Temp 98.6 ?F (37 ?C) (Oral)   Ht 5\' 6"  (1.676 m)   Wt 148 lb (67.1 kg)   SpO2 95%   BMI 23.89 kg/m?  ?Objective:  ? Physical Exam ?Constitutional:   ?   Comments: Slight body tremor. Patient endorses she is cold.   ?HENT:  ?   Right Ear: Tympanic membrane and ear canal normal.  ?   Left Ear: Tympanic membrane and ear canal normal.  ?   Nose: Nose normal.  ?Eyes:  ?   General:     ?   Right eye: Hordeolum present.  ?   Extraocular Movements: Extraocular movements intact.  ?   Conjunctiva/sclera: Conjunctivae normal.  ?   Pupils: Pupils are equal, round, and reactive to light.  ?Neck:  ?   Thyroid: No thyromegaly.  ?Cardiovascular:  ?   Rate and Rhythm: Normal rate and regular rhythm.  ?   Heart sounds: No murmur heard. ?Pulmonary:  ?   Effort: Pulmonary effort is normal.  ?   Breath sounds: Normal breath sounds. No rales.  ?Abdominal:  ?   General: Bowel sounds are normal.  ?   Palpations: Abdomen is soft.  ?   Tenderness: There is no abdominal tenderness.  ?Musculoskeletal:     ?   General: Normal range of motion.  ?   Cervical back: Neck supple.   ?Lymphadenopathy:  ?   Cervical: No cervical adenopathy.  ?Skin: ?   General: Skin is warm and dry.  ?   Findings: No rash.  ?Neurological:  ?   Mental Status: She is alert and oriented to person, place, and time.  ?   Cranial Nerves: No cranial nerve deficit.  ?   Deep Tendon Reflexes: Reflexes are normal and symmetric.  ?Psychiatric:     ?  Mood and Affect: Mood normal.  ? ? ? ? ? ?   ?Assessment & Plan:  ? ? ? ? ?This visit occurred during the SARS-CoV-2 public health emergency.  Safety protocols were in place, including screening questions prior to the visit, additional usage of staff PPE, and extensive cleaning of exam room while observing appropriate contact time as indicated for disinfecting solutions.  ?

## 2021-08-08 NOTE — Assessment & Plan Note (Signed)
Repeat lipid panel pending. 

## 2021-08-08 NOTE — Assessment & Plan Note (Signed)
Repeat A1c pending. 

## 2021-08-08 NOTE — Assessment & Plan Note (Signed)
Slightly elevated today, she does appear disturbed on exam. Slight body shakes, although patient endorses she is cold. ? ?Checking labs today. ?Continue amlodipine 10 mg daily. ?

## 2021-08-08 NOTE — Assessment & Plan Note (Signed)
Chronic history for years. ?Never evaluated by cardiology. ? ?Checking labs today to rule out other causes, but will likely refer to cardiology pending results.  She agrees. ?

## 2021-08-08 NOTE — Assessment & Plan Note (Signed)
Compliant to vitamin B12 1000 mcg daily. Repeat B12 level pending. 

## 2021-08-08 NOTE — Assessment & Plan Note (Signed)
Chronic and intermittent for years, current flare now. ? ?We discussed knee bracing, OTC treatment.  She will also schedule an appointment to meet with our sports medicine doctor. ? ?She feels very comfortable working her normal schedule.  Work note provided today. ?

## 2021-08-08 NOTE — Assessment & Plan Note (Signed)
Controlled.  No recent migraine. Continue to monitor.  

## 2021-08-08 NOTE — Assessment & Plan Note (Signed)
Immunizations up-to-date. ?Pap smear up-to-date, follows with GYN ? ?Encouraged healthy diet regular exercise. ? ?Exam today as noted, labs pending. ?

## 2021-08-08 NOTE — Assessment & Plan Note (Signed)
No recent seizures. ? ?Continue Lamictal 100 mg BID. ?Follows with Neurology ?

## 2021-08-08 NOTE — Patient Instructions (Addendum)
Stop by the lab prior to leaving today. I will notify you of your results once received.  ? ?Set up a visit with Dr. Lorelei Pont regarding your knee. ? ?It was a pleasure to see you today! ? ?Preventive Care 36-36 Years Old, Female ?Preventive care refers to lifestyle choices and visits with your health care provider that can promote health and wellness. Preventive care visits are also called wellness exams. ?What can I expect for my preventive care visit? ?Counseling ?During your preventive care visit, your health care provider may ask about your: ?Medical history, including: ?Past medical problems. ?Family medical history. ?Pregnancy history. ?Current health, including: ?Menstrual cycle. ?Method of birth control. ?Emotional well-being. ?Home life and relationship well-being. ?Sexual activity and sexual health. ?Lifestyle, including: ?Alcohol, nicotine or tobacco, and drug use. ?Access to firearms. ?Diet, exercise, and sleep habits. ?Work and work Statistician. ?Sunscreen use. ?Safety issues such as seatbelt and bike helmet use. ?Physical exam ?Your health care provider may check your: ?Height and weight. These may be used to calculate your BMI (body mass index). BMI is a measurement that tells if you are at a healthy weight. ?Waist circumference. This measures the distance around your waistline. This measurement also tells if you are at a healthy weight and may help predict your risk of certain diseases, such as type 2 diabetes and high blood pressure. ?Heart rate and blood pressure. ?Body temperature. ?Skin for abnormal spots. ?What immunizations do I need? ? ?Vaccines are usually given at various ages, according to a schedule. Your health care provider will recommend vaccines for you based on your age, medical history, and lifestyle or other factors, such as travel or where you work. ?What tests do I need? ?Screening ?Your health care provider may recommend screening tests for certain conditions. This may  include: ?Pelvic exam and Pap test. ?Lipid and cholesterol levels. ?Diabetes screening. This is done by checking your blood sugar (glucose) after you have not eaten for a while (fasting). ?Hepatitis B test. ?Hepatitis C test. ?HIV (human immunodeficiency virus) test. ?STI (sexually transmitted infection) testing, if you are at risk. ?BRCA-related cancer screening. This may be done if you have a family history of breast, ovarian, tubal, or peritoneal cancers. ?Talk with your health care provider about your test results, treatment options, and if necessary, the need for more tests. ?Follow these instructions at home: ?Eating and drinking ? ?Eat a healthy diet that includes fresh fruits and vegetables, whole grains, lean protein, and low-fat dairy products. ?Take vitamin and mineral supplements as recommended by your health care provider. ?Do not drink alcohol if: ?Your health care provider tells you not to drink. ?You are pregnant, may be pregnant, or are planning to become pregnant. ?If you drink alcohol: ?Limit how much you have to 0-1 drink a day. ?Know how much alcohol is in your drink. In the [place] one drink equals one 12 oz bottle of beer (355 mL), one 5 oz glass of wine (148 mL), or one 1? oz glass of hard liquor (44 mL). ?Lifestyle ?Brush your teeth every morning and night with fluoride toothpaste. Floss one time each day. ?Exercise for at least 30 minutes 5 or more days each week. ?Do not use any products that contain nicotine or tobacco. These products include cigarettes, chewing tobacco, and vaping devices, such as e-cigarettes. If you need help quitting, ask your health care provider. ?Do not use drugs. ?If you are sexually active, practice safe sex. Use a condom or other form of protection to prevent  STIs. ?If you do not wish to become pregnant, use a form of birth control. If you plan to become pregnant, see your health care provider for a prepregnancy visit. ?Find healthy ways to manage stress, such  as: ?Meditation, yoga, or listening to music. ?Journaling. ?Talking to a trusted person. ?Spending time with friends and family. ?Minimize exposure to UV radiation to reduce your risk of skin cancer. ?Safety ?Always wear your seat belt while driving or riding in a vehicle. ?Do not drive: ?If you have been drinking alcohol. Do not ride with someone who has been drinking. ?If you have been using any mind-altering substances or drugs. ?While texting. ?When you are tired or distracted. ?Wear a helmet and other protective equipment during sports activities. ?If you have firearms in your house, make sure you follow all gun safety procedures. ?Seek help if you have been physically or sexually abused. ?What's next? ?Go to your health care provider once a year for an annual wellness visit. ?Ask your health care provider how often you should have your eyes and teeth checked. ?Stay up to date on all vaccines. ?This information is not intended to replace advice given to you by your health care provider. Make sure you discuss any questions you have with your health care provider. ?Document Revised: 09/14/2020 Document Reviewed: 09/14/2020 ?Elsevier Patient Education ? [employer]. ? ?

## 2021-08-11 ENCOUNTER — Other Ambulatory Visit: Payer: Self-pay | Admitting: Primary Care

## 2021-08-11 DIAGNOSIS — D649 Anemia, unspecified: Secondary | ICD-10-CM

## 2021-08-11 DIAGNOSIS — E876 Hypokalemia: Secondary | ICD-10-CM

## 2021-08-15 DIAGNOSIS — R7989 Other specified abnormal findings of blood chemistry: Secondary | ICD-10-CM

## 2021-08-15 DIAGNOSIS — E876 Hypokalemia: Secondary | ICD-10-CM

## 2021-08-17 ENCOUNTER — Encounter: Payer: Self-pay | Admitting: Primary Care

## 2021-08-22 MED ORDER — POTASSIUM CHLORIDE CRYS ER 20 MEQ PO TBCR: 20.0000 meq | EXTENDED_RELEASE_TABLET | Freq: Two times a day (BID) | ORAL | 0 refills | Status: DC

## 2021-08-23 ENCOUNTER — Other Ambulatory Visit: Payer: 59

## 2021-09-07 ENCOUNTER — Encounter: Payer: Self-pay | Admitting: Primary Care

## 2021-09-13 ENCOUNTER — Encounter: Payer: Self-pay | Admitting: *Deleted

## 2021-10-17 ENCOUNTER — Other Ambulatory Visit: Payer: Self-pay | Admitting: Primary Care

## 2021-10-17 DIAGNOSIS — F419 Anxiety disorder, unspecified: Secondary | ICD-10-CM

## 2021-10-24 NOTE — Telephone Encounter (Signed)
LVM for patient to give [place] a call.

## 2021-10-24 NOTE — Telephone Encounter (Signed)
Please call patient to set up office visit.

## 2021-10-31 NOTE — Telephone Encounter (Signed)
Printed placed in your box for review.

## 2021-11-08 ENCOUNTER — Encounter: Payer: Self-pay | Admitting: *Deleted

## 2021-11-08 ENCOUNTER — Encounter: Payer: Self-pay | Admitting: Primary Care

## 2021-11-08 ENCOUNTER — Ambulatory Visit (INDEPENDENT_AMBULATORY_CARE_PROVIDER_SITE_OTHER): Payer: 59 | Admitting: Primary Care

## 2021-11-08 DIAGNOSIS — I10 Essential (primary) hypertension: Secondary | ICD-10-CM | POA: Diagnosis not present

## 2021-11-08 DIAGNOSIS — D649 Anemia, unspecified: Secondary | ICD-10-CM | POA: Diagnosis not present

## 2021-11-08 DIAGNOSIS — F419 Anxiety disorder, unspecified: Secondary | ICD-10-CM | POA: Diagnosis not present

## 2021-11-08 DIAGNOSIS — E876 Hypokalemia: Secondary | ICD-10-CM

## 2021-11-08 DIAGNOSIS — F32A Depression, unspecified: Secondary | ICD-10-CM

## 2021-11-08 LAB — BASIC METABOLIC PANEL
BUN: 8 mg/dL (ref 6–23)
CO2: 21 mEq/L (ref 19–32)
Calcium: 9.4 mg/dL (ref 8.4–10.5)
Chloride: 100 mEq/L (ref 96–112)
Creatinine, Ser: 0.57 mg/dL (ref 0.40–1.20)
GFR: 116.79 mL/min (ref >60.00)
Glucose, Bld: 125 mg/dL — ABNORMAL HIGH (ref 70–99)
Potassium: 3.6 mEq/L (ref 3.5–5.1)
Sodium: 136 mEq/L (ref 135–145)

## 2021-11-08 LAB — IBC + FERRITIN
Ferritin: 59.3 ng/mL (ref 10.0–291.0)
Iron: 36 ug/dL — ABNORMAL LOW (ref 42–145)
Saturation Ratios: 8.5 % — ABNORMAL LOW (ref 20.0–50.0)
TIBC: 421.4 ug/dL (ref 250.0–450.0)
Transferrin: 301 mg/dL (ref 212.0–360.0)

## 2021-11-08 NOTE — Addendum Note (Signed)
Addended by: Alvina Chou on: 11/08/2021 09:42 AM   Modules accepted: Orders

## 2021-11-08 NOTE — Assessment & Plan Note (Signed)
Controlled.  Continue amlodipine 10 mg daily. Form completed for [facility]

## 2021-11-08 NOTE — Patient Instructions (Signed)
Stop by the lab prior to leaving today. I will notify you of your results once received.   You will be contacted regarding your referral to the kidney doctor and for the hematologist.  Please let us know if you have not been contacted within two weeks.   It was a pleasure to see you today!

## 2021-11-08 NOTE — Progress Notes (Signed)
Subjective:    Patient ID: Orma Flaming, female    DOB: 1985/07/22, 36 y.o.   MRN: [id number]  HPI  Linnea Todisco is a very pleasant 36 y.o. female with a history of hypokalemia, hypertension, migraines, seizure disorder, prediabetes, tachycardia, anxiety and depression who presents today for follow up of hypokalemia and for form completion.  1) Chronic Hypokalemia: Unclear cause. Chronic for years with potassium ranging 2.9-3.3 on average over the year, improved with potassium supplementation. She was referred to nephrology in 2018 and again in 2019, patient never attended either appointment.   She is not managed on diuretic. She has been treated with short courses of potassium supplements over the years with temporary improvement. She is easily lost to follow up. Her most recent potassium level was 3.1 in May 2023. It was recommended she take supplemental potassium and return for repeat labs and further work up. She returns today, three months later.   She does not recall picking up her potassium pills that were sent in to the pharmacy in May 2023. She never saw nephrology. She denies excessive water intake.   2) Chronic Anemia: Chronic over the years with hemoglobin ranging in the low 10-low 11 range. Elevated MCV with history of vitamin B12 deficiency.   She denies menstrual cycles at all as she is managed on Depo Provera injections. She has never seen hematology. She takes vitamin B12 1000 mcg daily. She does not take a multivitamin. Folate last visit was too low. Vitamin B12 level was elevated.   3) Form Completion: She presents today with a form from the [facility] of Nursing to verify prescription mediations. She needs proof that we are managing her amlodipine 10 mg and Lexapro 10 mg daily.   She was accused of taking illegal drugs while employed with her prior employer. She endorses undergoing UDS which was negative. She denies illegal drug use today.   Review of  Systems  Constitutional:  Negative for fatigue.  Respiratory:  Negative for shortness of breath.   Cardiovascular:  Negative for chest pain and palpitations.  Gastrointestinal:  Negative for blood in stool.  Genitourinary:  Negative for vaginal bleeding.  Psychiatric/Behavioral:  The patient is not nervous/anxious.          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    1 son.   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   Social Determinants of Health  Financial Resource Strain: Not on file  Food Insecurity: Not on file  Transportation Needs: Not on file  Physical Activity: Not on file  Stress: Not on file  Social Connections: Not on file  Intimate Partner Violence: Not on file    History reviewed. No pertinent surgical history.  Family History  Problem Relation Age of Onset   Depression Mother    Hypertension Mother    Hyperlipidemia Mother    Hypertension Father    Diabetes Father    Hyperlipidemia Father    Depression Sister    Cancer Paternal Grandmother        Sinus     No Known Allergies  Current Outpatient Medications on  File Prior to Visit  Medication Sig Dispense Refill   amLODipine (NORVASC) 10 MG tablet Take 1 tablet (10 mg total) by mouth daily. for blood pressure. Office visit required for further refills. 90 tablet 0   cyanocobalamin 1000 MCG tablet Take 1,000 mcg by mouth daily.     escitalopram (LEXAPRO) 10 MG tablet TAKE 1 TABLET BY MOUTH DAILY, FOR ANXIETY 90 tablet 2   lamoTRIgine (LAMICTAL) 100 MG tablet Take 1 tablet (100 mg total) by mouth 2 (two) times daily. 180 tablet 0   medroxyPROGESTERone (DEPO-PROVERA) 150 MG/ML injection ADMINISTER 1 ML IN THE MUSCLE EVERY 11 TO 12 WEEKS     potassium chloride SA (KLOR-CON M) 20 MEQ tablet Take 1 tablet (20 mEq total) by mouth 2 (two) times daily. For low potassium. (Patient not taking: Reported on 11/08/2021) 10 tablet 0   [DISCONTINUED] ferrous sulfate 325 (65 FE) MG tablet Take 1 tablet (325 mg total) by mouth daily. 30 tablet 0   [DISCONTINUED] levETIRAcetam (KEPPRA) 500 MG tablet Take 1 tablet (500 mg total) by mouth daily. 14 tablet 0   No current facility-administered medications on file prior to visit.    BP 120/70   Pulse (!) 103   Temp 98.6 F (37 C) (Oral)   Ht 5\' 6"  (1.676 m)   Wt 147 lb (66.7 kg)   SpO2 98%   BMI 23.73 kg/m  Objective:   Physical Exam Cardiovascular:     Rate and Rhythm: Regular rhythm. Tachycardia present.  Pulmonary:     Effort: Pulmonary effort is normal.     Breath sounds: Normal breath sounds.  Musculoskeletal:     Cervical back: Neck supple.  Skin:    General: Skin is warm and dry.  Psychiatric:        Mood and Affect: Mood normal.           Assessment & Plan:   Problem List Items Addressed This Visit       Cardiovascular and Mediastinum   Essential hypertension    Controlled.  Continue amlodipine 10 mg daily. Form completed for [facility]        Other   Anxiety and depression    Controlled.  Continue Lexapro 10 mg daily. Form completed for [place]      Chronic hypokalemia    Unclear  etiology, she has not returned for follow up labs as recommended.  Repeat serum potassium pending. Add urine potassium.  Referral placed again to nephrology for evaluation of her chronic hypokalemia.       Relevant Orders   Ambulatory referral to Nephrology   Basic metabolic panel   Potassium, urine, random   Chronic anemia    Unclear etiology as she does not have a menstrual cycle. MCV remains high despite B12 placement.  Discussed to start multivitamin for folic acid replacement. Repeat labs pending today including iron studies and CBC.  Referral placed to hematology.      Relevant Orders   Ambulatory referral to Hematology / Oncology   IBC + Ferritin   CBC with Differential/Platelet   Pathologist smear review       Doreene Nest, NP

## 2021-11-08 NOTE — Assessment & Plan Note (Signed)
Controlled.  Continue Lexapro 10 mg daily. Form completed for [place] [facility]

## 2021-11-08 NOTE — Assessment & Plan Note (Signed)
Unclear etiology, she has not returned for follow up labs as recommended.  Repeat serum potassium pending. Add urine potassium.  Referral placed again to nephrology for evaluation of her chronic hypokalemia.

## 2021-11-08 NOTE — Assessment & Plan Note (Signed)
Unclear etiology as she does not have a menstrual cycle. MCV remains high despite B12 placement.  Discussed to start multivitamin for folic acid replacement. Repeat labs pending today including iron studies and CBC.  Referral placed to hematology.

## 2021-11-09 ENCOUNTER — Other Ambulatory Visit (INDEPENDENT_AMBULATORY_CARE_PROVIDER_SITE_OTHER): Payer: 59

## 2021-11-09 DIAGNOSIS — D539 Nutritional anemia, unspecified: Secondary | ICD-10-CM | POA: Diagnosis not present

## 2021-11-09 LAB — CBC WITH DIFFERENTIAL/PLATELET
Absolute Monocytes: 631 cells/uL (ref 200–950)
Basophils Absolute: 30 cells/uL (ref 0–200)
Basophils Relative: 0.4 %
Eosinophils Absolute: 68 cells/uL (ref 15–500)
Eosinophils Relative: 0.9 %
HCT: 32 % — ABNORMAL LOW (ref 35.0–45.0)
Hemoglobin: 11 g/dL — ABNORMAL LOW (ref 11.7–15.5)
Lymphs Abs: 1368 cells/uL (ref 850–3900)
MCH: 36.3 pg — ABNORMAL HIGH (ref 27.0–33.0)
MCHC: 34.4 g/dL (ref 32.0–36.0)
MCV: 105.6 fL — ABNORMAL HIGH (ref 80.0–100.0)
MPV: 11.3 fL (ref 7.5–12.5)
Monocytes Relative: 8.3 %
Neutro Abs: 5502 cells/uL (ref 1500–7800)
Neutrophils Relative %: 72.4 %
Platelets: 202 10*3/uL (ref 140–400)
RBC: 3.03 10*6/uL — ABNORMAL LOW (ref 3.80–5.10)
RDW: 14.6 % (ref 11.0–15.0)
Total Lymphocyte: 18 %
WBC: 7.6 10*3/uL (ref 3.8–10.8)

## 2021-11-09 LAB — VITAMIN B12: Vitamin B-12: 705 pg/mL (ref 211–911)

## 2021-11-09 LAB — PATHOLOGIST SMEAR REVIEW

## 2021-11-09 LAB — POTASSIUM, URINE, RANDOM: Potassium Urine: 28 mmol/L (ref 12–129)

## 2021-11-10 ENCOUNTER — Telehealth: Payer: Self-pay | Admitting: Oncology

## 2021-11-10 NOTE — Telephone Encounter (Signed)
Scheduled appt per 8/9 referral. Pt is aware of appt date and time. Pt is aware to arrive 15 mins prior to appt time and to bring and updated insurance card. Pt is aware of appt location.   

## 2021-11-13 ENCOUNTER — Telehealth: Payer: Self-pay | Admitting: *Deleted

## 2021-11-13 NOTE — Telephone Encounter (Signed)
Form completed, signed and placed at front desk for pick up. Notified patient via phone call.

## 2021-11-13 NOTE — Telephone Encounter (Signed)
Received from patient Healthcare Provider Verification form from [facility] board of nursing.  Form asking for medications Dr Marjory Lies prescribes for patient. Placed on MD desk.

## 2021-11-20 ENCOUNTER — Ambulatory Visit: Payer: Self-pay | Admitting: Diagnostic Neuroimaging

## 2021-11-20 ENCOUNTER — Encounter: Payer: Self-pay | Admitting: Diagnostic Neuroimaging

## 2021-11-22 NOTE — Progress Notes (Deleted)
[facility] Cancer Initial Visit:  Patient Care Team: Doreene Nest, NP as PCP - General (Internal Medicine)  CHIEF COMPLAINTS/PURPOSE OF CONSULTATION: Macrocytic anemia  Oncology History   No history exists.    HISTORY OF PRESENTING ILLNESS: Melissa Chapman 36 y.o. female is here because of macrocytic anemia  May 2023:  Folate 2.7  Triglycerides 93  November 24 2021:   Cancer Center Hematology Consult Labs performed in the last year are notable for Hgb  10.2 to 11.6 with MCV ranging from 104 to 117.   Labs earlier this month notable for Ferritin 59.3 B12 705.      Review of Systems - Oncology  MEDICAL HISTORY: Past Medical History:  Diagnosis Date   Anxiety and depression    Chronic hypokalemia    Essential hypertension    Pancreatitis    Seizures (HCC)    last seizure 09/02/20   Syncope    most recent March 2021   Viral gastroenteritis 06/20/2021    SURGICAL HISTORY: No past surgical history on file.  SOCIAL HISTORY: Social History   Socioeconomic History   Marital status: Single    Spouse name: Not on file   Number of children: 1   Years of education: Not on file   Highest education level: Bachelor's degree (e.g., BA, AB, BS)  Occupational History    Comment: RN [employer]  Tobacco Use   Smoking status: Every Day    Packs/day: 0.25    Types: Cigarettes   Smokeless tobacco: Never   Tobacco comments:    04/05/20 "a little more"  Substance and Sexual Activity   Alcohol use: Yes    Comment: socially   Drug use: No   Sexual activity: Yes  Other Topics Concern   Not on file  Social History Narrative   Single.    1 son.   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   Social Determinants of Health   Financial Resource Strain: Not on file  Food Insecurity: Not on file  Transportation Needs: Not on file  Physical Activity: Not on file  Stress: Not on file  Social Connections: Not on file  Intimate  Partner Violence: Not on file    FAMILY HISTORY Family History  Problem Relation Age of Onset   Depression Mother    Hypertension Mother    Hyperlipidemia Mother    Hypertension Father    Diabetes Father    Hyperlipidemia Father    Depression Sister    Cancer Paternal Grandmother        Sinus     ALLERGIES:  has No Known Allergies.  MEDICATIONS:  Current Outpatient Medications  Medication Sig Dispense Refill   amLODipine (NORVASC) 10 MG tablet Take 1 tablet (10 mg total) by mouth daily. for blood pressure. Office visit required for further refills. 90 tablet 0   cyanocobalamin 1000 MCG tablet Take 1,000 mcg by mouth daily.     escitalopram (LEXAPRO) 10 MG tablet TAKE 1 TABLET BY MOUTH DAILY, FOR ANXIETY 90 tablet 2   lamoTRIgine (LAMICTAL) 100 MG tablet Take 1 tablet (100 mg total) by mouth 2 (two) times daily. 180 tablet 0   medroxyPROGESTERone (DEPO-PROVERA) 150 MG/ML injection ADMINISTER 1 ML IN THE MUSCLE EVERY 11 TO 12 WEEKS     potassium chloride SA (KLOR-CON M) 20 MEQ tablet Take 1 tablet (20 mEq total) by mouth 2 (two) times daily. For low potassium. (Patient not taking: Reported on  11/08/2021) 10 tablet 0   No current facility-administered medications for this visit.    PHYSICAL EXAMINATION:  ECOG PERFORMANCE STATUS: {CHL ONC ECOG PS:[phone]}   There were no vitals filed for this visit.  There were no vitals filed for this visit.   Physical Exam   LABORATORY DATA: I have personally reviewed the data as listed:  Lab on 11/09/2021  Component Date Value Ref Range Status   Vitamin B-12 11/09/2021 705  211 - 911 pg/mL Final  Office Visit on 11/08/2021  Component Date Value Ref Range Status   Iron 11/08/2021 36 (L)  42 - 145 ug/dL Final   Transferrin 78/24/2353 301.0  212.0 - 360.0 mg/dL Final   Saturation Ratios 11/08/2021 8.5 (L)  20.0 - 50.0 % Final   Ferritin 11/08/2021 59.3  10.0 - 291.0 ng/mL Final   TIBC 11/08/2021 421.4  250.0 - 450.0 mcg/dL Final    Path Review 61/44/3154    Final   Comment: WBC and Platelet morphology unremarkable. Anemia with RBCs which appear to macrocytic on smear review. Suggest evaluation for vitamin deficiency, if clinically indicated. Reviewed by Dallas Breeding, MD, PhD, FCAP (Electronic Signature on File)    11/09/2021    Sodium 11/08/2021 136  135 - 145 mEq/L Final   Potassium 11/08/2021 3.6  3.5 - 5.1 mEq/L Final   Chloride 11/08/2021 100  96 - 112 mEq/L Final   CO2 11/08/2021 21  19 - 32 mEq/L Final   Glucose, Bld 11/08/2021 125 (H)  70 - 99 mg/dL Final   BUN 00/86/7619 8  6 - 23 mg/dL Final   Creatinine, Ser 11/08/2021 0.57  0.40 - 1.20 mg/dL Final   GFR 50/93/2671 116.79  >60.00 mL/min Final   Calculated using the CKD-EPI Creatinine Equation (2021)   Calcium 11/08/2021 9.4  8.4 - 10.5 mg/dL Final   Potassium Urine 11/08/2021 28  12 - 129 mmol/L Final   WBC 11/08/2021 7.6  3.8 - 10.8 Thousand/uL Final   RBC 11/08/2021 3.03 (L)  3.80 - 5.10 Million/uL Final   Hemoglobin 11/08/2021 11.0 (L)  11.7 - 15.5 g/dL Final   HCT 24/58/0998 32.0 (L)  35.0 - 45.0 % Final   MCV 11/08/2021 105.6 (H)  80.0 - 100.0 fL Final   MCH 11/08/2021 36.3 (H)  27.0 - 33.0 pg Final   MCHC 11/08/2021 34.4  32.0 - 36.0 g/dL Final   RDW 33/82/5053 14.6  11.0 - 15.0 % Final   Platelets 11/08/2021 202  140 - 400 Thousand/uL Final   MPV 11/08/2021 11.3  7.5 - 12.5 fL Final   Neutro Abs 11/08/2021 5,502  1,500 - 7,800 cells/uL Final   Lymphs Abs 11/08/2021 1,368  850 - 3,900 cells/uL Final   Absolute Monocytes 11/08/2021 631  200 - 950 cells/uL Final   Eosinophils Absolute 11/08/2021 68  15 - 500 cells/uL Final   Basophils Absolute 11/08/2021 30  0 - 200 cells/uL Final   Neutrophils Relative % 11/08/2021 72.4  % Final   Total Lymphocyte 11/08/2021 18.0  % Final   Monocytes Relative 11/08/2021 8.3  % Final   Eosinophils Relative 11/08/2021 0.9  % Final   Basophils Relative 11/08/2021 0.4  % Final    RADIOGRAPHIC  STUDIES: I have personally reviewed the radiological images as listed and agree with the findings in the report  No results found.  ASSESSMENT/PLAN  Macrocytic anemia:  Causes can be divided into 3 categories:  Megaloblastic, non-Megaloblastic, False elevations Megaloblastic (involving vitamin B12 and/or folate  deficiencies)    Atrophic gastritis Enteral malabsorption Human immunodeficiency virus treatments Anticonvulsants (some cause folate depletion) Primary bone marrow disorders Nitrous oxide abuse Inherited disorders  Nonmegaloblastic    Alcohol abuse Medication side effects  Myelodysplasia Hypothyroidism Liver disease Hemolysis (reticulocytosis) Hemorrhage Chronic obstructive pulmonary disease Splenectomy  False elevations    Cold agglutinins Hyperglycemia Marked leukocytosis     Folate Deficiency:  Prevalence of folate deficiency has decreased in the [place] since introduction of a mandatory folic acid food fortification program starting in the late 1990s. People with excessive alcohol intake and malnutrition remain at high risk of folate deficiency. Women of childbearing age and non-Hispanic black women, are also at risk of folate deficiency, while some older adults are at risk of over-supplementation  Pathophysiology Causes  Inadequate Ingestion Alcoholism Psychiatric morbidities,  Elderly   Dietary restriction and/or social isolation Prolonged cooking destroys folates   Impaired Absorption Celiac disease  Tropical sprue  Aging  Achlorhydria Anticonvulsants (dilantin)  Zinc deficiency Bacterial overgrowth  Impaired Metabolism Antimetabolites (methotrexate and trimethoprim) Hypothyroidism  Congenital deficiency involving the enzymes of folate metabolism Alcoholism   Increased Requirement Infancy Pregnancy  Lactation  Malignancy Concurrent infection Chronic hemolytic  Psoriasis  Increased Excretion Vitamin B-12 deficiency.  chronic alcoholism   Hemodialysis   Increased Destruction Alcoholism  Cigarette smoking    Laboratory Evaluation:  Obtain CBC with diff, CMP,  Folate and B12 levels. Methyl malonic acid, Homocysteine level, Zinc.  Consider testing for celiac disease.  Evaluate for EtOH use.    Cancer Staging  No matching staging information was found for the patient.   No problem-specific Assessment & Plan notes found for this encounter.   No orders of the defined types were placed in this encounter.   All questions were answered. The patient knows to call the clinic with any problems, questions or concerns.  This note was electronically signed.    Loni Muse, MD  11/22/2021 11:53 AM

## 2021-11-24 ENCOUNTER — Inpatient Hospital Stay: Payer: 59 | Attending: Oncology | Admitting: Oncology

## 2021-12-20 ENCOUNTER — Telehealth: Payer: Self-pay | Admitting: Physician Assistant

## 2021-12-20 NOTE — Telephone Encounter (Signed)
R/s pt's new hem appt per pt request. Pt is aware of new appt date/time.  

## 2021-12-26 ENCOUNTER — Ambulatory Visit: Payer: 59 | Admitting: Diagnostic Neuroimaging

## 2021-12-28 ENCOUNTER — Telehealth: Payer: Self-pay | Admitting: Primary Care

## 2021-12-28 NOTE — Telephone Encounter (Signed)
Lft pt vm to call [phone] press option 3 and then 2 to sch . thanks 

## 2022-01-04 ENCOUNTER — Telehealth: Payer: Self-pay | Admitting: Primary Care

## 2022-01-04 NOTE — Telephone Encounter (Signed)
Lft pt vm to call [phone] to sch. thanks

## 2022-01-11 ENCOUNTER — Other Ambulatory Visit: Payer: Self-pay | Admitting: Physician Assistant

## 2022-01-11 DIAGNOSIS — D539 Nutritional anemia, unspecified: Secondary | ICD-10-CM

## 2022-01-12 ENCOUNTER — Encounter: Payer: Self-pay | Admitting: Physician Assistant

## 2022-01-12 ENCOUNTER — Inpatient Hospital Stay: Payer: 59 | Attending: Oncology | Admitting: Physician Assistant

## 2022-01-12 ENCOUNTER — Inpatient Hospital Stay: Payer: 59

## 2022-01-12 ENCOUNTER — Other Ambulatory Visit: Payer: Self-pay

## 2022-01-12 VITALS — BP 121/83 | HR 116 | Temp 97.7°F | Resp 16 | Ht 66.0 in | Wt 157.9 lb

## 2022-01-12 DIAGNOSIS — Z23 Encounter for immunization: Secondary | ICD-10-CM | POA: Diagnosis not present

## 2022-01-12 DIAGNOSIS — D819 Combined immunodeficiency, unspecified: Secondary | ICD-10-CM

## 2022-01-12 DIAGNOSIS — F32A Depression, unspecified: Secondary | ICD-10-CM | POA: Insufficient documentation

## 2022-01-12 DIAGNOSIS — D539 Nutritional anemia, unspecified: Secondary | ICD-10-CM | POA: Insufficient documentation

## 2022-01-12 DIAGNOSIS — I10 Essential (primary) hypertension: Secondary | ICD-10-CM | POA: Diagnosis not present

## 2022-01-12 DIAGNOSIS — F419 Anxiety disorder, unspecified: Secondary | ICD-10-CM | POA: Insufficient documentation

## 2022-01-12 DIAGNOSIS — Z79899 Other long term (current) drug therapy: Secondary | ICD-10-CM | POA: Diagnosis not present

## 2022-01-12 DIAGNOSIS — Z793 Long term (current) use of hormonal contraceptives: Secondary | ICD-10-CM | POA: Insufficient documentation

## 2022-01-12 DIAGNOSIS — F17210 Nicotine dependence, cigarettes, uncomplicated: Secondary | ICD-10-CM | POA: Diagnosis not present

## 2022-01-12 LAB — IRON AND IRON BINDING CAPACITY (CC-WL,HP ONLY)
Iron: 58 ug/dL (ref 28–170)
Saturation Ratios: 11 % (ref 10.4–31.8)
TIBC: 508 ug/dL — ABNORMAL HIGH (ref 250–450)
UIBC: 450 ug/dL — ABNORMAL HIGH (ref 148–442)

## 2022-01-12 LAB — RETIC PANEL
Immature Retic Fract: 13.3 % (ref 2.3–15.9)
RBC.: 4.03 MIL/uL (ref 3.87–5.11)
Retic Count, Absolute: 65.3 10*3/uL (ref 19.0–186.0)
Retic Ct Pct: 1.6 % (ref 0.4–3.1)
Reticulocyte Hemoglobin: 32.7 pg (ref >27.9)

## 2022-01-12 LAB — CBC WITH DIFFERENTIAL (CANCER CENTER ONLY)
Abs Immature Granulocytes: 0.02 10*3/uL (ref 0.00–0.07)
Basophils Absolute: 0 10*3/uL (ref 0.0–0.1)
Basophils Relative: 0 %
Eosinophils Absolute: 0.1 10*3/uL (ref 0.0–0.5)
Eosinophils Relative: 2 %
HCT: 35.9 % — ABNORMAL LOW (ref 36.0–46.0)
Hemoglobin: 12.4 g/dL (ref 12.0–15.0)
Immature Granulocytes: 0 %
Lymphocytes Relative: 31 %
Lymphs Abs: 2.2 10*3/uL (ref 0.7–4.0)
MCH: 30.7 pg (ref 26.0–34.0)
MCHC: 34.5 g/dL (ref 30.0–36.0)
MCV: 88.9 fL (ref 80.0–100.0)
Monocytes Absolute: 0.4 10*3/uL (ref 0.1–1.0)
Monocytes Relative: 6 %
Neutro Abs: 4.4 10*3/uL (ref 1.7–7.7)
Neutrophils Relative %: 61 %
Platelet Count: 341 10*3/uL (ref 150–400)
RBC: 4.04 MIL/uL (ref 3.87–5.11)
RDW: 14.9 % (ref 11.5–15.5)
WBC Count: 7.3 10*3/uL (ref 4.0–10.5)
nRBC: 0 % (ref 0.0–0.2)

## 2022-01-12 LAB — CMP (CANCER CENTER ONLY)
ALT: 6 U/L (ref 0–44)
AST: 10 U/L — ABNORMAL LOW (ref 15–41)
Albumin: 4.1 g/dL (ref 3.5–5.0)
Alkaline Phosphatase: 135 U/L — ABNORMAL HIGH (ref 38–126)
Anion gap: 9 (ref 5–15)
BUN: 12 mg/dL (ref 6–20)
CO2: 22 mmol/L (ref 22–32)
Calcium: 9.5 mg/dL (ref 8.9–10.3)
Chloride: 100 mmol/L (ref 98–111)
Creatinine: 0.83 mg/dL (ref 0.44–1.00)
GFR, Estimated: >60 mL/min (ref >60)
Glucose, Bld: 323 mg/dL — ABNORMAL HIGH (ref 70–99)
Potassium: 3.9 mmol/L (ref 3.5–5.1)
Sodium: 131 mmol/L — ABNORMAL LOW (ref 135–145)
Total Bilirubin: 0.3 mg/dL (ref 0.3–1.2)
Total Protein: 8.1 g/dL (ref 6.5–8.1)

## 2022-01-12 LAB — FOLATE: Folate: 8.2 ng/mL (ref >5.9)

## 2022-01-12 LAB — LACTATE DEHYDROGENASE: LDH: 111 U/L (ref 98–192)

## 2022-01-12 LAB — VITAMIN B12: Vitamin B-12: 278 pg/mL (ref 180–914)

## 2022-01-12 LAB — FERRITIN: Ferritin: 13 ng/mL (ref 11–307)

## 2022-01-12 MED ORDER — INFLUENZA VAC SPLIT QUAD 0.5 ML IM SUSY: 0.5000 mL | PREFILLED_SYRINGE | Freq: Once | INTRAMUSCULAR | Status: DC

## 2022-01-12 MED ORDER — INFLUENZA VAC SPLIT QUAD 0.5 ML IM SUSY
0.5000 mL | PREFILLED_SYRINGE | Freq: Once | INTRAMUSCULAR | Status: AC
  Administered 2022-01-12: 0.5 mL via INTRAMUSCULAR
  Filled 2022-01-12: qty 0.5

## 2022-01-12 NOTE — Progress Notes (Signed)
[place] Telephone:([phone]   Fax:([phone] [place] NOTE  Patient Care Team: Pleas Koch, NP as PCP - General (Internal Medicine) Cordelia Poche as Physician Assistant (Hematology and Oncology)  CHIEF COMPLAINTS/PURPOSE OF CONSULTATION:  Macrocytic anemia  HISTORY OF PRESENTING ILLNESS:  Melissa Chapman 36 y.o. female with medical history significant for hypertension, seizures, anxiety and depression.  She presents to the hematology clinic for evaluation for macrocytic anemia.  She is unaccompanied for this visit.  On review of the previous records, there is evidence of macrocytic anemia as far back as September 2015.  Most recent labs from 11/08/2021 showed hemoglobin of 11.0, MCV 105.6.  Additionally there is evidence of iron deficiency anemia that was identified with iron 36, saturation 8.5 and ferritin 59.3.  On exam today, Melissa Chapman denies any fatigue and reports her energy levels have been stable for quite some time.  She can complete all her daily activities on her own.  She has a good appetite and denies any dietary restrictions.  Patient denies nausea, vomiting or abdominal pain.  Her bowel habits are unchanged without any recurrent episodes of diarrhea or constipation.  Patient denies easy bruising or signs of active bleeding.  She is on the Depo shot for the last several years and has not had a menstrual cycle as a result.  She denies fevers, chills, night sweats, shortness of breath, chest pain or cough.  She has no other complaints.  Rest of 10 point ROS is below.  MEDICAL HISTORY:  Past Medical History:  Diagnosis Date   Anxiety and depression    Chronic hypokalemia    Essential hypertension    Pancreatitis    Seizures ([place])    last seizure 09/02/20   Syncope    most recent March 2021   Viral gastroenteritis 06/20/2021    SURGICAL HISTORY: History reviewed. No pertinent surgical history.  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    1 son.   Works as a Marine scientist in [employer].    Enjoys watching movies, swimming.   Social Determinants of Health   Financial Resource Strain: Not on file  Food Insecurity: Not on file  Transportation Needs: Not on file  Physical Activity: Not on file  Stress: Not on file  Social Connections: Not on file  Intimate Partner Violence: Not on file    FAMILY HISTORY: Family History  Problem Relation Age of Onset   Depression Mother    Hypertension Mother    Hyperlipidemia Mother    Hypertension Father    Diabetes Father    Hyperlipidemia Father    Lung cancer Father        smoker   Depression Sister    Cancer Paternal Grandmother  ALLERGIES:  has No Known Allergies.  MEDICATIONS:  Current Outpatient Medications  Medication Sig Dispense Refill   amLODipine (NORVASC) 10 MG tablet Take 1 tablet (10 mg total) by mouth daily. for blood pressure. Office visit required for further refills. 90 tablet 0   cyanocobalamin 1000 MCG tablet Take 1,000 mcg by mouth daily.     escitalopram (LEXAPRO) 10 MG tablet TAKE 1 TABLET BY MOUTH DAILY, FOR ANXIETY 90 tablet 2   medroxyPROGESTERone (DEPO-PROVERA) 150 MG/ML injection ADMINISTER 1 ML IN THE MUSCLE EVERY 11 TO 12 WEEKS     ferrous sulfate 325 (65 FE) MG EC tablet Take 1 tablet (325 mg total) by mouth daily with breakfast. 30 tablet 3    lamoTRIgine (LAMICTAL) 100 MG tablet Take 1 tablet (100 mg total) by mouth 2 (two) times daily. 180 tablet 4   potassium chloride SA (KLOR-CON M) 20 MEQ tablet Take 1 tablet (20 mEq total) by mouth 2 (two) times daily. For low potassium. (Patient not taking: Reported on 11/08/2021) 10 tablet 0   No current facility-administered medications for this visit.    REVIEW OF SYSTEMS:   Constitutional: ( - ) fevers, ( - )  chills , ( - ) night sweats Eyes: ( - ) blurriness of vision, ( - ) double vision, ( - ) watery eyes Ears, nose, mouth, throat, and face: ( - ) mucositis, ( - ) sore throat Respiratory: ( - ) cough, ( - ) dyspnea, ( - ) wheezes Cardiovascular: ( - ) palpitation, ( - ) chest discomfort, ( - ) lower extremity swelling Gastrointestinal:  ( - ) nausea, ( - ) heartburn, ( - ) change in bowel habits Skin: ( - ) abnormal skin rashes Lymphatics: ( - ) new lymphadenopathy, ( - ) easy bruising Neurological: ( - ) numbness, ( - ) tingling, ( - ) new weaknesses Behavioral/Psych: ( - ) mood change, ( - ) new changes  All other systems were reviewed with the patient and are negative.  PHYSICAL EXAMINATION: ECOG PERFORMANCE STATUS: 0 - Asymptomatic  Vitals:   01/12/22 1412  BP: 121/83  Pulse: (!) 116  Resp: 16  Temp: 97.7 F (36.5 C)  SpO2: 99%   Filed Weights   01/12/22 1412  Weight: 157 lb 14.4 oz (71.6 kg)    GENERAL: well appearing female in NAD  SKIN: skin color, texture, turgor are normal, no rashes or significant lesions EYES: conjunctiva are pink and non-injected, sclera clear OROPHARYNX: no exudate, no erythema; lips, buccal mucosa, and tongue normal  NECK: supple, non-tender LYMPH:  no palpable lymphadenopathy in the cervical or supraclavicular lymph nodes.  LUNGS: clear to auscultation and percussion with normal breathing effort HEART: regular rate & rhythm and no murmurs and no lower extremity edema Musculoskeletal: no cyanosis of digits and no clubbing  PSYCH:  alert & oriented x 3, fluent speech NEURO: no focal motor/sensory deficits  LABORATORY DATA:  I have reviewed the data as listed    Latest Ref Rng & Units 01/12/2022    3:19 PM 11/08/2021    9:41 AM 08/08/2021   10:03 AM  CBC  WBC 4.0 - 10.5 K/uL 7.3  7.6  6.0   Hemoglobin 12.0 - 15.0 g/dL 12.4  11.0  10.7   Hematocrit 36.0 - 46.0 % 35.9  32.0  32.0   Platelets 150 - 400 K/uL 341  202  219.0        Latest Ref Rng & Units 01/12/2022    3:19  PM 11/08/2021    9:41 AM 08/08/2021   10:03 AM  CMP  Glucose 70 - 99 mg/dL 323  125  136   BUN 6 - 20 mg/dL _0 Creatinine 0.44 - 1.00 mg/dL 0.83  0.57  0.66   Sodium 135 - 145 mmol/L 131  136  143   Potassium 3.5 - 5.1 mmol/L 3.9  3.6  3.1   Chloride 98 - 111 mmol/L 100  100  104   CO2 22 - 32 mmol/L _1 Calcium 8.9 - 10.3 mg/dL 9.5  9.4  8.5   Total Protein 6.5 - 8.1 g/dL 8.1   7.9   Total Bilirubin 0.3 - 1.2 mg/dL 0.3   0.7   Alkaline Phos 38 - 126 U/L 135   213   AST 15 - 41 U/L 10   118   ALT 0 - 44 U/L 6   33    ASSESSMENT & PLAN Melissa Chapman is a 36 y.o. female who presents to the hematology clinic for evaluation for macrocytic anemia.  I reviewed underlying etiologies including nutritional deficiencies, hemolysis or bone marrow disorders.  Patient does have evidence of iron deficiency and history of vitamin B12 deficiency.  She is not taking iron supplementation at this time and takes PO vitamin B12 supplementation sparingly.  Recommend for patient to proceed with laboratory evaluation to check CBC, CMP, LDH, iron panel, vitamin B12, MMA and folate levels.  If there is evidence of iron deficiency, we will determine if she needs to resume p.o. versus IV iron replacement.  Additionally she will need a referral to gastroenterology to evaluate for possible GI source of iron deficiency.  #Macrocytic anemia: --H/o vitamin B12 deficiency, currently takes PO vitamin B12 replacement intermittently. --H/o iron deficiency, not  taking iron supplementation --No evidence of bleeding or dietary restrictions --Recommend labs to check ABC, CMP, LDH, reticulocyte panel, ferritin, iron and TIBC, vitamin B12, methylmalonic acid, and folate levels. -- We will determine return appointments after work-up is complete.  Orders Placed This Encounter  Procedures   Lactate dehydrogenase (LDH)    Standing Status:   Future    Number of Occurrences:   1    Standing Expiration Date:   01/12/2023    All questions were answered. The patient knows to call the clinic with any problems, questions or concerns.  I have spent a total of 60 minutes minutes of face-to-face and non-face-to-face time, preparing to see the patient, obtaining and/or reviewing separately obtained history, performing a medically appropriate examination, counseling and educating the patient, ordering tests/procedures,documenting clinical information in the electronic health record,  and care coordination.   Dede Query, PA-C Department of Hematology/Oncology [place] at [facility] Phone: [phone]  Patient was seen with Dr. Lorenso Courier  I have read the above note and personally examined the patient. I agree with the assessment and plan as noted above.  Briefly Melissa Chapman is a 36 year old female who presents for evaluation of macrocytic anemia. Her labs from 11/08/2021 showed hemoglobin of 11.0, MCV 105.6.  Today we discussed the differential for macrocytic anemia including vitamin B12 deficiency, folate deficiency, hemolytic anemia, and bone marrow disorders.  We will conduct a work-up of nutritional studies and if no clear etiology can be found we will need to consider bone marrow biopsy.  The patient voiced understanding of the plan moving forward.   Ledell Peoples, MD Department  of Hematology/Oncology [place] at [facility] Phone: [phone] Pager: [phone] Email:  [email or web address]

## 2022-01-12 NOTE — Patient Instructions (Signed)
Influenza Virus Vaccine injection What is this medication? INFLUENZA VIRUS VACCINE (in floo EN zuh VAHY ruhs vak SEEN) helps to reduce the risk of getting influenza also known as the flu. The vaccine only helps protect you against some strains of the flu. This medicine may be used for other purposes; ask your health care provider or pharmacist if you have questions. COMMON BRAND NAME(S): Afluria, Afluria Quadrivalent, Agriflu, Alfuria, FLUAD, FLUAD Quadrivalent, Fluarix, Fluarix Quadrivalent, Flublok, Flublok Quadrivalent, FLUCELVAX, FLUCELVAX Quadrivalent, Flulaval, Flulaval Quadrivalent, Fluvirin, Fluzone, Fluzone High-Dose, Fluzone Intradermal, Fluzone Quadrivalent What should I tell my care team before I take this medication? They need to know if you have any of these conditions: bleeding disorder like hemophilia fever or infection Guillain-Barre syndrome or other neurological problems immune system problems infection with the human immunodeficiency virus (HIV) or AIDS low blood platelet counts multiple sclerosis an unusual or allergic reaction to influenza virus vaccine, latex, other medicines, foods, dyes, or preservatives. Different brands of vaccines contain different allergens. Some may contain latex or eggs. Talk to your doctor about your allergies to make sure that you get the right vaccine. pregnant or trying to get pregnant breast-feeding How should I use this medication? This vaccine is for injection into a muscle or under the skin. It is given by a health care professional. A copy of Vaccine Information Statements will be given before each vaccination. Read this sheet carefully each time. The sheet may change frequently. Talk to your healthcare provider to see which vaccines are right for you. Some vaccines should not be used in all age groups. Overdosage: If you think you have taken too much of this medicine contact a poison control center or emergency room at once. NOTE: This  medicine is only for you. Do not share this medicine with others. What if I miss a dose? This does not apply. What may interact with this medication? chemotherapy or radiation therapy medicines that lower your immune system like etanercept, anakinra, infliximab, and adalimumab medicines that treat or prevent blood clots like warfarin phenytoin steroid medicines like prednisone or cortisone theophylline vaccines This list may not describe all possible interactions. Give your health care provider a list of all the medicines, herbs, non-prescription drugs, or dietary supplements you use. Also tell them if you smoke, drink alcohol, or use illegal drugs. Some items may interact with your medicine. What should I watch for while using this medication? Report any side effects that do not go away within 3 days to your doctor or health care professional. Call your health care provider if any unusual symptoms occur within 6 weeks of receiving this vaccine. You may still catch the flu, but the illness is not usually as bad. You cannot get the flu from the vaccine. The vaccine will not protect against colds or other illnesses that may cause fever. The vaccine is needed every year. What side effects may I notice from receiving this medication? Side effects that you should report to your doctor or health care professional as soon as possible: allergic reactions like skin rash, itching or hives, swelling of the face, lips, or tongue Side effects that usually do not require medical attention (report to your doctor or health care professional if they continue or are bothersome): fever headache muscle aches and pains pain, tenderness, redness, or swelling at the injection site tiredness This list may not describe all possible side effects. Call your doctor for medical advice about side effects. You may report side effects to FDA at 1-800-FDA-1088.   Where should I keep my medication? The vaccine will be given  by a health care professional in a clinic, pharmacy, doctor's office, or other health care setting. You will not be given vaccine doses to store at home. NOTE: This sheet is a summary. It may not cover all possible information. If you have questions about this medicine, talk to your doctor, pharmacist, or health care provider.  2023 Elsevier/Gold Standard (2020-10-21 00:00:00)  

## 2022-01-15 ENCOUNTER — Telehealth: Payer: Self-pay | Admitting: Physician Assistant

## 2022-01-15 DIAGNOSIS — E611 Iron deficiency: Secondary | ICD-10-CM

## 2022-01-15 LAB — METHYLMALONIC ACID, SERUM: Methylmalonic Acid, Quantitative: 168 nmol/L (ref 0–378)

## 2022-01-15 MED ORDER — FERROUS SULFATE 325 (65 FE) MG PO TBEC: 325.0000 mg | DELAYED_RELEASE_TABLET | Freq: Every day | ORAL | 3 refills | Status: DC

## 2022-01-15 NOTE — Telephone Encounter (Signed)
I spoke to [name] to review the lab results from 01/12/2022.  Findings revealed that macrocytic anemia has resolved.  There is persistent iron deficiency so recommend to resume p.o. iron supplementation with ferrous sulfate 325 mg once daily with a source of vitamin C.  Since patient denies any source of bleeding including no menstrual bleeding, we will make a referral to gastroenterology for further evaluation.  We will see the patient back in 3 months with repeat labs.  She expressed understanding and satisfaction with the plan provided.

## 2022-01-15 NOTE — Telephone Encounter (Signed)
Per 10/16 secure chat called and spoke to pt about appointment

## 2022-01-16 ENCOUNTER — Encounter: Payer: Self-pay | Admitting: Diagnostic Neuroimaging

## 2022-01-16 ENCOUNTER — Ambulatory Visit (INDEPENDENT_AMBULATORY_CARE_PROVIDER_SITE_OTHER): Payer: Commercial Managed Care - HMO | Admitting: Diagnostic Neuroimaging

## 2022-01-16 VITALS — BP 122/77 | HR 109 | Ht 66.0 in | Wt 160.0 lb

## 2022-01-16 DIAGNOSIS — G40909 Epilepsy, unspecified, not intractable, without status epilepticus: Secondary | ICD-10-CM | POA: Diagnosis not present

## 2022-01-16 MED ORDER — LAMOTRIGINE 100 MG PO TABS: 100.0000 mg | ORAL_TABLET | Freq: Two times a day (BID) | ORAL | 4 refills | Status: DC

## 2022-01-16 NOTE — Progress Notes (Signed)
[facility]  PATIENT: Melissa Chapman DOB: [birth year]  REFERRING CLINICIAN: Pleas Koch, NP HISTORY FROM: patient REASON FOR VISIT: follow up    HISTORICAL  CHIEF COMPLAINT:  Chief Complaint  Patient presents with   Seizures    Rm 7, 6 month FU, alone   "doing well, no seizure activity, tolerating lamictal well"    HISTORY OF PRESENT ILLNESS:   UPDATE (01/16/22, VRP): Since last visit, doing well. Symptoms are stable. No seizures. Tolerating lamotrigine.Marland Kitchen    UPDATE (09/05/20, VRP): Since last visit, was doing well until last couple of weeks with severely increased stress and anxiety.  Patient was feeling more jittery and shaky and called our office on 09/01/2020.  The next day she was having memory lapse, confusion, and other symptoms that prompted her mother to go check on her.  Patient's mother witnessed a abnormal spell of loss of consciousness which lasted for few seconds.  They called 911 for evaluation and paramedics arrived on scene.  Patient returned to baseline fairly quickly and declined to go to the emergency room.  Since then patient continues to feel jittery and shaky.  UPDATE (04/05/20, VRP): Since last visit, had been intermittently taking lamotrigine (due to agitation side effects). Then had breakthrough seizure in 02/28/20 (at home, in bathroom, tongue biting, shaking, spitting up, post-ictal confusion). Went to ER, and then started on LEV + lamotrigine. Now tolerating both meds.   PRIOR HPI: 36 year old female here for evaluation of seizure vs syncope.  History of hypertension, anxiety, possible alcoholic pancreatitis in 0000000 and 2018.   04/12/2018 patient was at home, woke up in the emergency room.  Apparently she had passed out.  She was diagnosed with dehydration and syncope.  06/28/2019 patient had just arrived in [place] with her son, was at the hotel when she collapsed and had seizure-like activity.  Apparently she woke up and was able to  talk to the staff but then had a second event.  Patient does not remember waking up in between the 2 events.  She was taken to local hospital and had CT and EEG.  She does not have these results.  She does not know what the conclusion of her work-up was.  Patient was able to come back to the [place] safely.  Patient denies any excessive alcohol use recently.  She states she drinks about 3 drinks per week.  She did not have any alcohol on 06/28/2019.  Patient does endorse chronic sleep deprivation and irregular sleep pattern.  She has history of anxiety.  She has been on bupropion for 2 years.  This was increased in dosage in 24-Aug-2018 after her father passed away.  She was previously on Zoloft.  Patient has had issues with chronic nausea, hypokalemia, abdominal pain in the past.  She also had pancreatitis x2 in 2017, 2018, possibly related to excessive alcohol use at that time.  No family history of seizure.  Patient lives at home with her 13-year-old son.  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  She is not currently working.    REVIEW OF SYSTEMS: Full 14 system review of systems performed and negative with exception of: as per HPI.  ALLERGIES: No Known Allergies  HOME MEDICATIONS: Outpatient Medications Prior to Visit  Medication Sig Dispense Refill   amLODipine (NORVASC) 10 MG tablet Take 1 tablet (10 mg total) by mouth daily. for blood pressure. Office visit required for  further refills. 90 tablet 0   cyanocobalamin 1000 MCG tablet Take 1,000 mcg by mouth daily.     escitalopram (LEXAPRO) 10 MG tablet TAKE 1 TABLET BY MOUTH DAILY, FOR ANXIETY 90 tablet 2   ferrous sulfate 325 (65 FE) MG EC tablet Take 1 tablet (325 mg total) by mouth daily with breakfast. 30 tablet 3   medroxyPROGESTERone (DEPO-PROVERA) 150 MG/ML injection ADMINISTER 1 ML IN THE MUSCLE EVERY 11 TO 12 WEEKS     lamoTRIgine (LAMICTAL) 100 MG tablet Take 1 tablet  (100 mg total) by mouth 2 (two) times daily. 180 tablet 0   potassium chloride SA (KLOR-CON M) 20 MEQ tablet Take 1 tablet (20 mEq total) by mouth 2 (two) times daily. For low potassium. (Patient not taking: Reported on 11/08/2021) 10 tablet 0   No facility-administered medications prior to visit.    PAST MEDICAL HISTORY: Past Medical History:  Diagnosis Date   Anxiety and depression    Chronic hypokalemia    Essential hypertension    Pancreatitis    Seizures ([place])    last seizure 09/02/20   Syncope    most recent March 2021   Viral gastroenteritis 06/20/2021    PAST SURGICAL HISTORY: History reviewed. No pertinent surgical history.  FAMILY HISTORY: Family History  Problem Relation Age of Onset   Depression Mother    Hypertension Mother    Hyperlipidemia Mother    Hypertension Father    Diabetes Father    Hyperlipidemia Father    Lung cancer Father        smoker   Depression Sister    Cancer Paternal Grandmother     SOCIAL HISTORY: Social History   Socioeconomic History   Marital status: Single    Spouse name: Not on file   Number of children: 1   Years of education: Not on file   Highest education level: Bachelor's degree (e.g., BA, AB, BS)  Occupational History    Comment: RN [employer]  Tobacco Use   Smoking status: Every Day    Packs/day: 1.00    Years: 13.00    Total pack years: 13.00    Types: Cigarettes   Smokeless tobacco: Never  Substance and Sexual Activity   Alcohol use: Yes    Comment: socially   Drug use: No   Sexual activity: Yes  Other Topics Concern   Not on file  Social History Narrative   Single.    1 son.   Works as a Marine scientist in [employer].    Enjoys watching movies, swimming.   Social Determinants of Health   Financial Resource Strain: Not on file  Food Insecurity: Not on file  Transportation Needs: Not on file  Physical Activity: Not on file  Stress: Not on file  Social Connections: Not on file  Intimate Partner Violence: Not on  file     PHYSICAL EXAM  GENERAL EXAM/CONSTITUTIONAL: Vitals:  Vitals:   01/16/22 1352  BP: 122/77  Pulse: (!) 109  Weight: 160 lb 0.6 oz (72.6 kg)  Height: 5\' 6"  (1.676 m)   Body mass index is 25.83 kg/m. Wt Readings from Last 3 Encounters:  01/16/22 160 lb 0.6 oz (72.6 kg)  01/12/22 157 lb 14.4 oz (71.6 kg)  11/08/21 147 lb (66.7 kg)   Patient is in no distress; well developed, nourished and groomed; neck is supple  CARDIOVASCULAR: Examination of carotid arteries is normal; no carotid bruits TACHYCARDIA; no murmurs Examination of peripheral vascular system by observation and palpation is normal  EYES: Ophthalmoscopic exam of optic discs and posterior segments is normal; no papilledema or hemorrhages No results found.  MUSCULOSKELETAL: Gait, strength, tone, movements noted in Neurologic exam below  NEUROLOGIC: MENTAL STATUS:      No data to display         awake, alert, oriented to person, place and time recent and remote memory intact normal attention and concentration language fluent, comprehension intact, naming intact fund of knowledge appropriate  CRANIAL NERVE:  2nd - no papilledema on fundoscopic exam 2nd, 3rd, 4th, 6th - pupils equal and reactive to light, visual fields full to confrontation, extraocular muscles intact, no nystagmus 5th - facial sensation symmetric 7th - facial strength symmetric 8th - hearing intact 9th - palate elevates symmetrically, uvula midline 11th - shoulder shrug symmetric 12th - tongue protrusion midline  MOTOR:  POSTURAL AND ACTION TREMOR IN BUE normal bulk and tone, full strength in the BUE, BLE  SENSORY:  normal and symmetric to light touch, temperature, vibration  COORDINATION:  finger-nose-finger, fine finger movements normal  REFLEXES:  deep tendon reflexes SLIGHTLY BRISK and symmetric  GAIT/STATION:  narrow based gait     DIAGNOSTIC DATA (LABS, IMAGING, TESTING) - I reviewed patient records,  labs, notes, testing and imaging myself where available.  Lab Results  Component Value Date   WBC 7.3 01/12/2022   HGB 12.4 01/12/2022   HCT 35.9 (L) 01/12/2022   MCV 88.9 01/12/2022   PLT 341 01/12/2022      Component Value Date/Time   NA 131 (L) 01/12/2022 1519   NA 138 09/05/2020 1648   NA 138 12/13/2013 0445   K 3.9 01/12/2022 1519   K 3.2 (L) 12/13/2013 0445   CL 100 01/12/2022 1519   CL 105 12/13/2013 0445   CO2 22 01/12/2022 1519   CO2 26 12/13/2013 0445   GLUCOSE 323 (H) 01/12/2022 1519   GLUCOSE 75 12/13/2013 0445   BUN 12 01/12/2022 1519   BUN 11 09/05/2020 1648   BUN 4 (L) 12/13/2013 0445   CREATININE 0.83 01/12/2022 1519   CREATININE 0.84 12/13/2013 0445   CALCIUM 9.5 01/12/2022 1519   CALCIUM 7.3 (L) 12/13/2013 0445   PROT 8.1 01/12/2022 1519   PROT 7.9 09/05/2020 1648   PROT 7.6 12/11/2013 2310   ALBUMIN 4.1 01/12/2022 1519   ALBUMIN 4.9 (H) 09/05/2020 1648   ALBUMIN 3.8 12/11/2013 2310   AST 10 (L) 01/12/2022 1519   ALT 6 01/12/2022 1519   ALT 23 12/11/2013 2310   ALKPHOS 135 (H) 01/12/2022 1519   ALKPHOS 65 12/11/2013 2310   BILITOT 0.3 01/12/2022 1519   GFRNONAA >60 01/12/2022 1519   GFRNONAA >60 12/13/2013 0445   GFRAA >60 06/18/2018 1858   GFRAA >60 12/13/2013 0445   Lab Results  Component Value Date   CHOL 138 08/08/2021   HDL 61.70 08/08/2021   LDLCALC 58 08/08/2021   LDLDIRECT 44.0 09/02/2017   TRIG 93.0 08/08/2021   CHOLHDL 2 08/08/2021   Lab Results  Component Value Date   HGBA1C 5.4 08/08/2021   Lab Results  Component Value Date   VITAMINB12 278 01/12/2022   Lab Results  Component Value Date   TSH 1.41 08/08/2021    09/07/19 EEG  - Normal EEG in the awake and drowsy states.  09/14/20 EEG - Normal     ASSESSMENT AND PLAN  36 y.o. year old female here with:  Dx:  1. Seizure disorder ([place])     PLAN:  SEIZURE DISORDER --> last events  04/12/18, 06/28/19 x 2, 02/28/20, 09/05/20 - continue lamotrigine 100mg  twice a  day for seizure prevention (can also help with mood stabilization)  Meds ordered this encounter  Medications   lamoTRIgine (LAMICTAL) 100 MG tablet    Sig: Take 1 tablet (100 mg total) by mouth 2 (two) times daily.    Dispense:  180 tablet    Refill:  4   Return in about 1 year (around 01/17/2023) for with NP Frann Rider), MyChart visit (15 min).   Penni Bombard, MD 76/22/6333, 5:45 PM Certified in Neurology, Neurophysiology and Neuroimaging  [facility] [address] [place],  [address] [phone]

## 2022-01-19 ENCOUNTER — Encounter: Payer: Self-pay | Admitting: Primary Care

## 2022-02-26 LAB — BASIC METABOLIC PANEL
BUN: 14 (ref 4–21)
CO2: 19 (ref 13–22)
Chloride: 96 — AB (ref 99–108)
Creatinine: 1.1 (ref 0.5–1.1)
Glucose: 465
Potassium: 4.6 mEq/L (ref 3.5–5.1)
Sodium: 133 — AB (ref 137–147)

## 2022-02-26 LAB — COMPREHENSIVE METABOLIC PANEL
Albumin: 4.6 (ref 3.5–5.0)
Calcium: 10 (ref 8.7–10.7)
eGFR: 66

## 2022-02-26 LAB — PROTEIN / CREATININE RATIO, URINE: Creatinine, Urine: 33.9

## 2022-03-02 ENCOUNTER — Telehealth: Payer: Self-pay | Admitting: Primary Care

## 2022-03-02 NOTE — Telephone Encounter (Signed)
Please call patient:  Received lab results from [facility] and see that glucose/blood sugar level was 465.  This is a significant change from when I saw her last in May 2023.  Please schedule her for a follow-up visit immediately.  If you cannot get a hold of her on the phone, send a MyChart message as well.

## 2022-03-05 NOTE — Telephone Encounter (Signed)
Scheduled patient for follow up 03/08/22 @ 3:00

## 2022-03-08 ENCOUNTER — Encounter: Payer: Self-pay | Admitting: Primary Care

## 2022-03-08 ENCOUNTER — Ambulatory Visit (INDEPENDENT_AMBULATORY_CARE_PROVIDER_SITE_OTHER): Payer: 59 | Admitting: Primary Care

## 2022-03-08 VITALS — BP 122/76 | Temp 98.1°F | Ht 66.0 in | Wt 162.0 lb

## 2022-03-08 DIAGNOSIS — R7303 Prediabetes: Secondary | ICD-10-CM

## 2022-03-08 DIAGNOSIS — E1165 Type 2 diabetes mellitus with hyperglycemia: Secondary | ICD-10-CM

## 2022-03-08 LAB — POCT GLYCOSYLATED HEMOGLOBIN (HGB A1C): Hemoglobin A1C: 12.9 % — AB (ref 4.0–5.6)

## 2022-03-08 MED ORDER — BLOOD GLUCOSE MONITOR KIT: PACK | 0 refills | Status: AC

## 2022-03-08 MED ORDER — METFORMIN HCL ER 500 MG PO TB24: ORAL_TABLET | ORAL | 0 refills | Status: DC

## 2022-03-08 NOTE — Patient Instructions (Addendum)
Start metformin XR 500 mg diabetes.  Take 1 tablet mouth once daily in the morning with breakfast for 2 weeks, then increase to 2 tablets daily in the morning with breakfast thereafter.   Start checking your blood sugar levels.  Appropriate times to check your blood sugar levels are:  -Before any meal (breakfast, lunch, dinner) -Two hours after any meal (breakfast, lunch, dinner) -Bedtime  Record your readings and bring them to your next visit.   Schedule a follow up visit with me for 1 month.   It was a pleasure to see you today!  Diabetes Mellitus and Nutrition, Adult  When you have diabetes, or diabetes mellitus, it is very important to have healthy eating habits because your blood sugar (glucose) levels are greatly affected by what you eat and drink. Eating healthy foods in the right amounts, at about the same times every day, can help you: Manage your blood glucose. Lower your risk of heart disease. Improve your blood pressure. Reach or maintain a healthy weight. What can affect my meal plan? Every person with diabetes is different, and each person has different needs for a meal plan. Your health care provider may recommend that you work with a dietitian to make a meal plan that is best for you. Your meal plan may vary depending on factors such as: The calories you need. The medicines you take. Your weight. Your blood glucose, blood pressure, and cholesterol levels. Your activity level. Other health conditions you have, such as heart or kidney disease. How do carbohydrates affect me? Carbohydrates, also called carbs, affect your blood glucose level more than any other type of food. Eating carbs raises the amount of glucose in your blood. It is important to know how many carbs you can safely have in each meal. This is different for every person. Your dietitian can help you calculate how many carbs you should have at each meal and for each snack. How does alcohol affect  me? Alcohol can cause a decrease in blood glucose (hypoglycemia), especially if you use insulin or take certain diabetes medicines by mouth. Hypoglycemia can be a life-threatening condition. Symptoms of hypoglycemia, such as sleepiness, dizziness, and confusion, are similar to symptoms of having too much alcohol. Do not drink alcohol if: Your health care provider tells you not to drink. You are pregnant, may be pregnant, or are planning to become pregnant. If you drink alcohol: Limit how much you have to: 0-1 drink a day for women. 0-2 drinks a day for men. Know how much alcohol is in your drink. In the [place] one drink equals one 12 oz bottle of beer (355 mL), one 5 oz glass of wine (148 mL), or one 1 oz glass of hard liquor (44 mL). Keep yourself hydrated with water, diet soda, or unsweetened iced tea. Keep in mind that regular soda, juice, and other mixers may contain a lot of sugar and must be counted as carbs. What are tips for following this plan?  Reading food labels Start by checking the serving size on the Nutrition Facts label of packaged foods and drinks. The number of calories and the amount of carbs, fats, and other nutrients listed on the label are based on one serving of the item. Many items contain more than one serving per package. Check the total grams (g) of carbs in one serving. Check the number of grams of saturated fats and trans fats in one serving. Choose foods that have a low amount or none of these  fats. Check the number of milligrams (mg) of salt (sodium) in one serving. Most people should limit total sodium intake to less than 2,300 mg per day. Always check the nutrition information of foods labeled as "low-fat" or "nonfat." These foods may be higher in added sugar or refined carbs and should be avoided. Talk to your dietitian to identify your daily goals for nutrients listed on the label. Shopping Avoid buying canned, pre-made, or processed foods. These foods tend to  be high in fat, sodium, and added sugar. Shop around the outside edge of the grocery store. This is where you will most often find fresh fruits and vegetables, bulk grains, fresh meats, and fresh dairy products. Cooking Use low-heat cooking methods, such as baking, instead of high-heat cooking methods, such as deep frying. Cook using healthy oils, such as olive, canola, or sunflower oil. Avoid cooking with butter, cream, or high-fat meats. Meal planning Eat meals and snacks regularly, preferably at the same times every day. Avoid going long periods of time without eating. Eat foods that are high in fiber, such as fresh fruits, vegetables, beans, and whole grains. Eat 4-6 oz (112-168 g) of lean protein each day, such as lean meat, chicken, fish, eggs, or tofu. One ounce (oz) (28 g) of lean protein is equal to: 1 oz (28 g) of meat, chicken, or fish. 1 egg.  cup (62 g) of tofu. Eat some foods each day that contain healthy fats, such as avocado, nuts, seeds, and fish. What foods should I eat? Fruits Berries. Apples. Oranges. Peaches. Apricots. Plums. Grapes. Mangoes. Papayas. Pomegranates. Kiwi. Cherries. Vegetables Leafy greens, including lettuce, spinach, kale, chard, collard greens, mustard greens, and cabbage. Beets. Cauliflower. Broccoli. Carrots. Green beans. Tomatoes. Peppers. Onions. Cucumbers. Brussels sprouts. Grains Whole grains, such as whole-wheat or whole-grain bread, crackers, tortillas, cereal, and pasta. Unsweetened oatmeal. Quinoa. Brown or wild rice. Meats and other proteins Seafood. Poultry without skin. Lean cuts of poultry and beef. Tofu. Nuts. Seeds. Dairy Low-fat or fat-free dairy products such as milk, yogurt, and cheese. The items listed above may not be a complete list of foods and beverages you can eat and drink. Contact a dietitian for more information. What foods should I avoid? Fruits Fruits canned with syrup. Vegetables Canned vegetables. Frozen vegetables  with butter or cream sauce. Grains Refined white flour and flour products such as bread, pasta, snack foods, and cereals. Avoid all processed foods. Meats and other proteins Fatty cuts of meat. Poultry with skin. Breaded or fried meats. Processed meat. Avoid saturated fats. Dairy Full-fat yogurt, cheese, or milk. Beverages Sweetened drinks, such as soda or iced tea. The items listed above may not be a complete list of foods and beverages you should avoid. Contact a dietitian for more information. Questions to ask a health care provider Do I need to meet with a certified diabetes care and education specialist? Do I need to meet with a dietitian? What number can I call if I have questions? When are the best times to check my blood glucose? Where to find more information: American Diabetes Association: diabetes.org Academy of Nutrition and Dietetics: eatright.[employer] of Diabetes and Digestive and Kidney Diseases: StageSync.si Association of Diabetes Care & Education Specialists: diabeteseducator.org Summary It is important to have healthy eating habits because your blood sugar (glucose) levels are greatly affected by what you eat and drink. It is important to use alcohol carefully. A healthy meal plan will help you manage your blood glucose and lower your risk of  heart disease. Your health care provider may recommend that you work with a dietitian to make a meal plan that is best for you. This information is not intended to replace advice given to you by your health care provider. Make sure you discuss any questions you have with your health care provider. Document Revised: 10/21/2019 Document Reviewed: 10/21/2019 Elsevier Patient Education  2023 [employer].

## 2022-03-08 NOTE — Assessment & Plan Note (Signed)
New onset with A1c of 12.9! Last A1c was in May 2023 which was 5.4.  Unclear trigger, but according to chart review it appears that her glucose readings changed from August to October 2023.  We had a long discussion today regarding her diet which included elimination of sweets and carb heavy meals.  She will increase fresh fruit vegetables, lean protein, and choose whole grains.  Handout regarding diabetes nutrition provided.    She is very motivated to change her diet so we will hold off on insulin at this time.  Start with metformin XR 500 mg daily x 2 weeks, then increase to 2 tablets daily thereafter.  She would like to hold off on a second agent at this time.  Prescription for glucometer kit sent to pharmacy.  We also discussed how to check glucose levels and appropriate times check. Blank glucose log provided today.  Will plan to see her back in the office in 1 month with her glucose logs.  Would like to test for type 1 diabetes, however we need to have her fasting glucose level below 180.  She is not fasting today.  We will need to test C-peptide, isolate cell antibodies, and ZnT8 antibodies.

## 2022-03-08 NOTE — Progress Notes (Signed)
Subjective:    Patient ID: Melissa Chapman, female    DOB: December 12, 1985, 36 y.o.   MRN: [id number]  HPI  Melissa Chapman is a very pleasant 36 y.o. female with a history of seizure disorder, chronic anemia, prediabetes, chronic hypokalemia, migraines, hypertension who presents today for further evaluation of hyperglycemia.  She was evaluated by her nephrologist a few weeks ago who completed lab work.  Within her lab work her glucose was noted to be over 465 and urinalysis with 3+ glucose. Given these findings she was asked to come in the office today for further evaluation.  History of prediabetes with last A1c of 5.4 in May 2023. She has increased her intake of sugar and carbs over the last few months. She's eating out at restaurants daily, has noticed eating larger portion sizes. She will have something sweet after each meal and most meals are carb heavy.  She denies polydipsia, polyuria, vaginal itching, vaginal discharge, urinary frequency, dizziness, blurred vision, numbness, changes in medications, recent illness, seizures, traumatic injury.  She has family history of prediabetes in her mother who is on jardiance.   Wt Readings from Last 3 Encounters:  03/08/22 162 lb (73.5 kg)  01/16/22 160 lb 0.6 oz (72.6 kg)  01/12/22 157 lb 14.4 oz (71.6 kg)       Review of Systems  Constitutional:  Negative for fatigue.  Eyes:  Negative for visual disturbance.  Endocrine: Positive for polyphagia. Negative for polydipsia and polyuria.  Genitourinary:  Negative for vaginal discharge.  Neurological:  Negative for numbness.         Past Medical History:  Diagnosis Date   Anxiety and depression    Chronic hypokalemia    Essential hypertension    Pancreatitis    Seizures ([place])    last seizure 09/02/20   Syncope    most recent March 2021   Viral gastroenteritis 06/20/2021    Social History   Socioeconomic History   Marital status: Single    Spouse name: Not on file   Number of  children: 1   Years of education: Not on file   Highest education level: Bachelor's degree (e.g., BA, AB, BS)  Occupational History    Comment: RN [employer]  Tobacco Use   Smoking status: Every Day    Packs/day: 1.00    Years: 13.00    Total pack years: 13.00    Types: Cigarettes   Smokeless tobacco: Never  Substance and Sexual Activity   Alcohol use: Yes    Comment: socially   Drug use: No   Sexual activity: Yes  Other Topics Concern   Not on file  Social History Narrative   Single.    1 son.   Works as a Marine scientist in [employer].    Enjoys watching movies, swimming.   Social Determinants of Health   Financial Resource Strain: Not on file  Food Insecurity: Not on file  Transportation Needs: Not on file  Physical Activity: Not on file  Stress: Not on file  Social Connections: Not on file  Intimate Partner Violence: Not on file    History reviewed. No pertinent surgical history.  Family History  Problem Relation Age of Onset   Depression Mother    Hypertension Mother    Hyperlipidemia Mother    Hypertension Father    Diabetes Father    Hyperlipidemia Father    Lung cancer Father        smoker   Depression Sister    Cancer Paternal  Grandmother     No Known Allergies  Current Outpatient Medications on File Prior to Visit  Medication Sig Dispense Refill   amLODipine (NORVASC) 10 MG tablet Take 1 tablet (10 mg total) by mouth daily. for blood pressure. Office visit required for further refills. 90 tablet 0   cyanocobalamin 1000 MCG tablet Take 1,000 mcg by mouth daily.     escitalopram (LEXAPRO) 10 MG tablet TAKE 1 TABLET BY MOUTH DAILY, FOR ANXIETY 90 tablet 2   ferrous sulfate 325 (65 FE) MG EC tablet Take 1 tablet (325 mg total) by mouth daily with breakfast. 30 tablet 3   lamoTRIgine (LAMICTAL) 100 MG tablet Take 1 tablet (100 mg total) by mouth 2 (two) times daily. 180 tablet 4   medroxyPROGESTERone (DEPO-PROVERA) 150 MG/ML injection ADMINISTER 1 ML IN THE MUSCLE  EVERY 11 TO 12 WEEKS     potassium chloride SA (KLOR-CON M) 20 MEQ tablet Take 1 tablet (20 mEq total) by mouth 2 (two) times daily. For low potassium. (Patient not taking: Reported on 11/08/2021) 10 tablet 0   [DISCONTINUED] levETIRAcetam (KEPPRA) 500 MG tablet Take 1 tablet (500 mg total) by mouth daily. 14 tablet 0   No current facility-administered medications on file prior to visit.    BP 122/76   Temp 98.1 F (36.7 C) (Temporal)   Ht _0  (1.676 m)   Wt 162 lb (73.5 kg)   BMI 26.15 kg/m  Objective:   Physical Exam Cardiovascular:     Rate and Rhythm: Normal rate and regular rhythm.  Pulmonary:     Effort: Pulmonary effort is normal.     Breath sounds: Normal breath sounds.  Musculoskeletal:     Cervical back: Neck supple.  Skin:    General: Skin is warm and dry.           Assessment & Plan:   Problem List Items Addressed This Visit       Endocrine   Type 2 diabetes mellitus with hyperglycemia ([place]) - Primary    New onset with A1c of 12.9! Last A1c was in May 2023 which was 5.4.  Unclear trigger, but according to chart review it appears that her glucose readings changed from August to October 2023.  We had a long discussion today regarding her diet which included elimination of sweets and carb heavy meals.  She will increase fresh fruit vegetables, lean protein, and choose whole grains.  Handout regarding diabetes nutrition provided.    She is very motivated to change her diet so we will hold off on insulin at this time.  Start with metformin XR 500 mg daily x 2 weeks, then increase to 2 tablets daily thereafter.  She would like to hold off on a second agent at this time.  Prescription for glucometer kit sent to pharmacy.  We also discussed how to check glucose levels and appropriate times check. Blank glucose log provided today.  Will plan to see her back in the office in 1 month with her glucose logs.  Would like to test for type 1 diabetes, however we need  to have her fasting glucose level below 180.  She is not fasting today.  We will need to test C-peptide, isolate cell antibodies, and ZnT8 antibodies.        Relevant Medications   metFORMIN (GLUCOPHAGE-XR) 500 MG 24 hr tablet   blood glucose meter kit and supplies KIT       Pleas Koch, NP  40 minutes spent with patient  discussing new onset diabetes, nutrition, glucometer education, treatment plan.  See assessment plan.

## 2022-03-13 DIAGNOSIS — E1165 Type 2 diabetes mellitus with hyperglycemia: Secondary | ICD-10-CM

## 2022-03-14 MED ORDER — GLIPIZIDE ER 10 MG PO TB24: 10.0000 mg | ORAL_TABLET | Freq: Every day | ORAL | 0 refills | Status: DC

## 2022-03-16 ENCOUNTER — Other Ambulatory Visit: Payer: Self-pay | Admitting: Primary Care

## 2022-03-16 DIAGNOSIS — I10 Essential (primary) hypertension: Secondary | ICD-10-CM

## 2022-03-21 MED ORDER — GLUCOSE BLOOD VI STRP: ORAL_STRIP | 5 refills | Status: DC

## 2022-04-10 ENCOUNTER — Ambulatory Visit (INDEPENDENT_AMBULATORY_CARE_PROVIDER_SITE_OTHER): Payer: 59 | Admitting: Primary Care

## 2022-04-10 ENCOUNTER — Encounter: Payer: Self-pay | Admitting: Primary Care

## 2022-04-10 VITALS — BP 128/82 | HR 90 | Temp 97.9°F | Ht 66.0 in | Wt 162.0 lb

## 2022-04-10 DIAGNOSIS — E1165 Type 2 diabetes mellitus with hyperglycemia: Secondary | ICD-10-CM

## 2022-04-10 MED ORDER — METFORMIN HCL ER 500 MG PO TB24: 1000.0000 mg | ORAL_TABLET | Freq: Two times a day (BID) | ORAL | 0 refills | Status: DC

## 2022-04-10 NOTE — Assessment & Plan Note (Addendum)
Some improvement in home blood sugar readings however, glucose checks inconsistent at times.   Discussed with patient about the best times to check blood sugars and continue keeping a log.    Increase Metformin to 1000 mg BID and continue glipizide XL 10 mg daily.   Discussed with patient about the importance of exercise and watching her diet.  Agree with patient for a nutrition referral, referral placed.  Follow up in 2 months.   I evaluated patient, was consulted regarding treatment, and agree with assessment and plan per Tinnie Gens, RN, DNP student.   Allie Bossier, NP-C

## 2022-04-10 NOTE — Progress Notes (Signed)
Established Patient Office Visit  Subjective   Patient ID: Melissa Chapman, female    DOB: 02/15/1986  Age: 37 y.o. MRN: [id number]  Chief Complaint  Patient presents with   Follow-up    HPI  Beatriz Quintela is a 37 year old female with past medical history of hypertension, type 2 diabetes, seizure disorder, chronic anemia presents today for a diabetes follow up.   Current medications include: Metformin XR 500 mg BID, Glipizide 10 mg once daily.   She is checking her blood glucose 2 times daily and is getting readings of 350s-upper 250s.  Last A1C: 12.5 on 03/08/22. Last Eye Exam: Due Last Foot Exam: Due Pneumonia Vaccination: Due Urine Microalbumin: Up to date. Statin: none  Dietary changes since last visit: Avoiding sugars. Still eating pasta. Unsure of how to make dietary changes. Interested in nutrition referral. Has been experiencing excessive hunger.    Exercise: none   Patient Active Problem List   Diagnosis Date Noted   Chronic knee pain 08/08/2021   Vitamin B 12 deficiency 09/15/2020   Chronic anemia 09/13/2020   Syncope 07/27/2019   Type 2 diabetes mellitus with hyperglycemia ([place]) 07/27/2019   Seizure disorder ([place]) 07/27/2019   Tachycardia 07/27/2019   Chronic hypokalemia 10/11/2017   Preventative health care 10/11/2017   Hypertriglyceridemia 10/11/2017   Elevated bilirubin 11/21/2016   Migraine without status migrainosus, not intractable 11/15/2016   Essential hypertension 10/22/2016   Anxiety and depression 10/22/2016   Past Medical History:  Diagnosis Date   Anxiety and depression    Chronic hypokalemia    Essential hypertension    Pancreatitis    Seizures ([place])    last seizure 09/02/20   Syncope    most recent March 2021   Viral gastroenteritis 06/20/2021   History reviewed. No pertinent surgical history. Social History   Tobacco Use   Smoking status: Every Day    Packs/day: 1.00    Years: 13.00    Total pack years: 13.00    Types:  Cigarettes   Smokeless tobacco: Never  Substance Use Topics   Alcohol use: Yes    Comment: socially   Drug use: No   Family History  Problem Relation Age of Onset   Depression Mother    Hypertension Mother    Hyperlipidemia Mother    Hypertension Father    Diabetes Father    Hyperlipidemia Father    Lung cancer Father        smoker   Depression Sister    Cancer Paternal Grandmother    No Known Allergies    Review of Systems  Constitutional:  Negative for fever.  Eyes:  Negative for blurred vision.  Respiratory:  Negative for shortness of breath.   Cardiovascular:  Negative for chest pain.  Genitourinary:  Negative for frequency and urgency.  Neurological:  Negative for dizziness.  Endo/Heme/Allergies:  Positive for polydipsia.      Objective:     BP 128/82   Pulse 90   Temp 97.9 F (36.6 C) (Temporal)   Ht 5\' 6"  (1.676 m)   Wt 162 lb (73.5 kg)   SpO2 98%   BMI 26.15 kg/m  BP Readings from Last 3 Encounters:  04/10/22 128/82  03/08/22 122/76  01/16/22 122/77   Wt Readings from Last 3 Encounters:  04/10/22 162 lb (73.5 kg)  03/08/22 162 lb (73.5 kg)  01/16/22 160 lb 0.6 oz (72.6 kg)      Physical Exam Vitals and nursing note reviewed.  Constitutional:  Appearance: Normal appearance.  Cardiovascular:     Rate and Rhythm: Normal rate and regular rhythm.     Pulses: Normal pulses.     Heart sounds: Normal heart sounds.  Pulmonary:     Effort: Pulmonary effort is normal.     Breath sounds: Normal breath sounds.  Neurological:     Mental Status: She is alert and oriented to person, place, and time.  Psychiatric:        Mood and Affect: Mood normal.        Behavior: Behavior normal.      No results found for any visits on 04/10/22.     The ASCVD Risk score (Arnett DK, et al., 2019) failed to calculate for the following reasons:   The 2019 ASCVD risk score is only valid for ages 19 to 42    Assessment & Plan:   Problem List Items  Addressed This Visit       Endocrine   Type 2 diabetes mellitus with hyperglycemia ([facility]) - Primary    Some improvement in home blood sugar readings however, they are not at consistent times.   Discussed with patient about the best times to check blood sugars and continue keeping a log.    Increase Metformin to 1000 mg BID and glipizide 10 mg daily.   Discussed with patient about the importance of exercise and watching her diet. Agree with patient for a nutrition referral.   Follow up in 2 months.       Relevant Medications   metFORMIN (GLUCOPHAGE-XR) 500 MG 24 hr tablet   Other Relevant Orders   Referral to Nutrition and Diabetes Services    Return in about 2 months (around 06/09/2022) for Follow up for Diabetes.    Modesto Charon, BSN-RN, DNP STUDENT

## 2022-04-10 NOTE — Progress Notes (Signed)
Subjective:    Patient ID: Melissa Chapman, female    DOB: [birth year], 37 y.o.   MRN: [id number]  HPI  Melissa Chapman is a very pleasant 37 y.o. female  has a past medical history of Anxiety and depression, Chronic hypokalemia, Essential hypertension, Pancreatitis, Seizures ([facility]), Syncope, and Viral gastroenteritis (06/20/2021). who presents today for follow up of diabetes.  Current medications include: Metformin XR 1000 mg daily, Glipizide XL 10 mg daily.   She is checking her blood glucose 1-2 times daily and is getting readings ranging mid to high 200's to mid 300's.   Last A1C: 12.9 in December 2023, 5.4 in May 2023 Last Eye Exam: Due Last Foot Exam: Due Pneumonia Vaccination: Never completed  Urine Microalbumin: UTD Statin: None, child bearing age.  Dietary changes since last visit: Working on cutting back sugar. Continues with excessive hunger.    Exercise: None  BP Readings from Last 3 Encounters:  04/10/22 128/82  03/08/22 122/76  01/16/22 122/77         Review of Systems  Respiratory:  Negative for shortness of breath.   Cardiovascular:  Negative for chest pain.  Endocrine: Positive for polyphagia. Negative for polydipsia and polyuria.  Neurological:  Negative for numbness.         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    1 son.   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   Social Determinants of Health   Financial Resource Strain: Not on file  Food Insecurity: Not on file  Transportation Needs: Not on file  Physical Activity: Not on file  Stress: Not on file  Social Connections: Not on file  Intimate Partner Violence: Not on file    History reviewed. No pertinent surgical history.  Family History  Problem Relation Age of Onset   Depression Mother    Hypertension Mother    Hyperlipidemia Mother    Hypertension Father    Diabetes Father    Hyperlipidemia Father    Lung cancer Father        smoker   Depression Sister    Cancer Paternal Grandmother     No Known Allergies  Current Outpatient Medications on File Prior to Visit  Medication Sig Dispense Refill   amLODipine (NORVASC) 10 MG tablet TAKE 1 TABLET BY MOUTH ONCE DAILY FOR BLOOD PRESSURE 90 tablet 2   blood glucose meter kit and supplies KIT Dispense based on  patient and insurance preference. Use up to four times daily as directed. (FOR ICD-9 250.00, 250.01). 1 each 0   escitalopram (LEXAPRO) 10 MG tablet TAKE 1 TABLET BY MOUTH DAILY, FOR ANXIETY 90 tablet 2   glipiZIDE (GLUCOTROL XL) 10 MG 24 hr tablet Take 1 tablet (10 mg total) by mouth daily with breakfast. for diabetes. 90 tablet 0   glucose blood test strip Use as instructed to check blood glucose levels 3-4 times daily. 300 each 5   lamoTRIgine (LAMICTAL) 100 MG tablet Take 1 tablet (100 mg total) by mouth 2 (two) times daily. 180 tablet 4   medroxyPROGESTERone (DEPO-PROVERA) 150 MG/ML injection ADMINISTER 1 ML IN THE MUSCLE EVERY 11 TO 12 WEEKS     Multiple Vitamin (MULTIVITAMIN ADULT PO) Take by mouth.     cyanocobalamin 1000 MCG tablet Take 1,000 mcg by mouth  daily. (Patient not taking: Reported on 04/10/2022)     ferrous sulfate 325 (65 FE) MG EC tablet Take 1 tablet (325 mg total) by mouth daily with breakfast. (Patient not taking: Reported on 04/10/2022) 30 tablet 3   potassium chloride SA (KLOR-CON M) 20 MEQ tablet Take 1 tablet (20 mEq total) by mouth 2 (two) times daily. For low potassium. (Patient not taking: Reported on 11/08/2021) 10 tablet 0   [DISCONTINUED] levETIRAcetam (KEPPRA) 500 MG tablet Take 1 tablet (500 mg total) by mouth daily. 14 tablet 0   No current facility-administered medications on file prior to visit.    BP 128/82   Pulse 90   Temp 97.9 F (36.6 C) (Temporal)   Ht 5\' 6"  (1.676 m)   Wt 162 lb (73.5 kg)   SpO2 98%   BMI 26.15 kg/m  Objective:   Physical Exam Cardiovascular:     Rate and Rhythm: Normal rate and regular rhythm.  Pulmonary:     Effort: Pulmonary effort is normal.     Breath sounds: Normal breath sounds.  Musculoskeletal:     Cervical back: Neck supple.  Skin:    General: Skin is warm and dry.           Assessment & Plan:  Type 2 diabetes mellitus with hyperglycemia, without long-term current use of insulin ([facility]) Assessment & Plan: Some improvement in home blood sugar readings however, glucose checks inconsistent at times.   Discussed with patient about the best times to check blood sugars and continue keeping a log.    Increase Metformin to 1000 mg BID and continue glipizide XL 10 mg daily.   Discussed with patient about the importance of exercise and watching her diet.  Agree with patient for a nutrition referral, referral placed.  Follow up in 2 months.   I evaluated patient, was consulted regarding treatment, and agree with assessment and plan per Tinnie Gens, RN, DNP student.   Allie Bossier, NP-C   Orders: -     Referral to Nutrition and Diabetes Services -     metFORMIN HCl ER; Take 2 tablets (1,000 mg total) by mouth 2 (two) times daily with a meal. for diabetes.  Dispense:  360 tablet; Refill: 0        Pleas Koch, NP

## 2022-04-10 NOTE — Patient Instructions (Addendum)
You will either be contacted via phone regarding your referral to the nutritionist, or you may receive a letter on your MyChart portal from our referral team with instructions for scheduling an appointment. Please let us know if you have not been contacted by anyone within two weeks.  Increase your metformin XR to 2 tablets in the morning and 2 tablets in the evening for diabetes.  Continue your Glipizide XL 10 mg daily for diabetes.  Please keep me updated regarding your diabetes levels.   Schedule a follow up visit for 2 months.   It was a pleasure to see you today!

## 2022-04-16 ENCOUNTER — Other Ambulatory Visit: Payer: Self-pay | Admitting: Physician Assistant

## 2022-04-16 DIAGNOSIS — E611 Iron deficiency: Secondary | ICD-10-CM

## 2022-04-17 ENCOUNTER — Inpatient Hospital Stay: Payer: 59 | Attending: Primary Care

## 2022-04-17 ENCOUNTER — Inpatient Hospital Stay: Payer: 59 | Admitting: Physician Assistant

## 2022-04-18 DIAGNOSIS — E1165 Type 2 diabetes mellitus with hyperglycemia: Secondary | ICD-10-CM

## 2022-04-18 MED ORDER — ONETOUCH DELICA LANCETS 30G MISC: 1 refills | Status: DC

## 2022-05-03 ENCOUNTER — Encounter: Payer: Self-pay | Admitting: Diagnostic Neuroimaging

## 2022-05-10 ENCOUNTER — Other Ambulatory Visit: Payer: Self-pay | Admitting: Primary Care

## 2022-05-10 ENCOUNTER — Other Ambulatory Visit: Payer: Self-pay

## 2022-05-10 DIAGNOSIS — E1165 Type 2 diabetes mellitus with hyperglycemia: Secondary | ICD-10-CM

## 2022-05-10 MED ORDER — GLIPIZIDE ER 5 MG PO TB24: 5.0000 mg | ORAL_TABLET | Freq: Every day | ORAL | 0 refills | Status: DC

## 2022-05-10 MED ORDER — METFORMIN HCL ER 500 MG PO TB24
ORAL_TABLET | ORAL | 0 refills | Status: DC
  Filled 2022-05-10: qty 270, 90d supply, fill #0

## 2022-05-10 NOTE — Telephone Encounter (Signed)
From: [name] To: Office of Pleas Koch, NP Sent: 05/10/2022 10:34 AM EST Subject: Medication Renewal Request  Refills have been requested for the following medications:   metFORMIN (GLUCOPHAGE-XR) 500 MG 24 hr tablet [Markeda Narvaez K Meri Pelot]  Preferred pharmacy: [facility] Delivery method: Arlyss Gandy

## 2022-05-11 ENCOUNTER — Other Ambulatory Visit: Payer: Self-pay

## 2022-05-11 ENCOUNTER — Other Ambulatory Visit: Payer: Self-pay | Admitting: Primary Care

## 2022-05-11 DIAGNOSIS — E1165 Type 2 diabetes mellitus with hyperglycemia: Secondary | ICD-10-CM

## 2022-05-11 MED ORDER — METFORMIN HCL ER 500 MG PO TB24: ORAL_TABLET | ORAL | 0 refills | Status: DC

## 2022-05-21 ENCOUNTER — Ambulatory Visit: Payer: 59 | Admitting: Registered"

## 2022-06-12 LAB — HM PAP SMEAR: HPV, high-risk: NEGATIVE

## 2022-06-19 ENCOUNTER — Ambulatory Visit (INDEPENDENT_AMBULATORY_CARE_PROVIDER_SITE_OTHER): Payer: 59 | Admitting: Primary Care

## 2022-06-19 ENCOUNTER — Encounter: Payer: Self-pay | Admitting: Primary Care

## 2022-06-19 VITALS — BP 124/86 | HR 105 | Temp 97.2°F | Ht 66.0 in | Wt 167.0 lb

## 2022-06-19 DIAGNOSIS — E1165 Type 2 diabetes mellitus with hyperglycemia: Secondary | ICD-10-CM

## 2022-06-19 LAB — POCT GLYCOSYLATED HEMOGLOBIN (HGB A1C): Hemoglobin A1C: 6.7 % — AB (ref 4.0–5.6)

## 2022-06-19 NOTE — Assessment & Plan Note (Addendum)
Controlled with significant improvement with hemoglobin A1C reading of 6.7!!!  Commended her on diet changes.   Continue Glipizide XL 5 mg daily and Metformin XR 1000 mg in AM and 500 mg in PM.   Foot exam completed today.  Discussed with patient to continue monitoring her diet and importance of start exercise.   Follow up in three months   I evaluated patient, was consulted regarding treatment, and agree with assessment and plan per Tinnie Gens, RN, DNP student.   Allie Bossier, NP-C

## 2022-06-19 NOTE — Progress Notes (Signed)
Subjective:    Patient ID: Melissa Chapman, female    DOB: October 21, 1985, 37 y.o.   MRN: [id number]  HPI  Melissa Chapman is a very pleasant 37 y.o. female with a history of new onset type 2 diabetes as of December 2023 who presents today for follow up of diabetes.  Current medications include: Glipizide XL 5 mg daily, metformin XR 1000 mg in AM and 500 mg in PM.   She is checking her blood glucose 1-3 times daily and is getting readings of:  AM fasting: low to mid 100's Post prandial: high 100's to low 200's  Last A1C: 12.9 in December 2023, 6.7 today Last Eye Exam: Due Last Foot Exam: Due Pneumonia Vaccination: Never completed  Urine Microalbumin: UTD Statin: None. Child bearing age.  Dietary changes since last visit: Increased intake of salads, switched to whole wheat pasta, increased water intake. She's cut out sugary drinks and foods.    Exercise: None      Review of Systems  Constitutional:  Negative for fatigue.  Gastrointestinal:  Positive for diarrhea and nausea.       Occasional nausea and diarrhea   Endocrine: Negative for polydipsia, polyphagia and polyuria.  Neurological:  Negative for numbness.         Past Medical History:  Diagnosis Date   Anxiety and depression    Chronic hypokalemia    Essential hypertension    Pancreatitis    Seizures ([place])    last seizure 09/02/20   Syncope    most recent March 2021   Viral gastroenteritis 06/20/2021    Social History   Socioeconomic History   Marital status: Single    Spouse name: Not on file   Number of children: 1   Years of education: Not on file   Highest education level: Bachelor's degree (e.g., BA, AB, BS)  Occupational History    Comment: RN [employer]  Tobacco Use   Smoking status: Every Day    Packs/day: 1.00    Years: 13.00    Additional pack years: 0.00    Total pack years: 13.00    Types: Cigarettes   Smokeless tobacco: Never  Substance and Sexual Activity   Alcohol use: Yes     Comment: socially   Drug use: No   Sexual activity: Yes  Other Topics Concern   Not on file  Social History Narrative   Single.    1 son.   Works as a Marine scientist in [employer].    Enjoys watching movies, swimming.   Social Determinants of Health   Financial Resource Strain: Not on file  Food Insecurity: Not on file  Transportation Needs: Not on file  Physical Activity: Not on file  Stress: Not on file  Social Connections: Not on file  Intimate Partner Violence: Not on file    History reviewed. No pertinent surgical history.  Family History  Problem Relation Age of Onset   Depression Mother    Hypertension Mother    Hyperlipidemia Mother    Hypertension Father    Diabetes Father    Hyperlipidemia Father    Lung cancer Father        smoker   Depression Sister    Cancer Paternal Grandmother     No Known Allergies  Current Outpatient Medications on File Prior to Visit  Medication Sig Dispense Refill   amLODipine (NORVASC) 10 MG tablet TAKE 1 TABLET BY MOUTH ONCE DAILY FOR BLOOD PRESSURE 90 tablet 2   blood glucose meter kit  and supplies KIT Dispense based on patient and insurance preference. Use up to four times daily as directed. (FOR ICD-9 250.00, 250.01). 1 each 0   escitalopram (LEXAPRO) 10 MG tablet TAKE 1 TABLET BY MOUTH DAILY, FOR ANXIETY 90 tablet 2   glipiZIDE (GLUCOTROL XL) 5 MG 24 hr tablet Take 1 tablet (5 mg total) by mouth daily with breakfast. for diabetes. 90 tablet 0   glucose blood test strip Use as instructed to check blood glucose levels 3-4 times daily. 300 each 5   lamoTRIgine (LAMICTAL) 100 MG tablet Take 1 tablet (100 mg total) by mouth 2 (two) times daily. 180 tablet 4   medroxyPROGESTERone (DEPO-PROVERA) 150 MG/ML injection ADMINISTER 1 ML IN THE MUSCLE EVERY 11 TO 12 WEEKS     metFORMIN (GLUCOPHAGE-XR) 500 MG 24 hr tablet Take 2 tablets (1,000 mg total) by mouth daily with breakfast AND 1 tablet (500 mg total) every evening. 270 tablet 0   Multiple  Vitamin (MULTIVITAMIN ADULT PO) Take by mouth.     OneTouch Delica Lancets 99991111 MISC Use up to 4 times daily for blood sugar checks 400 each 1   cyanocobalamin 1000 MCG tablet Take 1,000 mcg by mouth daily. (Patient not taking: Reported on 04/10/2022)     ferrous sulfate 325 (65 FE) MG EC tablet Take 1 tablet (325 mg total) by mouth daily with breakfast. (Patient not taking: Reported on 04/10/2022) 30 tablet 3   potassium chloride SA (KLOR-CON M) 20 MEQ tablet Take 1 tablet (20 mEq total) by mouth 2 (two) times daily. For low potassium. (Patient not taking: Reported on 11/08/2021) 10 tablet 0   [DISCONTINUED] levETIRAcetam (KEPPRA) 500 MG tablet Take 1 tablet (500 mg total) by mouth daily. 14 tablet 0   No current facility-administered medications on file prior to visit.    BP 124/86   Pulse (!) 105   Temp (!) 97.2 F (36.2 C) (Temporal)   Ht 5\' 6"  (1.676 m)   Wt 167 lb (75.8 kg)   SpO2 100%   BMI 26.95 kg/m  Objective:   Physical Exam Cardiovascular:     Rate and Rhythm: Normal rate and regular rhythm.  Pulmonary:     Effort: Pulmonary effort is normal.     Breath sounds: Normal breath sounds.  Musculoskeletal:     Cervical back: Neck supple.  Skin:    General: Skin is warm and dry.           Assessment & Plan:  Type 2 diabetes mellitus with hyperglycemia, without long-term current use of insulin (HCC) Assessment & Plan: Controlled with significant improvement with hemoglobin A1C reading of 6.7!!!  Commended her on diet changes.   Continue Glipizide XL 5 mg daily and Metformin XR 1000 mg in AM and 500 mg in PM.   Foot exam completed today.  Discussed with patient to continue monitoring her diet and importance of start exercise.   Follow up in three months   I evaluated patient, was consulted regarding treatment, and agree with assessment and plan per Tinnie Gens, RN, DNP student.   Allie Bossier, NP-C   Orders: -     POCT glycosylated hemoglobin (Hb  A1C)        Pleas Koch, NP

## 2022-06-19 NOTE — Patient Instructions (Signed)
Great job with your dietary changes.   Continue Glipizide 5 mg daily and Metformin XR 1000 mg in AM and 500 mg in PM.   Please update if you consistently get readings below 70.   Follow up in three months.   It was a pleasure to see you today!

## 2022-06-19 NOTE — Progress Notes (Signed)
Established Patient Office Visit  Subjective   Patient ID: Melissa Chapman, female    DOB: 1986-03-02  Age: 37 y.o. MRN: [id number]  Chief Complaint  Patient presents with   Medical Management of Chronic Issues    2 mo dm f/u    HPI  Melissa Chapman is a 37 year old female with past medical history of type 2 diabetes, hypertension, seizure disorder, anxiety and depression presents today for a diabetes follow up.   Current medications include: Glipizide 5 mg and Metformin XR 1000 mg in AM and 500 mg in PM.  She does have occasional nausea and diarrhea but overall tolerating it well.   She is checking her blood glucose 3-4 times daily and is getting readings of 130s-190s with three readings in mid 200s. Post prandial readings average between mid to upper 200s-mid130s.  Last A1C: 12.9 on March 08, 2022. Last Eye Exam: Due Last Foot Exam: Due Pneumonia Vaccination: Due, declines Urine Microalbumin: Up to date Statin: none  Dietary changes since last visit:She is eating wheat pasta, eating salads, avoids sweets and desserts and sweet beverages. She keeps sugar free items at home. She has been drinking a lot of water.   Exercise: None   Patient Active Problem List   Diagnosis Date Noted   Chronic knee pain 08/08/2021   Vitamin B 12 deficiency 09/15/2020   Chronic anemia 09/13/2020   Syncope 07/27/2019   Type 2 diabetes mellitus with hyperglycemia ([place]) 07/27/2019   Seizure disorder ([place]) 07/27/2019   Tachycardia 07/27/2019   Chronic hypokalemia 10/11/2017   Preventative health care 10/11/2017   Hypertriglyceridemia 10/11/2017   Elevated bilirubin 11/21/2016   Migraine without status migrainosus, not intractable 11/15/2016   Essential hypertension 10/22/2016   Anxiety and depression 10/22/2016   Past Medical History:  Diagnosis Date   Anxiety and depression    Chronic hypokalemia    Essential hypertension    Pancreatitis    Seizures ([place])    last seizure 09/02/20    Syncope    most recent March 2021   Viral gastroenteritis 06/20/2021   History reviewed. No pertinent surgical history. Social History   Tobacco Use   Smoking status: Every Day    Packs/day: 1.00    Years: 13.00    Additional pack years: 0.00    Total pack years: 13.00    Types: Cigarettes   Smokeless tobacco: Never  Substance Use Topics   Alcohol use: Yes    Comment: socially   Drug use: No   Family History  Problem Relation Age of Onset   Depression Mother    Hypertension Mother    Hyperlipidemia Mother    Hypertension Father    Diabetes Father    Hyperlipidemia Father    Lung cancer Father        smoker   Depression Sister    Cancer Paternal Grandmother    No Known Allergies    Review of Systems  Constitutional:  Negative for chills and fever.  Respiratory:  Negative for shortness of breath.   Cardiovascular:  Negative for chest pain.  Gastrointestinal:  Positive for diarrhea and nausea. Negative for abdominal pain.       Occasional diarrhea and nausea.  Neurological:  Negative for dizziness, tingling and headaches.  Endo/Heme/Allergies:  Negative for polydipsia.      Objective:     BP 124/86   Pulse (!) 105   Temp (!) 97.2 F (36.2 C) (Temporal)   Ht 5\' 6"  (1.676 m)  Wt 167 lb (75.8 kg)   SpO2 100%   BMI 26.95 kg/m  BP Readings from Last 3 Encounters:  06/19/22 124/86  04/10/22 128/82  03/08/22 122/76   Wt Readings from Last 3 Encounters:  06/19/22 167 lb (75.8 kg)  04/10/22 162 lb (73.5 kg)  03/08/22 162 lb (73.5 kg)      Physical Exam Vitals and nursing note reviewed.  Constitutional:      Appearance: Normal appearance.  Cardiovascular:     Rate and Rhythm: Normal rate and regular rhythm.     Pulses: Normal pulses.     Heart sounds: Normal heart sounds.  Pulmonary:     Effort: Pulmonary effort is normal.     Breath sounds: Normal breath sounds.  Neurological:     Mental Status: She is alert and oriented to person, place, and  time.  Psychiatric:        Mood and Affect: Mood normal.        Behavior: Behavior normal.      Results for orders placed or performed in visit on 06/19/22  POCT glycosylated hemoglobin (Hb A1C)  Result Value Ref Range   Hemoglobin A1C 6.7 (A) 4.0 - 5.6 %   HbA1c POC (<> result, manual entry)     HbA1c, POC (prediabetic range)     HbA1c, POC (controlled diabetic range)         The ASCVD Risk score (Arnett DK, et al., 2019) failed to calculate for the following reasons:   The 2019 ASCVD risk score is only valid for ages 39 to 61    Assessment & Plan:   Problem List Items Addressed This Visit       Endocrine   Type 2 diabetes mellitus with hyperglycemia ([place]) - Primary    Controlled with hemoglobin A1C reading of 6.7.   Commended her on diet changes.   Continue Glipizide 5 mg daily and Metformin XR 1000 mg in AM and 500 mg in PM.   Foot exam completed today.  Discussed with patient to continue monitoring her diet and importance of start exercise.   Follow up in three months      Relevant Orders   POCT glycosylated hemoglobin (Hb A1C) (Completed)    No follow-ups on file.    Tinnie Gens, BSN-RN, DNP STUDENT

## 2022-07-18 ENCOUNTER — Other Ambulatory Visit: Payer: Self-pay

## 2022-07-18 DIAGNOSIS — F419 Anxiety disorder, unspecified: Secondary | ICD-10-CM

## 2022-07-18 DIAGNOSIS — E1165 Type 2 diabetes mellitus with hyperglycemia: Secondary | ICD-10-CM

## 2022-07-18 MED ORDER — LAMOTRIGINE 100 MG PO TABS: 100.0000 mg | ORAL_TABLET | Freq: Two times a day (BID) | ORAL | 2 refills | Status: DC

## 2022-07-18 NOTE — Addendum Note (Signed)
Addended by: Doreene Nest on: 07/18/2022 04:03 PM   Modules accepted: Orders

## 2022-07-18 NOTE — Telephone Encounter (Signed)
Please clarify with patient which pharmacy we are using. She uses [facility] but the refill request came from [facility].

## 2022-07-18 NOTE — Telephone Encounter (Signed)
Received refill request for  

## 2022-07-18 NOTE — Telephone Encounter (Signed)
Unable to reach patient. Left voicemail to return call to our office.   

## 2022-07-20 NOTE — Telephone Encounter (Signed)
Noted  

## 2022-07-20 NOTE — Telephone Encounter (Signed)
Called patient she states she does not need a refill on any of these medication currently. [facility] time she is in need of refills. She will let us know when she is 1-2 weeks from running out of medication so we can send in.

## 2022-08-03 ENCOUNTER — Other Ambulatory Visit: Payer: Self-pay

## 2022-08-03 DIAGNOSIS — F32A Depression, unspecified: Secondary | ICD-10-CM

## 2022-08-03 DIAGNOSIS — E1165 Type 2 diabetes mellitus with hyperglycemia: Secondary | ICD-10-CM

## 2022-08-03 DIAGNOSIS — I10 Essential (primary) hypertension: Secondary | ICD-10-CM

## 2022-08-03 MED ORDER — ONETOUCH DELICA LANCETS 30G MISC
1 refills | Status: AC
Start: 2022-08-03 — End: ?

## 2022-08-03 MED ORDER — METFORMIN HCL ER 500 MG PO TB24
ORAL_TABLET | ORAL | 0 refills | Status: DC
Start: 2022-08-03 — End: 2022-10-22

## 2022-08-03 MED ORDER — GLIPIZIDE ER 5 MG PO TB24
5.0000 mg | ORAL_TABLET | Freq: Every day | ORAL | 0 refills | Status: DC
Start: 2022-08-03 — End: 2022-09-24

## 2022-08-03 MED ORDER — ESCITALOPRAM OXALATE 10 MG PO TABS
ORAL_TABLET | ORAL | 0 refills | Status: DC
Start: 2022-08-03 — End: 2022-12-02

## 2022-08-03 NOTE — Telephone Encounter (Signed)
Pharmacy request refills for:  amLODipine (NORVASC) 10 MG tablet ; escitalopram (LEXAPRO) 10 MG tablet ; glipiZIDE (GLUCOTROL XL) 5 MG 24 hr tablet ; OneTouch Delica Lancets 30G MISC ; and metFORMIN (GLUCOPHAGE-XR) 500 MG 24 hr tablet   LV- 06/19/22 NV- 09/19/22

## 2022-09-05 ENCOUNTER — Other Ambulatory Visit: Payer: Self-pay | Admitting: Primary Care

## 2022-09-05 DIAGNOSIS — E1165 Type 2 diabetes mellitus with hyperglycemia: Secondary | ICD-10-CM

## 2022-09-05 NOTE — Telephone Encounter (Signed)
From: Orma Flaming To: Office of Doreene Nest, NP Sent: 09/05/2022 9:25 AM EDT Subject: Medication Renewal Request  Refills have been requested for the following medications:   glipiZIDE (GLUCOTROL XL) 5 MG 24 hr tablet [Roye Gustafson K Gunner Iodice]  Preferred pharmacy: [facility] [phone] - [place], [place] - [address] Delivery method: [employer]

## 2022-09-19 ENCOUNTER — Ambulatory Visit: Payer: 59 | Admitting: Primary Care

## 2022-09-24 ENCOUNTER — Ambulatory Visit (INDEPENDENT_AMBULATORY_CARE_PROVIDER_SITE_OTHER): Payer: Commercial Managed Care - HMO | Admitting: Primary Care

## 2022-09-24 ENCOUNTER — Encounter: Payer: Self-pay | Admitting: Primary Care

## 2022-09-24 VITALS — BP 122/68 | HR 120 | Temp 98.2°F | Ht 66.0 in | Wt 168.0 lb

## 2022-09-24 DIAGNOSIS — E1165 Type 2 diabetes mellitus with hyperglycemia: Secondary | ICD-10-CM

## 2022-09-24 DIAGNOSIS — D649 Anemia, unspecified: Secondary | ICD-10-CM | POA: Diagnosis not present

## 2022-09-24 DIAGNOSIS — G43909 Migraine, unspecified, not intractable, without status migrainosus: Secondary | ICD-10-CM

## 2022-09-24 DIAGNOSIS — E876 Hypokalemia: Secondary | ICD-10-CM

## 2022-09-24 DIAGNOSIS — F32A Depression, unspecified: Secondary | ICD-10-CM

## 2022-09-24 DIAGNOSIS — E781 Pure hyperglyceridemia: Secondary | ICD-10-CM | POA: Diagnosis not present

## 2022-09-24 DIAGNOSIS — I10 Essential (primary) hypertension: Secondary | ICD-10-CM | POA: Diagnosis not present

## 2022-09-24 DIAGNOSIS — F419 Anxiety disorder, unspecified: Secondary | ICD-10-CM

## 2022-09-24 DIAGNOSIS — Z0000 Encounter for general adult medical examination without abnormal findings: Secondary | ICD-10-CM | POA: Diagnosis not present

## 2022-09-24 DIAGNOSIS — G40909 Epilepsy, unspecified, not intractable, without status epilepticus: Secondary | ICD-10-CM

## 2022-09-24 DIAGNOSIS — Z7984 Long term (current) use of oral hypoglycemic drugs: Secondary | ICD-10-CM

## 2022-09-24 LAB — POCT GLYCOSYLATED HEMOGLOBIN (HGB A1C): Hemoglobin A1C: 5.6 % (ref 4.0–5.6)

## 2022-09-24 NOTE — Assessment & Plan Note (Signed)
Controlled.  Amlodipine 10 mg daily. Continue to monitor.

## 2022-09-24 NOTE — Progress Notes (Signed)
Subjective:    Patient ID: Melissa Chapman, female    DOB: [birth year], 37 y.o.   MRN: [id number]  HPI  Melissa Chapman is a very pleasant 37 y.o. female who presents today for complete physical and follow up of chronic conditions.  Immunizations: -Tetanus: Completed in 2021 -Pneumonia: Never completed  Diet: Fair diet.  Exercise: No regular exercise.  Eye exam: Completes annually  Dental exam: Completes semi-annually    Pap Smear: 2024  BP Readings from Last 3 Encounters:  09/24/22 122/68  06/19/22 124/86  04/10/22 128/82       Review of Systems  Constitutional:  Negative for unexpected weight change.  HENT:  Negative for rhinorrhea.   Respiratory:  Negative for cough and shortness of breath.   Cardiovascular:  Negative for chest pain.  Gastrointestinal:  Negative for constipation and diarrhea.  Genitourinary:  Negative for difficulty urinating.  Musculoskeletal:  Negative for arthralgias and myalgias.  Skin:  Negative for rash.  Allergic/Immunologic: Negative for environmental allergies.  Neurological:  Negative for dizziness, numbness and headaches.  Psychiatric/Behavioral:  The patient is not nervous/anxious.          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    1 son.   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   Social Determinants of Health   Financial Resource Strain: Not on file  Food Insecurity: Not on file  Transportation Needs: Not on file  Physical Activity: Not on file  Stress: Not on file  Social Connections: Not on file  Intimate Partner Violence: Not on file    History reviewed. No pertinent surgical history.  Family History  Problem Relation Age of Onset   Depression Mother    Hypertension Mother    Hyperlipidemia Mother    Hypertension Father    Diabetes Father    Hyperlipidemia Father    Lung cancer Father        smoker   Depression Sister    Cancer Paternal Grandmother     No Known Allergies  Current Outpatient Medications on File Prior to Visit  Medication Sig Dispense Refill   amLODipine (NORVASC) 10 MG tablet TAKE 1 TABLET BY MOUTH ONCE DAILY FOR BLOOD PRESSURE 90 tablet 2   blood glucose meter kit and supplies KIT Dispense based on patient and insurance preference. Use up to four times daily as directed. (FOR ICD-9 250.00,  250.01). 1 each 0   escitalopram (LEXAPRO) 10 MG tablet TAKE 1 TABLET BY MOUTH DAILY, FOR ANXIETY 90 tablet 0   glucose blood test strip Use as instructed to check blood glucose levels 3-4 times daily. 300 each 5   lamoTRIgine (LAMICTAL) 100 MG tablet Take 1 tablet (100 mg total) by mouth 2 (two) times daily. 180 tablet 2   medroxyPROGESTERone (DEPO-PROVERA) 150 MG/ML injection ADMINISTER 1 ML IN THE MUSCLE EVERY 11 TO 12 WEEKS     metFORMIN (GLUCOPHAGE-XR) 500 MG 24 hr tablet Take 2 tablets (1,000 mg total) by mouth daily with breakfast AND 1 tablet (500 mg total) every evening. 270 tablet 0   Multiple Vitamin (MULTIVITAMIN ADULT PO) Take by mouth.     OneTouch Delica Lancets 30G MISC Use up to 4 times daily for blood sugar  checks 400 each 1   cyanocobalamin 1000 MCG tablet Take 1,000 mcg by mouth daily. (Patient not taking: Reported on 04/10/2022)     ferrous sulfate 325 (65 FE) MG EC tablet Take 1 tablet (325 mg total) by mouth daily with breakfast. (Patient not taking: Reported on 04/10/2022) 30 tablet 3   potassium chloride SA (KLOR-CON M) 20 MEQ tablet Take 1 tablet (20 mEq total) by mouth 2 (two) times daily. For low potassium. (Patient not taking: Reported on 11/08/2021) 10 tablet 0   [DISCONTINUED] levETIRAcetam (KEPPRA) 500 MG tablet Take 1 tablet (500 mg total) by mouth daily. 14 tablet 0   No current facility-administered medications on file prior to visit.    BP 122/68   Pulse (!) 120   Temp 98.2 F (36.8 C) (Temporal)   Ht 5\' 6"  (1.676 m)   Wt 168 lb (76.2 kg)   SpO2 99%   BMI 27.12 kg/m  Objective:   Physical Exam HENT:     Right Ear: Tympanic membrane and ear canal normal.     Left Ear: Tympanic membrane and ear canal normal.     Nose: Nose normal.  Eyes:     Conjunctiva/sclera: Conjunctivae normal.     Pupils: Pupils are equal, round, and reactive to light.  Neck:     Thyroid: No thyromegaly.  Cardiovascular:     Rate and Rhythm: Normal rate and regular rhythm.     Heart sounds: No murmur heard. Pulmonary:     Effort: Pulmonary effort is normal.     Breath sounds: Normal breath sounds. No rales.  Abdominal:     General: Bowel sounds are normal.     Palpations: Abdomen is soft.     Tenderness: There is no abdominal tenderness.  Musculoskeletal:        General: Normal range of motion.     Cervical back: Neck supple.  Lymphadenopathy:     Cervical: No cervical adenopathy.  Skin:    General: Skin is warm and dry.     Findings: No rash.  Neurological:     Mental Status: She is alert and oriented to person, place, and time.     Cranial Nerves: No cranial nerve deficit.     Deep Tendon Reflexes: Reflexes are normal and symmetric.  Psychiatric:        Mood and Affect: Mood normal.            Assessment & Plan:  Preventative health care Assessment & Plan: Immunizations UTD Pap smear UTD.  Discussed the importance of a healthy diet and regular exercise in order for weight loss, and to reduce the risk of further co-morbidity.  Exam stable. Labs pending.  Follow up in 1 year for repeat physical.    Type 2 diabetes mellitus with hyperglycemia, without long-term current use of insulin (HCC) Assessment & Plan: Controlled and at goal with A1C of 5.6 today!  Continue metformin XR 1000 mg BID. Stop Glipizide XL 5 mg daily.  Continue to work on diet. Work on physical exercise.   Follow up in 6 months.   Orders: -     POCT glycosylated hemoglobin (Hb A1C) -     Basic metabolic panel  Essential hypertension Assessment & Plan: Controlled.  Amlodipine 10 mg daily. Continue to monitor.   Migraine without status migrainosus, not intractable, unspecified migraine type Assessment & Plan: Controlled. No concerns today.    Seizure disorder [facility]) Assessment & Plan: No recent seizures.  Continue Lamictal 100 mg BID. Reviewed office notes from October 2023.   Anxiety and depression Assessment & Plan: Controlled.  Continue Lexapro 10 mg daily.   Chronic anemia Assessment & Plan: Reviewed hematology notes and labs from October 2023.  Repeat CBC and iron studies pending. She has not taken ferrous sulfate in quite some time.  Orders: -     CBC with Differential/Platelet -     IBC + Ferritin  Chronic hypokalemia Assessment & Plan: Never connected with nephrology.  Repeat BMP pending.  Orders: -     Basic metabolic panel  Hypertriglyceridemia Assessment & Plan: Commended her on dietary changes!  Repeat lipid panel pending.  Orders: -     Lipid panel        Doreene Nest, NP

## 2022-09-24 NOTE — Assessment & Plan Note (Signed)
Never connected with nephrology.  Repeat BMP pending.

## 2022-09-24 NOTE — Assessment & Plan Note (Signed)
Immunizations UTD. Pap smear UTD  Discussed the importance of a healthy diet and regular exercise in order for weight loss, and to reduce the risk of further co-morbidity.  Exam stable. Labs pending.  Follow up in 1 year for repeat physical.  

## 2022-09-24 NOTE — Assessment & Plan Note (Signed)
Controlled and at goal with A1C of 5.6 today!  Continue metformin XR 1000 mg BID. Stop Glipizide XL 5 mg daily.  Continue to work on diet. Work on physical exercise.   Follow up in 6 months.

## 2022-09-24 NOTE — Assessment & Plan Note (Signed)
Commended her on dietary changes.  Repeat lipid panel pending. 

## 2022-09-24 NOTE — Assessment & Plan Note (Signed)
Controlled.  Continue Lexapro 10 mg daily. 

## 2022-09-24 NOTE — Assessment & Plan Note (Signed)
Controlled. No concerns today. 

## 2022-09-24 NOTE — Patient Instructions (Signed)
Stop by the lab prior to leaving today. I will notify you of your results once received.   Stop taking Glipizide for diabetes.  Please schedule a follow up visit for 6 months for a diabetes check.  It was a pleasure to see you today!

## 2022-09-24 NOTE — Assessment & Plan Note (Signed)
No recent seizures.  Continue Lamictal 100 mg BID. Reviewed office notes from October 2023.

## 2022-09-24 NOTE — Assessment & Plan Note (Addendum)
Reviewed hematology notes and labs from October 2023.  Repeat CBC and iron studies pending. She has not taken ferrous sulfate in quite some time.

## 2022-09-25 LAB — CBC WITH DIFFERENTIAL/PLATELET
Basophils Absolute: 0.1 10*3/uL (ref 0.0–0.1)
Basophils Relative: 1.1 % (ref 0.0–3.0)
Eosinophils Absolute: 0 10*3/uL (ref 0.0–0.7)
Eosinophils Relative: 0.3 % (ref 0.0–5.0)
HCT: 39.7 % (ref 36.0–46.0)
Hemoglobin: 13.2 g/dL (ref 12.0–15.0)
Lymphocytes Relative: 37 % (ref 12.0–46.0)
Lymphs Abs: 2.3 10*3/uL (ref 0.7–4.0)
MCHC: 33.2 g/dL (ref 30.0–36.0)
MCV: 105.8 fl — ABNORMAL HIGH (ref 78.0–100.0)
Monocytes Absolute: 0.5 10*3/uL (ref 0.1–1.0)
Monocytes Relative: 7.3 % (ref 3.0–12.0)
Neutro Abs: 3.4 10*3/uL (ref 1.4–7.7)
Neutrophils Relative %: 54.3 % (ref 43.0–77.0)
Platelets: 185 10*3/uL (ref 150.0–400.0)
RBC: 3.75 Mil/uL — ABNORMAL LOW (ref 3.87–5.11)
RDW: 13.6 % (ref 11.5–15.5)
WBC: 6.3 10*3/uL (ref 4.0–10.5)

## 2022-09-25 LAB — LDL CHOLESTEROL, DIRECT: Direct LDL: 128 mg/dL

## 2022-09-25 LAB — IBC + FERRITIN
Ferritin: 140.6 ng/mL (ref 10.0–291.0)
Iron: 131 ug/dL (ref 42–145)
Saturation Ratios: 31 % (ref 20.0–50.0)
TIBC: 422.8 ug/dL (ref 250.0–450.0)
Transferrin: 302 mg/dL (ref 212.0–360.0)

## 2022-09-25 LAB — BASIC METABOLIC PANEL
BUN: 8 mg/dL (ref 6–23)
CO2: 16 mEq/L — ABNORMAL LOW (ref 19–32)
Calcium: 9.5 mg/dL (ref 8.4–10.5)
Chloride: 100 mEq/L (ref 96–112)
Creatinine, Ser: 0.85 mg/dL (ref 0.40–1.20)
GFR: 87.51 mL/min (ref >60.00)
Glucose, Bld: 178 mg/dL — ABNORMAL HIGH (ref 70–99)
Potassium: 3.3 mEq/L — ABNORMAL LOW (ref 3.5–5.1)
Sodium: 135 mEq/L (ref 135–145)

## 2022-09-25 LAB — LIPID PANEL
Cholesterol: 252 mg/dL — ABNORMAL HIGH (ref 0–200)
HDL: 69.4 mg/dL (ref >39.00)
Total CHOL/HDL Ratio: 4
Triglycerides: 575 mg/dL — ABNORMAL HIGH (ref 0.0–149.0)

## 2022-10-01 ENCOUNTER — Ambulatory Visit (INDEPENDENT_AMBULATORY_CARE_PROVIDER_SITE_OTHER): Payer: 59 | Admitting: Otolaryngology

## 2022-10-01 ENCOUNTER — Encounter (INDEPENDENT_AMBULATORY_CARE_PROVIDER_SITE_OTHER): Payer: Self-pay | Admitting: Otolaryngology

## 2022-10-01 VITALS — BP 130/91 | HR 94 | Ht 66.0 in | Wt 168.0 lb

## 2022-10-01 DIAGNOSIS — R0981 Nasal congestion: Secondary | ICD-10-CM | POA: Diagnosis not present

## 2022-10-01 DIAGNOSIS — Z9109 Other allergy status, other than to drugs and biological substances: Secondary | ICD-10-CM | POA: Diagnosis not present

## 2022-10-01 DIAGNOSIS — J351 Hypertrophy of tonsils: Secondary | ICD-10-CM

## 2022-10-01 DIAGNOSIS — M26623 Arthralgia of bilateral temporomandibular joint: Secondary | ICD-10-CM

## 2022-10-01 DIAGNOSIS — K1321 Leukoplakia of oral mucosa, including tongue: Secondary | ICD-10-CM

## 2022-10-01 DIAGNOSIS — H6993 Unspecified Eustachian tube disorder, bilateral: Secondary | ICD-10-CM

## 2022-10-01 MED ORDER — METHYLPREDNISOLONE 4 MG PO TBPK: ORAL_TABLET | ORAL | 0 refills | Status: DC

## 2022-10-01 NOTE — Patient Instructions (Addendum)
-   Zyrtec  - Flonase BID - Motrin and warm compresses for TMJ - Medrol Pack  - Malachi Pro Book - how to quit smoking

## 2022-10-01 NOTE — Progress Notes (Signed)
ENT CONSULT:  Reason for Consult: ear pain, eustachian tube dysfunction suspected  Referring Physician:  Drinda Butts  HPI: Melissa Chapman is an 37 y.o. female with hx of anxiety/depression, chronic hypokalemia, HTN (on amlodipine), pancreatitis, sz d/o (last sx 08/2020, on Lamictal)), syncope, current every day smoker, 1 PPD, has been for 13 years, hx of T2DM, on Metformin, well-controlled, last A1C 5.6, here for initial evaluation of otalgia and recurrent ear infections.   Started in Feb 2024, she started to have URI sx and sinus pressure, nasal congestion. She was seen by PCP and had ear cleaning, and was told that she has bilateral ear infections, took Amox, then Augmentin. She returned to [facility] and was told she has persistent ear infection. She was started on Cefdinir and Mucinex, finished it and sx got better, but not all the way. She has constant facial and forehead pressure. She has constant pressure/discomfort n both ears, worse in left ear. She thinks she has allergies. Not on nasal sprays or allergy medications. Never been tested for allergies  Denies hx of bruxism  No GERD hx or sore throat. No prior ear surgery.  No prior head and neck surgeries.    Records Reviewed:  Seen for otalgia by Dr Lorenza Chick, Bernette Redbird, DO, [facility] 09/13/22    X 3-4 days Bilat ear pain and fullness States has had several ear infections Denies fevers and chills Denies feeling like she is going to pass out, head is lolling in triage Fatigued d/t not sleeping well Denies HAHx anemia, seizures, diabetes      Past Medical History:  Diagnosis Date   Anxiety and depression    Chronic hypokalemia    Essential hypertension    Pancreatitis    Seizures (HCC)    last seizure 09/02/20   Syncope    most recent March 2021   Viral gastroenteritis 06/20/2021    No past surgical history on file.  Family History  Problem Relation Age of Onset   Depression Mother    Hypertension Mother     Hyperlipidemia Mother    Hypertension Father    Diabetes Father    Hyperlipidemia Father    Lung cancer Father        smoker   Depression Sister    Cancer Paternal Grandmother     Social History:  reports that she has been smoking cigarettes. She has a 13.00 pack-year smoking history. She has never used smokeless tobacco. She reports current alcohol use. She reports that she does not use drugs.  Allergies: No Known Allergies  Medications: I have reviewed the patient's current medications.  No results found for this or any previous visit (from the past 48 hour(s)).  No results found.  The PMH, PSH, Medications, Allergies, and SH were reviewed and updated.  ROS: Constitutional: Negative for fever, weight loss and weight gain. Cardiovascular: Negative for chest pain and dyspnea on exertion. Respiratory: Is not experiencing shortness of breath at rest. Gastrointestinal: Negative for nausea and vomiting. Neurological: Negative for headaches. Psychiatric: The patient is not nervous/anxiou  There were no vitals taken for this visit.  PHYSICAL EXAM:  Exam: General: Well-developed, well-nourished Respiratory Respiratory effort: Equal inspiration and expiration without stridor Cardiovascular Peripheral Vascular: Warm extremities with equal color/perfusion Neuro/Psych/Balance: Patient oriented to person, place, and time; Appropriate mood and affect; Gait is intact with no imbalance; Cranial nerves I-XII are intact Head and Face Inspection: Normocephalic and atraumatic without mass or lesion Palpation: Facial skeleton intact without bony stepoffs Salivary  Glands: No mass or tenderness Facial Strength: Facial motility symmetric and full bilaterally ENT Pinna: External ear intact and fully developed External canal: Canal is patent with intact skin Tympanic Membrane: Clear but cannot rule out middle ear effusion External Nose: No scar or anatomic deformity  Lips, Teeth, and gums:  Mucosa and teeth intact and viable TMJ: discomfort to palpation b/l with full mobility Oral cavity/oropharynx: No erythema or exudate, no lesions present B/l tonsillar hypertrophy slightly enlarged on the left and more enlarged on the right  Right buccal mucosal leukoplakia and left lateral tongue with irregular mucosa  Neck Neck and Trachea: Midline trachea without mass or lesion Thyroid: No mass or nodularity Lymphatics: No lymphadenopathy    Procedure: none    Studies Reviewed:none  Assessment/Plan: 37 yoF hx environmental allergies and chronic nasal congestion, chronic nasal congestion, hx of smoking, here for recurrent otalgia, without signs of ear infection on exam. Suspect TMJ vs chronic ETD in the setting of uncontrolled nasal congestion and allergies.   Encounter Diagnoses  Name Primary?   Allergy to environmental factors Yes   Nasal congestion    Tonsillar hypertrophy    Oral leukoplakia    Dysfunction of both eustachian tubes    Bilateral temporomandibular joint pain    - Zyrtec  - Flonase BID - Motrin and warm compresses for TMJ - Medrol Pack  - Malachi Pro Book - how to quit smoking - we discussed smoking cessation - she will try to educate herself on strategies to help her quit  - will plan for nasal endoscopy and flexible laryngoscopy - will consider allergy testing and CT sinuses  - RTC 2 months   Thank you for allowing me to participate in the care of this patient. Please do not hesitate to contact me with any questions or concerns.   Ashok Croon, MD Otolaryngology [facility] Phone: [phone] Fax: [phone]    10/01/2022, 8:28 AM

## 2022-10-22 ENCOUNTER — Other Ambulatory Visit: Payer: Self-pay | Admitting: Primary Care

## 2022-10-22 DIAGNOSIS — E1165 Type 2 diabetes mellitus with hyperglycemia: Secondary | ICD-10-CM

## 2022-10-22 MED ORDER — METFORMIN HCL ER 500 MG PO TB24
ORAL_TABLET | ORAL | 1 refills | Status: DC
Start: 2022-10-22 — End: 2023-10-16

## 2022-11-09 NOTE — Telephone Encounter (Signed)
Please route to ENT, thank you

## 2022-11-30 ENCOUNTER — Ambulatory Visit (INDEPENDENT_AMBULATORY_CARE_PROVIDER_SITE_OTHER): Payer: Commercial Managed Care - HMO | Admitting: Otolaryngology

## 2022-12-02 ENCOUNTER — Other Ambulatory Visit: Payer: Self-pay | Admitting: Primary Care

## 2022-12-02 DIAGNOSIS — F32A Depression, unspecified: Secondary | ICD-10-CM

## 2022-12-11 ENCOUNTER — Ambulatory Visit (INDEPENDENT_AMBULATORY_CARE_PROVIDER_SITE_OTHER): Payer: Commercial Managed Care - HMO | Admitting: Otolaryngology

## 2022-12-19 ENCOUNTER — Telehealth (INDEPENDENT_AMBULATORY_CARE_PROVIDER_SITE_OTHER): Payer: Self-pay | Admitting: Otolaryngology

## 2022-12-19 NOTE — Telephone Encounter (Signed)
error 

## 2023-01-14 ENCOUNTER — Encounter: Payer: Self-pay | Admitting: Adult Health

## 2023-01-14 ENCOUNTER — Telehealth (INDEPENDENT_AMBULATORY_CARE_PROVIDER_SITE_OTHER): Payer: 59 | Admitting: Adult Health

## 2023-01-14 DIAGNOSIS — G40909 Epilepsy, unspecified, not intractable, without status epilepticus: Secondary | ICD-10-CM | POA: Diagnosis not present

## 2023-01-14 MED ORDER — LAMOTRIGINE 100 MG PO TABS: 100.0000 mg | ORAL_TABLET | Freq: Two times a day (BID) | ORAL | 3 refills | Status: DC

## 2023-01-14 NOTE — Progress Notes (Signed)
[facility]  PATIENT: Melissa Chapman DOB: Jun 05, 1985  REFERRING CLINICIAN: Doreene Nest, NP HISTORY FROM: patient REASON FOR VISIT: follow up    Virtual Visit via Video Note  I connected with Melissa Chapman on 01/14/23 at  1:45 PM EDT by a video enabled telemedicine application and verified that I am speaking with the correct person using two identifiers.  Location: Patient: at home Provider: in office   I discussed the limitations of evaluation and management by telemedicine and the availability of in person appointments. The patient expressed understanding and agreed to proceed.    HISTORICAL   HISTORY OF PRESENT ILLNESS:   Update 01/14/2023 JM: Patient returns via virtual visit for yearly seizure follow-up.  Reports doing well.  Denies any seizures.  Reports compliance with lamotrigine 100mg  BID, denies side effects.  No questions or concerns at this time.    UPDATE (01/16/22, VRP): Since last visit, doing well. Symptoms are stable. No seizures. Tolerating lamotrigine.Marland Kitchen    UPDATE (09/05/20, VRP): Since last visit, was doing well until last couple of weeks with severely increased stress and anxiety.  Patient was feeling more jittery and shaky and called our office on 09/01/2020.  The next day she was having memory lapse, confusion, and other symptoms that prompted her mother to go check on her.  Patient's mother witnessed a abnormal spell of loss of consciousness which lasted for few seconds.  They called 911 for evaluation and paramedics arrived on scene.  Patient returned to baseline fairly quickly and declined to go to the emergency room.  Since then patient continues to feel jittery and shaky.  UPDATE (04/05/20, VRP): Since last visit, had been intermittently taking lamotrigine (due to agitation side effects). Then had breakthrough seizure in 02/28/20 (at home, in bathroom, tongue biting, shaking, spitting up, post-ictal confusion). Went to ER, and  then started on LEV + lamotrigine. Now tolerating both meds.   PRIOR HPI: 37 year old female here for evaluation of seizure vs syncope.  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   04/12/2018 patient was at home, woke up in the emergency room.  Apparently she had passed out.  She was diagnosed with dehydration and syncope.  06/28/2019 patient had just arrived in [place] with her son, was at the hotel when she collapsed and had seizure-like activity.  Apparently she woke up and was able to talk to the staff but then had a second event.  Patient does not remember waking up in between the 2 events.  She was taken to local hospital and had CT and EEG.  She does not have these results.  She does not know what the conclusion of her work-up was.  Patient was able to come back to the [place] safely.  Patient denies any excessive alcohol use recently.  She states she drinks about 3 drinks per week.  She did not have any alcohol on 06/28/2019.  Patient does endorse chronic sleep deprivation and irregular sleep pattern.  She has history of anxiety.  She has been on bupropion for 2 years.  This was increased in dosage in June 14, 2020after her father passed away.  She was previously on Zoloft.  Patient has had issues with chronic nausea, hypokalemia, abdominal pain in the past.  She also had pancreatitis x2 in 2017, 2018, possibly related to excessive alcohol use at that time.  No family history of seizure.  Patient lives at home with her 87-year-old son.  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  She is not currently working.    REVIEW OF SYSTEMS: Full 14 system review of systems performed and negative with exception of: as per HPI.  ALLERGIES: No Known Allergies  HOME MEDICATIONS: Outpatient Medications Prior to Visit  Medication Sig Dispense Refill   amLODipine (NORVASC) 10 MG tablet TAKE 1 TABLET BY MOUTH  ONCE DAILY FOR BLOOD PRESSURE 90 tablet 2   blood glucose meter kit and supplies KIT Dispense based on patient and insurance preference. Use up to four times daily as directed. (FOR ICD-9 250.00, 250.01). 1 each 0   cyanocobalamin 1000 MCG tablet Take 1,000 mcg by mouth daily.     escitalopram (LEXAPRO) 10 MG tablet TAKE 1 TABLET BY MOUTH DAILY FOR ANXIETY 90 tablet 2   ferrous sulfate 325 (65 FE) MG EC tablet Take 1 tablet (325 mg total) by mouth daily with breakfast. 30 tablet 3   glucose blood test strip Use as instructed to check blood glucose levels 3-4 times daily. 300 each 5   medroxyPROGESTERone (DEPO-PROVERA) 150 MG/ML injection ADMINISTER 1 ML IN THE MUSCLE EVERY 11 TO 12 WEEKS     metFORMIN (GLUCOPHAGE-XR) 500 MG 24 hr tablet Take 2 tablets (1,000 mg total) by mouth daily with breakfast AND 1 tablet (500 mg total) every evening. for diabetes.. 270 tablet 1   methylPREDNISolone (MEDROL DOSEPAK) 4 MG TBPK tablet Take as directed 1 each 0   Multiple Vitamin (MULTIVITAMIN ADULT PO) Take by mouth.     OneTouch Delica Lancets 30G MISC Use up to 4 times daily for blood sugar checks 400 each 1   potassium chloride SA (KLOR-CON M) 20 MEQ tablet Take 1 tablet (20 mEq total) by mouth 2 (two) times daily. For low potassium. 10 tablet 0   lamoTRIgine (LAMICTAL) 100 MG tablet Take 1 tablet (100 mg total) by mouth 2 (two) times daily. 180 tablet 2   No facility-administered medications prior to visit.    PAST MEDICAL HISTORY: Past Medical History:  Diagnosis Date   Anxiety and depression    Chronic hypokalemia    Essential hypertension    Pancreatitis    Seizures (HCC)    last seizure 09/02/20   Syncope    most recent March 2021   Viral gastroenteritis 06/20/2021    PAST SURGICAL HISTORY: No past surgical history on file.  FAMILY HISTORY: Family History  Problem Relation Age of Onset   Depression Mother    Hypertension Mother    Hyperlipidemia Mother    Hypertension Father     Diabetes Father    Hyperlipidemia Father    Lung cancer Father        smoker   Depression Sister    Cancer Paternal Grandmother     SOCIAL HISTORY: Social History   Socioeconomic History   Marital status: Single    Spouse name: Not on file   Number of children: 1   Years of education: Not on file   Highest education level: Bachelor's degree (e.g., BA, AB, BS)  Occupational History    Comment: RN [employer]  Tobacco Use   Smoking status: Every Day    Current packs/day: 1.00    Average packs/day: 1 pack/day for 13.0 years (13.0 ttl pk-yrs)    Types: Cigarettes   Smokeless tobacco: Never  Substance and Sexual Activity   Alcohol use: Yes    Comment: socially   Drug use: No  Sexual activity: Yes  Other Topics Concern   Not on file  Social History Narrative   Single.    1 son.   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   Social Determinants of Health   Financial Resource Strain: Not on file  Food Insecurity: Not on file  Transportation Needs: Not on file  Physical Activity: Not on file  Stress: Not on file  Social Connections: Not on file  Intimate Partner Violence: Not on file     PHYSICAL EXAM N/a d/t visit type   DIAGNOSTIC DATA (LABS, IMAGING, TESTING) - I reviewed patient records, labs, notes, testing and imaging myself where available.  Lab Results  Component Value Date   WBC 6.3 09/24/2022   HGB 13.2 09/24/2022   HCT 39.7 09/24/2022   MCV 105.8 (H) 09/24/2022   PLT 185.0 09/24/2022      Component Value Date/Time   NA 135 09/24/2022 1427   NA 133 (A) 02/26/2022 0000   NA 138 12/13/2013 0445   K 3.3 (L) 09/24/2022 1427   K 3.2 (L) 12/13/2013 0445   CL 100 09/24/2022 1427   CL 105 12/13/2013 0445   CO2 16 (L) 09/24/2022 1427   CO2 26 12/13/2013 0445   GLUCOSE 178 (H) 09/24/2022 1427   GLUCOSE 75 12/13/2013 0445   BUN 8 09/24/2022 1427   BUN 14 02/26/2022 0000   BUN 4 (L) 12/13/2013 0445   CREATININE 0.85  09/24/2022 1427   CREATININE 0.83 01/12/2022 1519   CREATININE 0.84 12/13/2013 0445   CALCIUM 9.5 09/24/2022 1427   CALCIUM 7.3 (L) 12/13/2013 0445   PROT 8.1 01/12/2022 1519   PROT 7.9 09/05/2020 1648   PROT 7.6 12/11/2013 2310   ALBUMIN 4.6 02/26/2022 0000   ALBUMIN 4.9 (H) 09/05/2020 1648   ALBUMIN 3.8 12/11/2013 2310   AST 10 (L) 01/12/2022 1519   ALT 6 01/12/2022 1519   ALT 23 12/11/2013 2310   ALKPHOS 135 (H) 01/12/2022 1519   ALKPHOS 65 12/11/2013 2310   BILITOT 0.3 01/12/2022 1519   GFRNONAA >60 01/12/2022 1519   GFRNONAA >60 12/13/2013 0445   GFRAA >60 06/18/2018 1858   GFRAA >60 12/13/2013 0445   Lab Results  Component Value Date   CHOL 252 (H) 09/24/2022   HDL 69.40 09/24/2022   LDLCALC 58 08/08/2021   LDLDIRECT 128.0 09/24/2022   TRIG (H) 09/24/2022    575.0 Triglyceride is over 400; calculations on Lipids are invalid.   CHOLHDL 4 09/24/2022   Lab Results  Component Value Date   HGBA1C 5.6 09/24/2022   Lab Results  Component Value Date   VITAMINB12 278 01/12/2022   Lab Results  Component Value Date   TSH 1.41 08/08/2021    09/07/19 EEG  - Normal EEG in the awake and drowsy states.  09/14/20 EEG - Normal     ASSESSMENT AND PLAN  37 y.o. year old female here with:  Dx:  1. Seizure disorder [facility])      PLAN:  SEIZURE DISORDER --> last events 04/12/18, 06/28/19 x 2, 02/28/20, 09/05/20 - continue lamotrigine 100mg  twice a day for seizure prevention (can also help with mood stabilization) -Advised to call with any breakthrough seizure activity -Avoid seizure provoking triggers and activities  Meds ordered this encounter  Medications   lamoTRIgine (LAMICTAL) 100 MG tablet    Sig: Take 1 tablet (100 mg total) by mouth 2 (two) times daily.    Dispense:  180 tablet  Refill:  3   Return in about 1 year (around 01/14/2024).    I spent 15 minutes of face-to-face and non-face-to-face time with patient.  This included previsit chart review, lab  review, study review, order entry, electronic health record documentation, patient education and discussion regarding above diagnoses and treatment plan and answered all other questions to patient's satisfaction  Ihor Austin, [facility]  [facility] [address] [place], [place] [address]  Phone [phone] Fax [phone] Note: This document was prepared with digital dictation and possible smart phrase technology. Any transcriptional errors that result from this process are unintentional.

## 2023-01-23 ENCOUNTER — Encounter: Payer: Self-pay | Admitting: Primary Care

## 2023-01-23 ENCOUNTER — Ambulatory Visit: Payer: Managed Care, Other (non HMO) | Admitting: Primary Care

## 2023-01-23 VITALS — BP 138/88 | HR 68 | Temp 97.9°F | Ht 66.0 in | Wt 167.8 lb

## 2023-01-23 DIAGNOSIS — E538 Deficiency of other specified B group vitamins: Secondary | ICD-10-CM | POA: Diagnosis not present

## 2023-01-23 DIAGNOSIS — E781 Pure hyperglyceridemia: Secondary | ICD-10-CM | POA: Diagnosis not present

## 2023-01-23 DIAGNOSIS — E1165 Type 2 diabetes mellitus with hyperglycemia: Secondary | ICD-10-CM | POA: Diagnosis not present

## 2023-01-23 DIAGNOSIS — Z23 Encounter for immunization: Secondary | ICD-10-CM

## 2023-01-23 LAB — POCT GLYCOSYLATED HEMOGLOBIN (HGB A1C): Hemoglobin A1C: 11.9 % — AB (ref 4.0–5.6)

## 2023-01-23 MED ORDER — BASAGLAR KWIKPEN 100 UNIT/ML ~~LOC~~ SOPN
10.0000 [IU] | PEN_INJECTOR | Freq: Every day | SUBCUTANEOUS | 0 refills | Status: DC
Start: 2023-01-23 — End: 2023-02-07

## 2023-01-23 MED ORDER — PEN NEEDLES 31G X 6 MM MISC: 1 refills | Status: DC

## 2023-01-23 NOTE — Progress Notes (Signed)
Subjective:    Patient ID: Melissa Chapman, female    DOB: 22-May-1985, 37 y.o.   MRN: [id number]  Diabetes Pertinent negatives for hypoglycemia include no dizziness. Pertinent negatives for diabetes include no chest pain, no polydipsia, no polyphagia and no polyuria.    Melissa Chapman is a very pleasant 37 y.o. female with a history of type 2 diabetes, hypertension, migraines, seizure disorder, anxiety and depression who presents today for follow up of diabetes.  She is also due for repeat lipid panel.  Current medications include: Metformin XR 1000 mg in a.m. and 5 mg in p.m. Her glipizide XL was discontinued during her last visit as her A1C had significantly improved to 5.6 today.   She began checking her blood glucose levels starting 2 weeks ago and is checking 2 times daily and is getting readings of:  AM fasting: high 100s to 220s Evening: high 100s to 220s  Highest reading: 311 today   Last A1C: 5.6 in June 2024, 11.9 today. Last Eye Exam: UTD Last Foot Exam: Due Pneumonia Vaccination: Never completed Urine Microalbumin: UTD Statin: None.   Dietary changes since last visit: None. Mostly eating a healthy diet. Limiting sugar. Hardly eats out.   Exercise: None  She denies numbness/tingling, polyuria, polydipsia, polyphagia, dizziness, blurred vision.   Review of Systems  Eyes:  Negative for visual disturbance.  Respiratory:  Negative for shortness of breath.   Cardiovascular:  Negative for chest pain.  Endocrine: Negative for polydipsia, polyphagia and polyuria.  Neurological:  Negative for dizziness and numbness.         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    1 son.   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   Social Determinants of Health   Financial Resource Strain: Not on file  Food Insecurity: Not on file  Transportation Needs: Not on file  Physical Activity: Not on file  Stress: Not on file  Social Connections: Not on file  Intimate Partner Violence: Not on file    No past surgical history on file.  Family History  Problem Relation Age of Onset   Depression Mother    Hypertension Mother    Hyperlipidemia Mother    Hypertension Father    Diabetes Father    Hyperlipidemia Father    Lung cancer Father        smoker   Depression Sister    Cancer  Paternal Grandmother     No Known Allergies  Current Outpatient Medications on File Prior to Visit  Medication Sig Dispense Refill   amLODipine (NORVASC) 10 MG tablet TAKE 1 TABLET BY MOUTH ONCE DAILY FOR BLOOD PRESSURE 90 tablet 2   blood glucose meter kit and supplies KIT Dispense based on patient and insurance preference. Use up to four times daily as directed. (FOR ICD-9 250.00, 250.01). 1 each 0   escitalopram (LEXAPRO) 10 MG tablet TAKE 1 TABLET BY MOUTH DAILY FOR ANXIETY 90 tablet 2   glucose blood test strip Use as instructed to check blood glucose levels 3-4 times daily. 300 each 5   lamoTRIgine (LAMICTAL) 100 MG tablet Take 1 tablet (100 mg total) by mouth 2 (two) times daily. 180 tablet 3    medroxyPROGESTERone (DEPO-PROVERA) 150 MG/ML injection ADMINISTER 1 ML IN THE MUSCLE EVERY 11 TO 12 WEEKS     metFORMIN (GLUCOPHAGE-XR) 500 MG 24 hr tablet Take 2 tablets (1,000 mg total) by mouth daily with breakfast AND 1 tablet (500 mg total) every evening. for diabetes.. 270 tablet 1   Multiple Vitamin (MULTIVITAMIN ADULT PO) Take by mouth.     OneTouch Delica Lancets 30G MISC Use up to 4 times daily for blood sugar checks 400 each 1   [DISCONTINUED] levETIRAcetam (KEPPRA) 500 MG tablet Take 1 tablet (500 mg total) by mouth daily. 14 tablet 0   No current facility-administered medications on file prior to visit.    BP 138/88 (BP Location: Left Arm, Patient Position: Sitting, Cuff Size: Normal)   Pulse 68   Temp 97.9 F (36.6 C) (Oral)   Ht 5\' 6"  (1.676 m)   Wt 167 lb 12.8 oz (76.1 kg)   SpO2 98%   BMI 27.08 kg/m  Objective:   Physical Exam Cardiovascular:     Rate and Rhythm: Normal rate and regular rhythm.  Pulmonary:     Effort: Pulmonary effort is normal.     Breath sounds: Normal breath sounds.  Musculoskeletal:     Cervical back: Neck supple.  Skin:    General: Skin is warm and dry.  Neurological:     Mental Status: She is alert and oriented to person, place, and time.  Psychiatric:        Mood and Affect: Mood normal.           Assessment & Plan:  Type 2 diabetes mellitus with hyperglycemia, without long-term current use of insulin (HCC) Assessment & Plan: Uncontrolled with A1c 11.9 today. Asymptomatic but requires further investigation, especially given significant change from 5.6 three months prior without other lifestyle changes.  Continue Metformin XR 1000 mg AM, 500 mg PM.   Start insulin glargine (Basaglar Kwikpen) 10 units [place] daily at bedtime.   Discussed importance of starting regular blood glucose monitoring.  Discussed continuing healthy diet and lifestyle changes.   Lipid panel, lipase, TSH, vitamin B12 pending.   She will keep a record of  her blood glucose levels and send her readings via MyChart. Plan for type 1 diabetes testing when blood sugars consistently <180.  Await results. Close follow up in 2 weeks for additional testing.   I evaluated patient, was consulted regarding treatment, and agree with assessment and plan per Tenna Delaine, RN, DNP student.   Mayra Reel, NP-C   Orders: -     POCT glycosylated hemoglobin (Hb A1C) -     TSH -     Lipase -     Basaglar KwikPen; Inject 10  Units into the skin daily. For diabetes.  Dispense: 15 mL; Refill: 0 -     Pen Needles; Use nightly with insulin.  Dispense: 100 each; Refill: 1  Encounter for immunization -     Flu vaccine trivalent PF, 6mos and older(Flulaval,Afluria,Fluarix,Fluzone)  Vitamin B 12 deficiency -     Vitamin B12  Hypertriglyceridemia -     Lipid panel        Doreene Nest, NP

## 2023-01-23 NOTE — Assessment & Plan Note (Addendum)
Uncontrolled with A1c 11.9 today. Asymptomatic but requires further investigation, especially given significant change from 5.6 three months prior without other lifestyle changes.  Continue Metformin XR 1000 mg AM, 500 mg PM.   Start insulin glargine (Basaglar Kwikpen) 10 units [place] daily at bedtime.   Discussed importance of starting regular blood glucose monitoring.  Discussed continuing healthy diet and lifestyle changes.   Lipid panel, lipase, TSH, vitamin B12 pending.   She will keep a record of her blood glucose levels and send her readings via MyChart. Plan for type 1 diabetes testing when blood sugars consistently <180.  Await results. Close follow up in 2 weeks for additional testing.   I evaluated patient, was consulted regarding treatment, and agree with assessment and plan per Tenna Delaine, RN, DNP student.   Mayra Reel, NP-C

## 2023-01-23 NOTE — Progress Notes (Signed)
Established Patient Office Visit  Subjective   Patient ID: Melissa Chapman, female    DOB: 07/19/1985  Age: 37 y.o. MRN: [id number]  Chief Complaint  Patient presents with   Diabetes    Diabetes Pertinent negatives for hypoglycemia include no dizziness, headaches or nervousness/anxiousness. Pertinent negatives for diabetes include no blurred vision, no chest pain and no weakness.    Melissa Chapman is a very pleasant 37 y.o. female with a history of hypertension, migraines, seizure disorder, type 2 diabetes, anxiety and depression, chronic anemia, hypokalemia who presents today for diabetes follow up.   For the last week and a half, she has noticed her blood glucose increasing on her checks. Her blood glucose readings are in the high 100's-220's in the morning and at night. Today when she checked it 2 hours after she ate, her blood glucose was 311. She denies signs and symptoms of hyperglycemia or hypoglycemia. At her last visit, glipizide XL 5 mg was discontinued as her A1c was well controlled at 5.6. She continued Metformin XR 1000 mg AM and 500 mg PM which she has been compliant to.  Of note, 1 year ago her A1c was 5.4, then in December 2023 A1c increased to 12.9. Discussed getting blood glucose levels under 180 to test for Type 1 diabetes. This has not yet been completed. A1c did improve to 6.7 after treatment and most recently was 5.6. Today A1c 11.9.   Current medications include: Metformin XR 1000 mg in AM, 500 mg in PM.   She is checking her blood glucose 2 times daily and is getting readings of high 100's-220's.   Highest reading: 311.   Last A1C: 11.9 today. Last A1c 5.6 on 09/24/2022.  Last Eye Exam: UTD Last Foot Exam: declines Urine Microalbumin: UTD Statin: Not on statin therapy Flu shot: Completed today  Dietary changes since last visit: Her diet has been consistent. She is eating very little sugar, not eating fast food, and has reduced carbohydrates.   Exercise: No  regular exercise.   Review of Systems  Constitutional:  Negative for chills and fever.  Eyes:  Negative for blurred vision and double vision.  Respiratory:  Negative for cough and shortness of breath.   Cardiovascular:  Negative for chest pain.  Gastrointestinal:  Negative for diarrhea, nausea and vomiting.  Genitourinary:  Negative for dysuria, frequency and urgency.       Denies polyuria, polydipsia, polyphagia  Neurological:  Negative for dizziness, tingling, sensory change, weakness and headaches.  Psychiatric/Behavioral:  Negative for depression. The patient is not nervous/anxious.      Objective:     BP 138/88 (BP Location: Left Arm, Patient Position: Sitting, Cuff Size: Normal)   Pulse 68   Temp 97.9 F (36.6 C) (Oral)   Ht 5\' 6"  (1.676 m)   Wt 167 lb 12.8 oz (76.1 kg)   SpO2 98%   BMI 27.08 kg/m   BP Readings from Last 3 Encounters:  01/23/23 138/88  10/01/22 (!) 130/91  09/24/22 122/68   Wt Readings from Last 3 Encounters:  01/23/23 167 lb 12.8 oz (76.1 kg)  10/01/22 168 lb (76.2 kg)  09/24/22 168 lb (76.2 kg)    Physical Exam Eyes:     Pupils: Pupils are equal, round, and reactive to light.  Cardiovascular:     Rate and Rhythm: Normal rate and regular rhythm.     Heart sounds: Normal heart sounds.  Pulmonary:     Effort: Pulmonary effort is normal.  Breath sounds: Normal breath sounds.  Skin:    General: Skin is warm and dry.  Neurological:     General: No focal deficit present.     Mental Status: She is alert.     Results for orders placed or performed in visit on 01/23/23  POCT glycosylated hemoglobin (Hb A1C)  Result Value Ref Range   Hemoglobin A1C 11.9 (A) 4.0 - 5.6 %   HbA1c POC (<> result, manual entry)     HbA1c, POC (prediabetic range)     HbA1c, POC (controlled diabetic range)        The ASCVD Risk score (Arnett DK, et al., 2019) failed to calculate for the following reasons:   The 2019 ASCVD risk score is only valid for ages  95 to 53    Assessment & Plan:   Problem List Items Addressed This Visit       Endocrine   Type 2 diabetes mellitus with hyperglycemia (HCC) - Primary    Uncontrolled with A1c 11.9 today. Asymptomatic but requires further investigation, especially given significant change from 5.6 three months prior without other lifestyle changes.  Continue Metformin XR 1000 mg AM, 500 mg PM.   Start insulin glargine (Basaglar Kwikpen) 10 units [place] daily at bedtime.   Discussed importance of starting regular blood glucose monitoring.  Discussed continuing healthy diet and lifestyle changes.   Lipid panel, lipase, TSH, vitamin B12 pending.   She will keep a record of her blood glucose levels and send her readings via MyChart. Plan for type 1 diabetes testing when blood sugars consistently <180.  Await results. Close follow up in 2 weeks for additional testing.   I evaluated patient, was consulted regarding treatment, and agree with assessment and plan per Tenna Delaine, RN, DNP student.   Mayra Reel, NP-C       Relevant Medications   Insulin Glargine (BASAGLAR KWIKPEN) 100 UNIT/ML   Insulin Pen Needle (PEN NEEDLES) 31G X 6 MM MISC   Other Relevant Orders   POCT glycosylated hemoglobin (Hb A1C) (Completed)   TSH   Lipase     Other   Hypertriglyceridemia   Relevant Orders   Lipid panel   Vitamin B 12 deficiency   Relevant Orders   Vitamin B12   Other Visit Diagnoses     Encounter for immunization       Relevant Orders   Flu vaccine trivalent PF, 6mos and older(Flulaval,Afluria,Fluarix,Fluzone) (Completed)       No follow-ups on file.    Benito Mccreedy, RN

## 2023-01-23 NOTE — Patient Instructions (Addendum)
Stop by the lab prior to leaving today. I will notify you of your results once received.   Continue Metformin 2 tablets in the morning and 1 tablet in the evening.  Start insulin glargine (Basaglar [place]). Inject 10 units into the skin daily.   Check your blood sugar before meals, 2 hours after a meal, and at bedtime.   Continue your healthy diet.   We need your blood sugars to be consistently under 180 to order the tests to check for Type 1 diabetes.  Keep a record of your blood sugars.   Message me via MyChart on Monday 10/28 with how it is going and send me your blood sugar readings.

## 2023-01-24 LAB — TSH: TSH: 1.04 u[IU]/mL (ref 0.35–5.50)

## 2023-01-24 LAB — LIPID PANEL
Cholesterol: 218 mg/dL — ABNORMAL HIGH (ref 0–200)
HDL: 36.2 mg/dL — ABNORMAL LOW (ref >39.00)
LDL Cholesterol: 108 mg/dL — ABNORMAL HIGH (ref 0–99)
NonHDL: 181.5
Total CHOL/HDL Ratio: 6
Triglycerides: 367 mg/dL — ABNORMAL HIGH (ref 0.0–149.0)
VLDL: 73.4 mg/dL — ABNORMAL HIGH (ref 0.0–40.0)

## 2023-01-24 LAB — VITAMIN B12: Vitamin B-12: 493 pg/mL (ref 211–911)

## 2023-01-24 LAB — LIPASE: Lipase: 32 U/L (ref 11.0–59.0)

## 2023-02-04 DIAGNOSIS — E1165 Type 2 diabetes mellitus with hyperglycemia: Secondary | ICD-10-CM

## 2023-02-07 MED ORDER — BASAGLAR KWIKPEN 100 UNIT/ML ~~LOC~~ SOPN: 25.0000 [IU] | PEN_INJECTOR | Freq: Every day | SUBCUTANEOUS | 0 refills | Status: DC

## 2023-02-07 MED ORDER — GLIPIZIDE ER 5 MG PO TB24: 5.0000 mg | ORAL_TABLET | Freq: Every day | ORAL | 0 refills | Status: DC

## 2023-02-13 ENCOUNTER — Encounter: Payer: Self-pay | Admitting: Family Medicine

## 2023-02-13 ENCOUNTER — Ambulatory Visit: Payer: Managed Care, Other (non HMO) | Admitting: Family Medicine

## 2023-02-13 VITALS — BP 128/78 | HR 110 | Temp 98.3°F | Ht 66.0 in | Wt 165.4 lb

## 2023-02-13 DIAGNOSIS — E1165 Type 2 diabetes mellitus with hyperglycemia: Secondary | ICD-10-CM | POA: Diagnosis not present

## 2023-02-13 DIAGNOSIS — R519 Headache, unspecified: Secondary | ICD-10-CM | POA: Diagnosis not present

## 2023-02-13 LAB — GLUCOSE, POCT (MANUAL RESULT ENTRY): POC Glucose: 100 mg/dL — AB (ref 70–99)

## 2023-02-13 MED ORDER — METHYLPREDNISOLONE ACETATE 40 MG/ML IJ SUSP
40.0000 mg | Freq: Once | INTRAMUSCULAR | Status: AC
  Administered 2023-02-13: 40 mg via INTRAMUSCULAR

## 2023-02-13 NOTE — Assessment & Plan Note (Addendum)
Suspect migraine given her description.  She reports no history of this though does have documented migraine in her chart.  Suspect exacerbation of a chronic issue.  She is neurologically intact.  We will treat with Depo-Medrol 40 mg daily.  She will take Benadryl 25 mg for 1 dose when she gets home.  Extensive number Melissa Chapman had with the patient regarding monitoring her sugars after the steroid injection.  Glucose in the office was 100.  She will monitor her sugars after getting the steroid injection.  If glucose is consistently elevated above 400 she will let us know right away.  Glucose checked today.  Discussed the need to follow-up with her PCP to consider possible preventative treatment for migraines.  Advised to seek medical attention if she develops worsening headache or if she develops any neurological symptoms.  Discussed seeking medical attention if her headache does not improve with this treatment.

## 2023-02-13 NOTE — Patient Instructions (Signed)
Nice to see you. If your headache does not improve with the injection given today please get reevaluated.  If you develop severe headache or if you start having numbness, weakness, vision changes, or any neurological symptoms please go to the emergency room. Please monitor your glucose given that we gave you a steroid shot.  If your glucose goes above 400 please let us know right away.

## 2023-02-13 NOTE — Progress Notes (Signed)
[name], MD Phone: [phone]  Melissa Chapman is a 37 y.o. female who presents today for same-day visit.  Headache: Onset early this morning around 6 AM.  Gradually built up to being where it is now.  Has headache across the front of her head.  Some tension in her neck.  If she extends her neck that seems to help with the tension.  No numbness, weakness, vision changes.  No history of migraine.  She reports having a similar headache 3 weeks ago.  She took Tylenol initially and then took 200 mg of ibuprofen.  Notes those things were not beneficial.  She has had photophobia and phonophobia.  Reports glucose was in the 180s earlier today.  Patient denies history of migraine.  Social History   Tobacco Use  Smoking Status Every Day   Current packs/day: 1.00   Average packs/day: 1 pack/day for 13.0 years (13.0 ttl pk-yrs)   Types: Cigarettes  Smokeless Tobacco Never    Current Outpatient Medications on File Prior to Visit  Medication Sig Dispense Refill   amLODipine (NORVASC) 10 MG tablet TAKE 1 TABLET BY MOUTH ONCE DAILY FOR BLOOD PRESSURE 90 tablet 2   blood glucose meter kit and supplies KIT Dispense based on patient and insurance preference. Use up to four times daily as directed. (FOR ICD-9 250.00, 250.01). 1 each 0   escitalopram (LEXAPRO) 10 MG tablet TAKE 1 TABLET BY MOUTH DAILY FOR ANXIETY 90 tablet 2   glipiZIDE (GLUCOTROL XL) 5 MG 24 hr tablet Take 1 tablet (5 mg total) by mouth daily with breakfast. for diabetes. 90 tablet 0   glucose blood test strip Use as instructed to check blood glucose levels 3-4 times daily. 300 each 5   Insulin Glargine (BASAGLAR KWIKPEN) 100 UNIT/ML Inject 25 Units into the skin daily. For diabetes. 30 mL 0   Insulin Pen Needle (PEN NEEDLES) 31G X 6 MM MISC Use nightly with insulin. 100 each 1   lamoTRIgine (LAMICTAL) 100 MG tablet Take 1 tablet (100 mg total) by mouth 2 (two) times daily. 180 tablet 3   medroxyPROGESTERone (DEPO-PROVERA) 150  MG/ML injection ADMINISTER 1 ML IN THE MUSCLE EVERY 11 TO 12 WEEKS     metFORMIN (GLUCOPHAGE-XR) 500 MG 24 hr tablet Take 2 tablets (1,000 mg total) by mouth daily with breakfast AND 1 tablet (500 mg total) every evening. for diabetes.. 270 tablet 1   Multiple Vitamin (MULTIVITAMIN ADULT PO) Take by mouth.     OneTouch Delica Lancets 30G MISC Use up to 4 times daily for blood sugar checks 400 each 1   [DISCONTINUED] levETIRAcetam (KEPPRA) 500 MG tablet Take 1 tablet (500 mg total) by mouth daily. 14 tablet 0   No current facility-administered medications on file prior to visit.     ROS see history of present illness  Objective  Physical Exam Vitals:   02/13/23 1350  BP: 128/78  Pulse: (!) 110  Temp: 98.3 F (36.8 C)  SpO2: 99%    BP Readings from Last 3 Encounters:  02/13/23 128/78  01/23/23 138/88  10/01/22 (!) 130/91   Wt Readings from Last 3 Encounters:  02/13/23 165 lb 6.4 oz (75 kg)  01/23/23 167 lb 12.8 oz (76.1 kg)  10/01/22 168 lb (76.2 kg)    Physical Exam Constitutional:      General: She is not in acute distress.    Appearance: She is not diaphoretic.  Cardiovascular:     Rate and Rhythm: Regular rhythm. Tachycardia present.  Heart sounds: Normal heart sounds.  Pulmonary:     Effort: Pulmonary effort is normal.     Breath sounds: Normal breath sounds.  Skin:    General: Skin is warm and dry.  Neurological:     Mental Status: She is alert.     Comments: CN 3-12 intact, 5/5 strength in bilateral biceps, triceps, grip, quads, hamstrings, plantar and dorsiflexion, sensation to light touch intact in bilateral UE and LE      Assessment/Plan: Please see individual problem list.  Acute nonintractable headache, unspecified headache type Assessment & Plan: Suspect migraine given her description.  She reports no history of this though does have documented migraine in her chart.  Suspect exacerbation of a chronic issue.  She is neurologically intact.  We  will treat with Depo-Medrol 40 mg daily.  She will take Benadryl 25 mg for 1 dose when she gets home.  Extensive number Hulda Marin had with the patient regarding monitoring her sugars after the steroid injection.  Glucose in the office was 100.  She will monitor her sugars after getting the steroid injection.  If glucose is consistently elevated above 400 she will let us know right away.  Glucose checked today.  Discussed the need to follow-up with her PCP to consider possible preventative treatment for migraines.  Advised to seek medical attention if she develops worsening headache or if she develops any neurological symptoms.  Discussed seeking medical attention if her headache does not improve with this treatment.  Orders: -     POCT glucose (manual entry)  Type 2 diabetes mellitus with hyperglycemia, unspecified whether long term insulin use (HCC) -     POCT glucose (manual entry)     Return in about 1 week (around 02/20/2023) for With PCP for follow-up on headaches.   [name], MD [facility]

## 2023-03-26 ENCOUNTER — Ambulatory Visit: Payer: Commercial Managed Care - HMO | Admitting: Primary Care

## 2023-04-04 ENCOUNTER — Ambulatory Visit: Payer: Commercial Managed Care - HMO | Admitting: Primary Care

## 2023-04-05 ENCOUNTER — Ambulatory Visit: Payer: Commercial Managed Care - HMO | Admitting: Primary Care

## 2023-04-17 ENCOUNTER — Ambulatory Visit: Payer: Commercial Managed Care - HMO | Admitting: Primary Care

## 2023-04-26 ENCOUNTER — Ambulatory Visit: Payer: Commercial Managed Care - HMO | Admitting: Primary Care

## 2023-05-02 ENCOUNTER — Ambulatory Visit: Payer: Commercial Managed Care - HMO | Admitting: Primary Care

## 2023-05-02 ENCOUNTER — Other Ambulatory Visit: Payer: Self-pay

## 2023-05-02 DIAGNOSIS — I10 Essential (primary) hypertension: Secondary | ICD-10-CM

## 2023-05-03 MED ORDER — AMLODIPINE BESYLATE 10 MG PO TABS: 10.0000 mg | ORAL_TABLET | Freq: Every day | ORAL | 0 refills | Status: DC

## 2023-05-24 ENCOUNTER — Ambulatory Visit: Payer: Commercial Managed Care - HMO | Admitting: Primary Care

## 2023-05-27 ENCOUNTER — Encounter: Payer: Self-pay | Admitting: Primary Care

## 2023-06-03 ENCOUNTER — Other Ambulatory Visit: Payer: Self-pay

## 2023-06-03 DIAGNOSIS — E1165 Type 2 diabetes mellitus with hyperglycemia: Secondary | ICD-10-CM

## 2023-07-09 ENCOUNTER — Ambulatory Visit: Admitting: Primary Care

## 2023-08-02 ENCOUNTER — Ambulatory Visit: Admitting: Primary Care

## 2023-08-12 ENCOUNTER — Ambulatory Visit: Admitting: Primary Care

## 2023-08-20 ENCOUNTER — Ambulatory Visit: Admitting: Primary Care

## 2023-08-29 ENCOUNTER — Ambulatory Visit (INDEPENDENT_AMBULATORY_CARE_PROVIDER_SITE_OTHER): Admitting: Primary Care

## 2023-08-29 ENCOUNTER — Encounter: Payer: Self-pay | Admitting: Primary Care

## 2023-08-29 VITALS — BP 128/82 | HR 91 | Temp 96.9°F | Ht 66.0 in | Wt 169.0 lb

## 2023-08-29 DIAGNOSIS — E1165 Type 2 diabetes mellitus with hyperglycemia: Secondary | ICD-10-CM | POA: Diagnosis not present

## 2023-08-29 DIAGNOSIS — Z23 Encounter for immunization: Secondary | ICD-10-CM

## 2023-08-29 DIAGNOSIS — F32A Depression, unspecified: Secondary | ICD-10-CM

## 2023-08-29 DIAGNOSIS — F419 Anxiety disorder, unspecified: Secondary | ICD-10-CM

## 2023-08-29 DIAGNOSIS — I10 Essential (primary) hypertension: Secondary | ICD-10-CM | POA: Diagnosis not present

## 2023-08-29 DIAGNOSIS — E781 Pure hyperglyceridemia: Secondary | ICD-10-CM

## 2023-08-29 DIAGNOSIS — Z7984 Long term (current) use of oral hypoglycemic drugs: Secondary | ICD-10-CM | POA: Diagnosis not present

## 2023-08-29 LAB — POCT GLYCOSYLATED HEMOGLOBIN (HGB A1C): Hemoglobin A1C: 7.9 % — AB (ref 4.0–5.6)

## 2023-08-29 MED ORDER — LANCETS MISC. MISC: 1.0000 | Freq: Three times a day (TID) | 5 refills | Status: AC

## 2023-08-29 MED ORDER — BLOOD GLUCOSE MONITORING SUPPL DEVI: 1.0000 | Freq: Three times a day (TID) | 0 refills | Status: AC

## 2023-08-29 MED ORDER — LANCET DEVICE MISC: 1.0000 | Freq: Three times a day (TID) | 0 refills | Status: AC

## 2023-08-29 MED ORDER — BLOOD GLUCOSE TEST VI STRP: 1.0000 | ORAL_STRIP | Freq: Three times a day (TID) | 5 refills | Status: AC

## 2023-08-29 MED ORDER — GLIPIZIDE ER 5 MG PO TB24: 5.0000 mg | ORAL_TABLET | Freq: Every day | ORAL | 1 refills | Status: DC

## 2023-08-29 NOTE — Assessment & Plan Note (Signed)
Controlled.  Continue Lexapro 10 mg daily. 

## 2023-08-29 NOTE — Assessment & Plan Note (Signed)
 Continue fish oil daily. Repeat lipids in 3 months when she is fasting.

## 2023-08-29 NOTE — Patient Instructions (Signed)
Stop by the lab prior to leaving today. I will notify you of your results once received.   Please schedule a follow up visit for 3 months.  It was a pleasure to see you today!

## 2023-08-29 NOTE — Progress Notes (Signed)
 Subjective:    Patient ID: Melissa Chapman, female    DOB: 21-Sep-1985, 38 y.o.   MRN: [id number]  HPI  Melissa Chapman is a very pleasant 38 y.o. female with a history of type 2 diabetes, seizure disorder, anxiety depression, vitamin B12 deficiency, chronic anemia, chronic hypokalemia who presents today follow-up of chronic conditions.  1) Hypertension: Currently managed on amlodipine  10 mg daily.  BP Readings from Last 3 Encounters:  08/29/23 128/82  02/13/23 128/78  01/23/23 138/88     2) Anxiety and Depression: Currently managed on Lexapro  10 mg daily. Overall doing well on this regimen. Denies concerns today.   3) Type 2 Diabetes:  Current medications include: Metformin  ER 1000 mg in a.m. and 500 mg in p.m., glipizide  XL 5 mg daily, Basaglar  insulin 25 units daily  She ran out of Glipizide  1 month ago. She stopped her Basaglar  in December 2024.   She is checking her blood glucose 3 times weekly and is getting readings of:  Bedtime: 68-140 range. Mostly in the 130s. When she's in the 60s or 70s she feels clammy. Over the last month this has happened 3 times. Each time she feels that way she eats something sugary.  She admits to not eating dinner time due to her busy schedule.  Last A1C: 11.9 in October 2024, 7.9 today Last Eye Exam: Due Last Foot Exam: Due Pneumonia Vaccination: Never completed Urine Microalbumin: Due Statin: None.  Dietary changes since last visit: She has reduced intake of fried foods, increased veggies and salads.    Exercise: None     Review of Systems  Eyes:  Negative for visual disturbance.  Respiratory:  Negative for shortness of breath.   Cardiovascular:  Negative for chest pain.  Endocrine: Negative for polydipsia, polyphagia and polyuria.  Neurological:  Negative for dizziness, numbness and headaches.         Past Medical History:  Diagnosis Date   Anxiety and depression    Chronic hypokalemia    Essential hypertension     Pancreatitis    Seizures (HCC)    last seizure 09/02/20   Syncope    most recent March 2021   Viral gastroenteritis 06/20/2021    Social History   Socioeconomic History   Marital status: Single    Spouse name: Not on file   Number of children: 1   Years of education: Not on file   Highest education level: Bachelor's degree (e.g., BA, AB, BS)  Occupational History    Comment: RN [employer]  Tobacco Use   Smoking status: Every Day    Current packs/day: 1.00    Average packs/day: 1 pack/day for 13.0 years (13.0 ttl pk-yrs)    Types: Cigarettes   Smokeless tobacco: Never  Substance and Sexual Activity   Alcohol use: Yes    Comment: socially   Drug use: No   Sexual activity: Yes  Other Topics Concern   Not on file  Social History Narrative   Single.    1 son.   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   Social Drivers of Corporate investment banker Strain: Not on file  Food Insecurity: Not on file  Transportation Needs: Not on file  Physical Activity: Not on file  Stress: Not on file  Social Connections: Not on file  Intimate Partner Violence: Not on file    History reviewed. No pertinent surgical history.  Family History  Problem Relation Age of Onset  Depression Mother    Hypertension Mother    Hyperlipidemia Mother    Hypertension Father    Diabetes Father    Hyperlipidemia Father    Lung cancer Father        smoker   Depression Sister    Cancer Paternal Grandmother     No Known Allergies  Current Outpatient Medications on File Prior to Visit  Medication Sig Dispense Refill   amLODipine  (NORVASC ) 10 MG tablet Take 1 tablet (10 mg total) by mouth daily. for blood pressure 90 tablet 0   blood glucose meter kit and supplies KIT Dispense based on patient and insurance preference. Use up to four times daily as directed. (FOR ICD-9 250.00, 250.01). 1 each 0   escitalopram  (LEXAPRO ) 10 MG tablet TAKE 1 TABLET BY MOUTH DAILY FOR  ANXIETY 90 tablet 2   glucose blood test strip Use as instructed to check blood glucose levels 3-4 times daily. 300 each 5   Insulin Pen Needle (PEN NEEDLES) 31G X 6 MM MISC Use nightly with insulin. 100 each 1   lamoTRIgine  (LAMICTAL ) 100 MG tablet Take 1 tablet (100 mg total) by mouth 2 (two) times daily. 180 tablet 3   medroxyPROGESTERone (DEPO-PROVERA) 150 MG/ML injection ADMINISTER 1 ML IN THE MUSCLE EVERY 11 TO 12 WEEKS     metFORMIN  (GLUCOPHAGE -XR) 500 MG 24 hr tablet Take 2 tablets (1,000 mg total) by mouth daily with breakfast AND 1 tablet (500 mg total) every evening. for diabetes.. 270 tablet 1   Multiple Vitamin (MULTIVITAMIN ADULT PO) Take by mouth.     OneTouch Delica Lancets 30G MISC Use up to 4 times daily for blood sugar checks 400 each 1   [DISCONTINUED] levETIRAcetam  (KEPPRA ) 500 MG tablet Take 1 tablet (500 mg total) by mouth daily. 14 tablet 0   No current facility-administered medications on file prior to visit.    BP 128/82   Pulse 91   Temp (!) 96.9 F (36.1 C) (Temporal)   Ht 5\' 6"  (1.676 m)   Wt 169 lb (76.7 kg)   SpO2 98%   BMI 27.28 kg/m  Objective:   Physical Exam Cardiovascular:     Rate and Rhythm: Normal rate and regular rhythm.  Pulmonary:     Effort: Pulmonary effort is normal.     Breath sounds: Normal breath sounds.  Abdominal:     Palpations: Abdomen is soft.     Tenderness: There is no abdominal tenderness.  Musculoskeletal:     Cervical back: Neck supple.  Skin:    General: Skin is warm and dry.  Neurological:     Mental Status: She is alert and oriented to person, place, and time.  Psychiatric:        Mood and Affect: Mood normal.           Assessment & Plan:  Type 2 diabetes mellitus with hyperglycemia, without long-term current use of insulin (HCC) Assessment & Plan: Improved but not at goal with A1c of 7.9 today.  Also no follow-up since October 2024.  Resume glipizide  XL 5 mg daily.  We discussed to start eating dinner at  night which should consist of high-protein. Continue metformin  ER 1000 mg in a.m. and 500 mg in p.m. Remain off Basaglar  insulin.  Foot exam today Urine microalbumin done pending  Follow-up in 3 months  Orders: -     glipiZIDE  ER; Take 1 tablet (5 mg total) by mouth daily with breakfast. for diabetes.  Dispense: 90 tablet; Refill: 1 -  Microalbumin / creatinine urine ratio  Type 2 diabetes mellitus with hyperglycemia, unspecified whether long term insulin use (HCC) Assessment & Plan: Improved but not at goal with A1c of 7.9 today.  Also no follow-up since October 2024.  Resume glipizide  XL 5 mg daily.  We discussed to start eating dinner at night which should consist of high-protein. Continue metformin  ER 1000 mg in a.m. and 500 mg in p.m. Remain off Basaglar  insulin.  Foot exam today Urine microalbumin done pending  Follow-up in 3 months  Orders: -     POCT glycosylated hemoglobin (Hb A1C)  Essential hypertension Assessment & Plan: Controlled.  Continue amlodipine  10 mg daily.   Anxiety and depression Assessment & Plan: Controlled.  Continue Lexapro  10 mg daily.   Hypertriglyceridemia Assessment & Plan: Continue fish oil daily. Repeat lipids in 3 months when she is fasting.         Ifeanyi Mickelson K Frieda Arnall, NP

## 2023-08-29 NOTE — Assessment & Plan Note (Signed)
 Controlled.  Continue amlodipine 10 mg daily.

## 2023-08-29 NOTE — Assessment & Plan Note (Signed)
 Improved but not at goal with A1c of 7.9 today.  Also no follow-up since October 2024.  Resume glipizide  XL 5 mg daily.  We discussed to start eating dinner at night which should consist of high-protein. Continue metformin  ER 1000 mg in a.m. and 500 mg in p.m. Remain off Basaglar  insulin.  Foot exam today Urine microalbumin done pending  Follow-up in 3 months

## 2023-08-30 ENCOUNTER — Ambulatory Visit: Payer: Self-pay | Admitting: Primary Care

## 2023-08-30 LAB — MICROALBUMIN / CREATININE URINE RATIO
Creatinine,U: 247.2 mg/dL
Microalb Creat Ratio: 21.8 mg/g (ref 0.0–30.0)
Microalb, Ur: 5.4 mg/dL — ABNORMAL HIGH (ref 0.0–1.9)

## 2023-09-02 ENCOUNTER — Other Ambulatory Visit: Payer: Self-pay

## 2023-09-02 DIAGNOSIS — I10 Essential (primary) hypertension: Secondary | ICD-10-CM

## 2023-09-02 MED ORDER — AMLODIPINE BESYLATE 10 MG PO TABS
10.0000 mg | ORAL_TABLET | Freq: Every day | ORAL | 3 refills | Status: AC
Start: 2023-09-02 — End: ?

## 2023-09-25 ENCOUNTER — Telehealth: Payer: Self-pay

## 2023-09-25 NOTE — Telephone Encounter (Signed)
 Cld Pt to make her aware [facility] Provider Verification form is ready for pickup. No answer, LVM with info (per DPR) form will be at front desk.

## 2023-10-16 ENCOUNTER — Other Ambulatory Visit: Payer: Self-pay

## 2023-10-16 DIAGNOSIS — E1165 Type 2 diabetes mellitus with hyperglycemia: Secondary | ICD-10-CM

## 2023-10-17 MED ORDER — METFORMIN HCL ER 500 MG PO TB24: ORAL_TABLET | ORAL | 0 refills | Status: DC

## 2023-10-21 ENCOUNTER — Other Ambulatory Visit: Payer: Self-pay | Admitting: Primary Care

## 2023-10-21 DIAGNOSIS — E1165 Type 2 diabetes mellitus with hyperglycemia: Secondary | ICD-10-CM

## 2023-10-21 NOTE — Telephone Encounter (Signed)
 Please call patient:  According to the chart, a 6 month supply was sent to pharmacy on 08/29/23. Have her call the pharmacy for a refill

## 2023-10-21 NOTE — Telephone Encounter (Signed)
 Copied from CRM [id number]. Topic: Clinical - Medication Refill >> Oct 21, 2023  1:09 PM Leah C wrote: Medication: glipiZIDE  (GLUCOTROL  XL) 5 MG 24 hr tablet  Has the patient contacted their pharmacy? Pharmacy contacted clinic.  (Agent: If no, request that the patient contact the pharmacy for the refill. If patient does not wish to contact the pharmacy document the reason why and proceed with request.) (Agent: If yes, when and what did the pharmacy advise?)  This is the patient's preferred pharmacy:  [facility] STORE #[id number] [facility], [place] - [address] AT [facility] [address] [place] [address] Phone: [phone] Fax: [phone]   Is this the correct pharmacy for this prescription? Yes If no, delete pharmacy and type the correct one.   Has the prescription been filled recently? No  Is the patient out of the medication? Yes  Has the patient been seen for an appointment in the last year OR does the patient have an upcoming appointment? Yes  Can we respond through MyChart? Yes  Agent: Please be advised that Rx refills may take up to 3 business days. We ask that you follow-up with your pharmacy.

## 2023-10-22 NOTE — Telephone Encounter (Signed)
 Left voicemail advising patient per dpr of Kates message.

## 2023-11-29 ENCOUNTER — Ambulatory Visit: Admitting: Primary Care

## 2023-12-04 ENCOUNTER — Ambulatory Visit: Admitting: Primary Care

## 2023-12-04 ENCOUNTER — Encounter: Payer: Self-pay | Admitting: Primary Care

## 2023-12-04 VITALS — BP 134/84 | HR 98 | Temp 97.2°F | Ht 66.0 in | Wt 171.0 lb

## 2023-12-04 DIAGNOSIS — E1165 Type 2 diabetes mellitus with hyperglycemia: Secondary | ICD-10-CM

## 2023-12-04 DIAGNOSIS — Z7984 Long term (current) use of oral hypoglycemic drugs: Secondary | ICD-10-CM

## 2023-12-04 DIAGNOSIS — Z23 Encounter for immunization: Secondary | ICD-10-CM | POA: Diagnosis not present

## 2023-12-04 DIAGNOSIS — F32A Depression, unspecified: Secondary | ICD-10-CM | POA: Diagnosis not present

## 2023-12-04 DIAGNOSIS — F419 Anxiety disorder, unspecified: Secondary | ICD-10-CM | POA: Diagnosis not present

## 2023-12-04 LAB — POCT GLYCOSYLATED HEMOGLOBIN (HGB A1C): Hemoglobin A1C: 11.8 % — AB (ref 4.0–5.6)

## 2023-12-04 MED ORDER — ESCITALOPRAM OXALATE 10 MG PO TABS
ORAL_TABLET | ORAL | 2 refills | Status: AC
Start: 2023-12-04 — End: ?

## 2023-12-04 MED ORDER — FREESTYLE LIBRE 3 PLUS SENSOR MISC: 1 refills | Status: DC

## 2023-12-04 MED ORDER — METFORMIN HCL ER 500 MG PO TB24: 1000.0000 mg | ORAL_TABLET | Freq: Two times a day (BID) | ORAL | 0 refills | Status: DC

## 2023-12-04 MED ORDER — GLIPIZIDE ER 10 MG PO TB24: 10.0000 mg | ORAL_TABLET | Freq: Every day | ORAL | 0 refills | Status: DC

## 2023-12-04 NOTE — Progress Notes (Signed)
 Subjective:    Patient ID: Melissa Chapman, female    DOB: May 11, 1985, 38 y.o.   MRN: [id number]  Melissa Chapman is a very pleasant 38 y.o. female with a history of type 2 diabetes, hypertension, migraines, seizures, hyperlipidemia, anemia who presents today for follow-up of diabetes.  Current medications include: metformin  ER 1000 mg in AM and 500 mg in PM, glipizide  XL 5 mg daily.   She is checking her blood glucose 3 times daily and is getting readings of:  AM fasting: low 200s 1-2 hours after lunch: mid to high 200s 3 hours after dinner: low 300s  Last A1C: 7.9 in May 2025 11.8 today Last Eye Exam: UTD Last Foot Exam: UTD Pneumonia Vaccination: 2025 Urine Microalbumin: UTD Statin: none.  Diet currently consists of:  Breakfast: Eggs, bacon, biscuit  Lunch: Hamburger, grilled chicken sandwich, salad, veggies. Heavy portion sizes.  Dinner: Protein, veggies, tacos. Heavy portion sizes  Snacks: Popcorn, nuts, protein, veggies Desserts: Sugar free ice cream and candy - twice weekly  Beverages: Water, Zero Pepsi, zero sweet tea  Exercise: 3 times weekly for 1 hour, started 1 month ago   Wt Readings from Last 3 Encounters:  12/04/23 171 lb (77.6 kg)  08/29/23 169 lb (76.7 kg)  02/13/23 165 lb 6.4 oz (75 kg)     Review of Systems  Eyes:  Negative for visual disturbance.  Respiratory:  Negative for shortness of breath.   Cardiovascular:  Negative for chest pain.  Endocrine: Positive for polyphagia. Negative for polydipsia and polyuria.  Neurological:  Negative for numbness.         Past Medical History:  Diagnosis Date   Anxiety and depression    Chronic hypokalemia    Essential hypertension    Pancreatitis    Seizures (HCC)    last seizure 09/02/20   Syncope    most recent March 2021   Viral gastroenteritis 06/20/2021    Social History   Socioeconomic History   Marital status: Single    Spouse name: Not on file   Number of children: 1   Years of  education: Not on file   Highest education level: Bachelor's degree (e.g., BA, AB, BS)  Occupational History    Comment: RN [employer]  Tobacco Use   Smoking status: Every Day    Current packs/day: 1.00    Average packs/day: 1 pack/day for 13.0 years (13.0 ttl pk-yrs)    Types: Cigarettes   Smokeless tobacco: Never  Substance and Sexual Activity   Alcohol use: Yes    Comment: socially   Drug use: No   Sexual activity: Yes  Other Topics Concern   Not on file  Social History Narrative   Single.    1 son.   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   Social Drivers of Corporate investment banker Strain: Not on file  Food Insecurity: Not on file  Transportation Needs: Not on file  Physical Activity: Not on file  Stress: Not on file  Social Connections: Not on file  Intimate Partner Violence: Not on file    History reviewed. No pertinent surgical history.  Family History  Problem Relation Age of Onset   Depression Mother    Hypertension Mother    Hyperlipidemia Mother    Hypertension Father    Diabetes Father    Hyperlipidemia Father    Lung cancer Father        smoker   Depression Sister  Cancer Paternal Grandmother     No Known Allergies  Current Outpatient Medications on File Prior to Visit  Medication Sig Dispense Refill   amLODipine  (NORVASC ) 10 MG tablet Take 1 tablet (10 mg total) by mouth daily. for blood pressure 90 tablet 3   blood glucose meter kit and supplies KIT Dispense based on patient and insurance preference. Use up to four times daily as directed. (FOR ICD-9 250.00, 250.01). 1 each 0   Blood Glucose Monitoring Suppl DEVI 1 each by Does not apply route in the morning, at noon, and at bedtime. May substitute to any manufacturer covered by patient's insurance. 1 each 0   Glucose Blood (BLOOD GLUCOSE TEST STRIPS) STRP 1 each by In Vitro route in the morning, at noon, and at bedtime. May substitute to any manufacturer covered  by patient's insurance. 100 strip 5   Insulin Pen Needle (PEN NEEDLES) 31G X 6 MM MISC Use nightly with insulin. 100 each 1   lamoTRIgine  (LAMICTAL ) 100 MG tablet Take 1 tablet (100 mg total) by mouth 2 (two) times daily. 180 tablet 3   Lancets Misc. MISC 1 each by Does not apply route in the morning, at noon, and at bedtime. May substitute to any manufacturer covered by patient's insurance. 100 each 5   medroxyPROGESTERone (DEPO-PROVERA) 150 MG/ML injection ADMINISTER 1 ML IN THE MUSCLE EVERY 11 TO 12 WEEKS     Multiple Vitamin (MULTIVITAMIN ADULT PO) Take by mouth.     OneTouch Delica Lancets 30G MISC Use up to 4 times daily for blood sugar checks 400 each 1   [DISCONTINUED] levETIRAcetam  (KEPPRA ) 500 MG tablet Take 1 tablet (500 mg total) by mouth daily. 14 tablet 0   No current facility-administered medications on file prior to visit.    BP 134/84   Pulse 98   Temp (!) 97.2 F (36.2 C) (Temporal)   Ht 5' 6 (1.676 m)   Wt 171 lb (77.6 kg)   SpO2 99%   BMI 27.60 kg/m  Objective:   Physical Exam Cardiovascular:     Rate and Rhythm: Normal rate and regular rhythm.  Pulmonary:     Effort: Pulmonary effort is normal.     Breath sounds: Normal breath sounds.  Musculoskeletal:     Cervical back: Neck supple.  Skin:    General: Skin is warm and dry.  Neurological:     Mental Status: She is alert and oriented to person, place, and time.  Psychiatric:        Mood and Affect: Mood normal.     Physical Exam        Assessment & Plan:  Type 2 diabetes mellitus with hyperglycemia, without long-term current use of insulin (HCC) Assessment & Plan: Uncontrolled with A1c of 11.8 today!  Need to rule out type 1 diabetes given symptoms and lack of changes in diet. Labs pending today including C-peptide, isolate cell antibodies, ZnT8 antibodies.  Increase metformin  ER to 1000 mg twice daily. Increase glipizide  XL to 10 mg daily.  Consider resuming insulin. Consider GLP-1  agonist treatment but need to rule out type 1 diabetes.  Rx for freestyle libre 3+ sensor sent to pharmacy.  We discussed instructions for use, sample provided today.  Close follow-up in 3 months.  She will update regarding glucose readings in 2 weeks.  Await lab results.  Orders: -     POCT glycosylated hemoglobin (Hb A1C) -     FreeStyle Libre 3 Plus Sensor; Use to check blood sugar  continuously. Change sensor every 15 days.  Dispense: 6 each; Refill: 1 -     glipiZIDE  ER; Take 1 tablet (10 mg total) by mouth daily with breakfast. for diabetes.  Dispense: 90 tablet; Refill: 0 -     metFORMIN  HCl ER; Take 2 tablets (1,000 mg total) by mouth 2 (two) times daily with a meal. for diabetes.  Dispense: 360 tablet; Refill: 0 -     C-peptide -     ZNT8 Antibodies -     Glutamic acid decarboxylase auto abs  Encounter for immunization -     Flu vaccine trivalent PF, 6mos and older(Flulaval,Afluria,Fluarix,Fluzone)  Anxiety and depression -     Escitalopram  Oxalate; TAKE 1 TABLET BY MOUTH DAILY, FOR ANXIETY  Dispense: 90 tablet; Refill: 2    Assessment and Plan Assessment & Plan         Comer MARLA Gaskins, NP    Discussed the use of AI scribe software for clinical note transcription with the patient, who gave verbal consent to proceed.  History of Present Illness

## 2023-12-04 NOTE — Patient Instructions (Signed)
 Stop by the lab prior to leaving today. I will notify you of your results once received.   Increase metformin  for diabetes.  Take 2 pills by mouth twice daily.  We increased your glipizide  to XL 10 mg once daily for diabetes.  Use the freestyle libre 3+ to check your blood sugars continuously.  Please update me with glucose readings in 2 weeks.  Please schedule a follow up visit for 3 months.  It was a pleasure to see you today!

## 2023-12-04 NOTE — Addendum Note (Signed)
 Addended by: Syndi Pua K on: 12/04/2023 09:52 AM   Modules accepted: Level of Service

## 2023-12-04 NOTE — Assessment & Plan Note (Signed)
 Uncontrolled with A1c of 11.8 today!  Need to rule out type 1 diabetes given symptoms and lack of changes in diet. Labs pending today including C-peptide, isolate cell antibodies, ZnT8 antibodies.  Increase metformin  ER to 1000 mg twice daily. Increase glipizide  XL to 10 mg daily.  Consider resuming insulin. Consider GLP-1 agonist treatment but need to rule out type 1 diabetes.  Rx for freestyle libre 3+ sensor sent to pharmacy.  We discussed instructions for use, sample provided today.  Close follow-up in 3 months.  She will update regarding glucose readings in 2 weeks.  Await lab results.

## 2023-12-10 ENCOUNTER — Ambulatory Visit: Payer: Self-pay | Admitting: Primary Care

## 2023-12-10 DIAGNOSIS — E1165 Type 2 diabetes mellitus with hyperglycemia: Secondary | ICD-10-CM

## 2023-12-12 LAB — ZNT8 ANTIBODIES: ZNT8 Antibodies: <10 U/mL (ref <15)

## 2023-12-12 LAB — C-PEPTIDE: C-Peptide: 1.77 ng/mL (ref 0.80–3.85)

## 2023-12-12 LAB — GLUTAMIC ACID DECARBOXYLASE AUTO ABS: Glutamic Acid Decarb Ab: <5 [IU]/mL (ref <5)

## 2023-12-13 MED ORDER — BASAGLAR KWIKPEN 100 UNIT/ML ~~LOC~~ SOPN: 10.0000 [IU] | PEN_INJECTOR | Freq: Every day | SUBCUTANEOUS | 0 refills | Status: AC

## 2023-12-13 MED ORDER — PEN NEEDLES 31G X 6 MM MISC: 1 refills | Status: AC

## 2024-01-16 ENCOUNTER — Telehealth: Payer: Commercial Managed Care - HMO | Admitting: Adult Health

## 2024-01-16 ENCOUNTER — Encounter: Payer: Self-pay | Admitting: Adult Health

## 2024-01-16 DIAGNOSIS — G40909 Epilepsy, unspecified, not intractable, without status epilepticus: Secondary | ICD-10-CM | POA: Diagnosis not present

## 2024-01-16 MED ORDER — LAMOTRIGINE 100 MG PO TABS
100.0000 mg | ORAL_TABLET | Freq: Two times a day (BID) | ORAL | 3 refills | Status: AC
Start: 2024-01-16 — End: ?

## 2024-01-16 NOTE — Progress Notes (Signed)
 [facility]  PATIENT: Melissa Chapman DOB: 07-Jun-1985  REFERRING CLINICIAN: Gretta Comer POUR, NP HISTORY FROM: patient REASON FOR VISIT: follow up    Virtual Visit via Video Note  I connected with Melissa Chapman on 01/16/24 at  3:45 PM EDT by a video enabled telemedicine application and verified that I am speaking with the correct person using two identifiers.  Location: Patient: at work Provider: in office   I discussed the limitations of evaluation and management by telemedicine and the availability of in person appointments. The patient expressed understanding and agreed to proceed.    HISTORICAL   HISTORY OF PRESENT ILLNESS:   Update 01/16/2024 JM: Patient returns for yearly seizure follow-up via MyChart video visit.  Reports doing well over the past year without any recurrent seizure activity.  Compliant on lamotrigine , no side effects.  Routinely follows with PCP, has been working on getting diabetes under control, recent A1c 11.8.  Reports follow-up with PCP in the next 2 months.  No questions or concerns at this time.     Update 01/14/2023 JM: Patient returns via virtual visit for yearly seizure follow-up.  Reports doing well.  Denies any seizures.  Reports compliance with lamotrigine  100mg  BID, denies side effects.  No questions or concerns at this time.  UPDATE (01/16/22, VRP): Since last visit, doing well. Symptoms are stable. No seizures. Tolerating lamotrigine .SABRA    UPDATE (09/05/20, VRP): Since last visit, was doing well until last couple of weeks with severely increased stress and anxiety.  Patient was feeling more jittery and shaky and called our office on 09/01/2020.  The next day she was having memory lapse, confusion, and other symptoms that prompted her mother to go check on her.  Patient's mother witnessed a abnormal spell of loss of consciousness which lasted for few seconds.  They called 911 for evaluation and paramedics arrived on scene.   Patient returned to baseline fairly quickly and declined to go to the emergency room.  Since then patient continues to feel jittery and shaky.  UPDATE (04/05/20, VRP): Since last visit, had been intermittently taking lamotrigine  (due to agitation side effects). Then had breakthrough seizure in 02/28/20 (at home, in bathroom, tongue biting, shaking, spitting up, post-ictal confusion). Went to ER, and then started on LEV + lamotrigine . Now tolerating both meds.   PRIOR HPI: 38 year old female here for evaluation of seizure vs syncope.  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   04/12/2018 patient was at home, woke up in the emergency room.  Apparently she had passed out.  She was diagnosed with dehydration and syncope.  06/28/2019 patient had just arrived in [place] with her son, was at the hotel when she collapsed and had seizure-like activity.  Apparently she woke up and was able to talk to the staff but then had a second event.  Patient does not remember waking up in between the 2 events.  She was taken to local hospital and had CT and EEG.  She does not have these results.  She does not know what the conclusion of her work-up was.  Patient was able to come back to the [place]  safely.  Patient denies any excessive alcohol use recently.  She states she drinks about 3 drinks per week.  She did not have any alcohol on 06/28/2019.  Patient does endorse chronic sleep deprivation and irregular sleep pattern.  She has history of anxiety.  She has been on bupropion  for 2 years.  This  was increased in dosage in May 2020 after her father passed away.  She was previously on Zoloft.  Patient has had issues with chronic nausea, hypokalemia, abdominal pain in the past.  She also had pancreatitis x2 in 2017, 2018, possibly related to excessive alcohol use at that time.  No family history of seizure.  Patient lives at home with her 60-year-old son.  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  She is not currently working.    REVIEW OF SYSTEMS: Full 14 system review of systems performed and negative with exception of: as per HPI.  ALLERGIES: No Known Allergies  HOME MEDICATIONS: Outpatient Medications Prior to Visit  Medication Sig Dispense Refill   amLODipine  (NORVASC ) 10 MG tablet Take 1 tablet (10 mg total) by mouth daily. for blood pressure 90 tablet 3   blood glucose meter kit and supplies KIT Dispense based on patient and insurance preference. Use up to four times daily as directed. (FOR ICD-9 250.00, 250.01). 1 each 0   Blood Glucose Monitoring Suppl DEVI 1 each by Does not apply route in the morning, at noon, and at bedtime. May substitute to any manufacturer covered by patient's insurance. 1 each 0   Continuous Glucose Sensor (FREESTYLE LIBRE 3 PLUS SENSOR) MISC Use to check blood sugar continuously. Change sensor every 15 days. 6 each 1   escitalopram  (LEXAPRO ) 10 MG tablet TAKE 1 TABLET BY MOUTH DAILY, FOR ANXIETY 90 tablet 2   glipiZIDE  (GLUCOTROL  XL) 10 MG 24 hr tablet Take 1 tablet (10 mg total) by mouth daily with breakfast. for diabetes. 90 tablet 0   Glucose Blood (BLOOD GLUCOSE TEST STRIPS) STRP 1 each by In Vitro route in the morning, at noon, and at bedtime. May substitute to any manufacturer covered by patient's insurance. 100 strip 5   Insulin Glargine  (BASAGLAR  KWIKPEN) 100 UNIT/ML Inject 10 Units into the skin daily. for diabetes. 15 mL 0   Insulin Pen Needle (PEN NEEDLES) 31G X 6 MM MISC Use nightly with insulin. 100 each 1   Lancets Misc. MISC 1 each by Does not apply route in the morning, at noon, and at bedtime. May substitute to any manufacturer covered by patient's insurance. 100 each 5   medroxyPROGESTERone (DEPO-PROVERA) 150 MG/ML injection ADMINISTER 1 ML IN THE MUSCLE EVERY 11 TO 12 WEEKS     metFORMIN  (GLUCOPHAGE -XR) 500 MG 24 hr tablet Take 2 tablets (1,000 mg total) by  mouth 2 (two) times daily with a meal. for diabetes. 360 tablet 0   Multiple Vitamin (MULTIVITAMIN ADULT PO) Take by mouth.     OneTouch Delica Lancets 30G MISC Use up to 4 times daily for blood sugar checks 400 each 1   lamoTRIgine  (LAMICTAL ) 100 MG tablet Take 1 tablet (100 mg total) by mouth 2 (two) times daily. 180 tablet 3   No facility-administered medications prior to visit.    PAST MEDICAL HISTORY: Past Medical History:  Diagnosis Date   Anxiety and depression    Chronic hypokalemia    Essential hypertension    Pancreatitis    Seizures (HCC)    last seizure 09/02/20   Syncope    most recent March 2021   Viral gastroenteritis 06/20/2021    PAST SURGICAL HISTORY: No past surgical history on file.  FAMILY HISTORY: Family History  Problem Relation Age of Onset   Depression Mother    Hypertension Mother    Hyperlipidemia Mother  Hypertension Father    Diabetes Father    Hyperlipidemia Father    Lung cancer Father        smoker   Depression Sister    Cancer Paternal Grandmother     SOCIAL HISTORY: Social History   Socioeconomic History   Marital status: Single    Spouse name: Not on file   Number of children: 1   Years of education: Not on file   Highest education level: Bachelor's degree (e.g., BA, AB, BS)  Occupational History    Comment: RN [employer]  Tobacco Use   Smoking status: Every Day    Current packs/day: 1.00    Average packs/day: 1 pack/day for 13.0 years (13.0 ttl pk-yrs)    Types: Cigarettes   Smokeless tobacco: Never  Substance and Sexual Activity   Alcohol use: Yes    Comment: socially   Drug use: No   Sexual activity: Yes  Other Topics Concern   Not on file  Social History Narrative   Single.    1 son.   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   Social Drivers of Corporate investment banker Strain: Not on file  Food Insecurity: Not on file  Transportation Needs: Not on file  Physical Activity: Not  on file  Stress: Not on file  Social Connections: Not on file  Intimate Partner Violence: Not on file     PHYSICAL EXAM N/a d/t visit type   DIAGNOSTIC DATA (LABS, IMAGING, TESTING) - I reviewed patient records, labs, notes, testing and imaging myself where available.  Lab Results  Component Value Date   WBC 6.3 09/24/2022   HGB 13.2 09/24/2022   HCT 39.7 09/24/2022   MCV 105.8 (H) 09/24/2022   PLT 185.0 09/24/2022      Component Value Date/Time   NA 135 09/24/2022 1427   NA 133 (A) 02/26/2022 0000   NA 138 12/13/2013 0445   K 3.3 (L) 09/24/2022 1427   K 3.2 (L) 12/13/2013 0445   CL 100 09/24/2022 1427   CL 105 12/13/2013 0445   CO2 16 (L) 09/24/2022 1427   CO2 26 12/13/2013 0445   GLUCOSE 178 (H) 09/24/2022 1427   GLUCOSE 75 12/13/2013 0445   BUN 8 09/24/2022 1427   BUN 14 02/26/2022 0000   BUN 4 (L) 12/13/2013 0445   CREATININE 0.85 09/24/2022 1427   CREATININE 0.83 01/12/2022 1519   CREATININE 0.84 12/13/2013 0445   CALCIUM 9.5 09/24/2022 1427   CALCIUM 7.3 (L) 12/13/2013 0445   PROT 8.1 01/12/2022 1519   PROT 7.9 09/05/2020 1648   PROT 7.6 12/11/2013 2310   ALBUMIN 4.6 02/26/2022 0000   ALBUMIN 4.9 (H) 09/05/2020 1648   ALBUMIN 3.8 12/11/2013 2310   AST 10 (L) 01/12/2022 1519   ALT 6 01/12/2022 1519   ALT 23 12/11/2013 2310   ALKPHOS 135 (H) 01/12/2022 1519   ALKPHOS 65 12/11/2013 2310   BILITOT 0.3 01/12/2022 1519   GFRNONAA >60 01/12/2022 1519   GFRNONAA >60 12/13/2013 0445   GFRAA >60 06/18/2018 1858   GFRAA >60 12/13/2013 0445   Lab Results  Component Value Date   CHOL 218 (H) 01/23/2023   HDL 36.20 (L) 01/23/2023   LDLCALC 108 (H) 01/23/2023   LDLDIRECT 128.0 09/24/2022   TRIG 367.0 (H) 01/23/2023   CHOLHDL 6 01/23/2023   Lab Results  Component Value Date   HGBA1C 11.8 (A) 12/04/2023   Lab Results  Component Value  Date   VITAMINB12 493 01/23/2023   Lab Results  Component Value Date   TSH 1.04 01/23/2023    09/07/19 EEG  -  Normal EEG in the awake and drowsy states.  09/14/20 EEG - Normal     ASSESSMENT AND PLAN  38 y.o. year old female here with:  Dx:  1. Seizure disorder [facility])       PLAN:  SEIZURE DISORDER --> last events 04/12/18, 06/28/19 x 2, 02/28/20, 09/05/20 - continue lamotrigine  100mg  twice a day for seizure prevention - refill provided - request CMP be completed at f/u visit with PCP -Advised to call with any breakthrough seizure activity -Avoid seizure provoking triggers and activities     Meds ordered this encounter  Medications   lamoTRIgine  (LAMICTAL ) 100 MG tablet    Sig: Take 1 tablet (100 mg total) by mouth 2 (two) times daily.    Dispense:  180 tablet    Refill:  3   Return in about 1 year (around 01/15/2025).     Harlene Bogaert, AGNP-BC  [facility] [address] [place], [place] [address]  Phone [phone] Fax [phone] Note: This document was prepared with digital dictation and possible smart phrase technology. Any transcriptional errors that result from this process are unintentional.

## 2024-02-13 ENCOUNTER — Other Ambulatory Visit: Payer: Self-pay | Admitting: Primary Care

## 2024-02-13 DIAGNOSIS — E1165 Type 2 diabetes mellitus with hyperglycemia: Secondary | ICD-10-CM

## 2024-02-24 ENCOUNTER — Telehealth: Payer: Self-pay

## 2024-02-24 NOTE — Telephone Encounter (Signed)
 Copied from CRM #[id number]. Topic: Clinical - Medication Question >> Feb 24, 2024  9:15 AM Fonda T wrote: Reason for CRM: Pt calling to inquire if office has a sample of Freestyle Libre 3 Plus sensor in office she can have.  Pt reports her sensor fell off, and insurance will not cover another sensor until next week.  Pt can be reached back at [phone].  Pt is aware of same day call back.

## 2024-02-25 NOTE — Telephone Encounter (Signed)
 Called patient and let know that we don't have any samples she will reach out to manufacture to see if they will replace.

## 2024-03-19 ENCOUNTER — Encounter: Payer: Self-pay | Admitting: Primary Care

## 2024-03-19 ENCOUNTER — Ambulatory Visit: Payer: Self-pay | Admitting: Primary Care

## 2024-03-19 ENCOUNTER — Ambulatory Visit: Admitting: Primary Care

## 2024-03-19 VITALS — BP 122/76 | HR 95 | Temp 98.4°F | Ht 66.0 in | Wt 183.1 lb

## 2024-03-19 DIAGNOSIS — Z7985 Long-term (current) use of injectable non-insulin antidiabetic drugs: Secondary | ICD-10-CM | POA: Diagnosis not present

## 2024-03-19 DIAGNOSIS — E1165 Type 2 diabetes mellitus with hyperglycemia: Secondary | ICD-10-CM

## 2024-03-19 DIAGNOSIS — Z7984 Long term (current) use of oral hypoglycemic drugs: Secondary | ICD-10-CM

## 2024-03-19 LAB — POCT GLYCOSYLATED HEMOGLOBIN (HGB A1C): Hemoglobin A1C: 9.1 % — AB (ref 4.0–5.6)

## 2024-03-19 MED ORDER — GLIPIZIDE ER 10 MG PO TB24: 10.0000 mg | ORAL_TABLET | Freq: Every day | ORAL | 1 refills | Status: AC

## 2024-03-19 MED ORDER — METFORMIN HCL ER 500 MG PO TB24: 1000.0000 mg | ORAL_TABLET | Freq: Two times a day (BID) | ORAL | 1 refills | Status: AC

## 2024-03-19 MED ORDER — MOUNJARO 2.5 MG/0.5ML ~~LOC~~ SOAJ: 2.5000 mg | SUBCUTANEOUS | 0 refills | Status: DC

## 2024-03-19 NOTE — Patient Instructions (Addendum)
 Start tirzepitide (Mounjaro ) for diabetes/weight loss. Start by injecting 2.5 mg into the skin once weekly for 4 weeks, then increase to 5 mg once weekly thereafter. Please notify me once you've used your last 2.5 mg pen so that I can prescribe the next dose.   Increase your Basaglar  insulin to 20-25 units for now. Monitor your blood sugars closely and titrate down as needed.   Continue Glipizide  and Metformin .   Please schedule a follow up visit for 3 months.   It was a pleasure to see you today!

## 2024-03-19 NOTE — Progress Notes (Signed)
 Subjective:    Patient ID: Melissa Chapman, female    DOB: 1986/01/26, 38 y.o.   MRN: [id number]  Melissa Chapman is a very pleasant 38 y.o. female with a history of type 2 diabetes, seizure disorder, hypertension, migraines, chronic anemia who presents today for follow-up of diabetes.  1) Type 2 Diabetes:  Current medications include: Glipizide  XL 10 mg daily, metformin  ER 2000 mg twice daily, Basaglar  15 units daily.  She has not had her glipizide  for about 1 week.  She is checking her blood glucose continuously with CGM and is getting readings of low 100s mostly.   Time in target over last 7 days: 8% Over last 14 days: 18% Over last 30 days: 35%  Last A1C: 11.8 in September 2025, 9.1 today Last Eye Exam: Due Last Foot Exam: Up-to-date Pneumonia Vaccination: 2025 Urine Microalbumin: Up-to-date Statin: None  BP Readings from Last 3 Encounters:  03/19/24 122/76  12/04/23 134/84  08/29/23 128/82      Review of Systems  Respiratory:  Negative for shortness of breath.   Cardiovascular:  Negative for chest pain.  Endocrine: Positive for polyphagia. Negative for polydipsia and polyuria.  Neurological:  Negative for dizziness and numbness.         Past Medical History:  Diagnosis Date   Anxiety and depression    Chronic hypokalemia    Essential hypertension    Pancreatitis    Seizures (HCC)    last seizure 09/02/20   Syncope    most recent March 2021   Viral gastroenteritis 06/20/2021    Social History   Socioeconomic History   Marital status: Single    Spouse name: Not on file   Number of children: 1   Years of education: Not on file   Highest education level: Bachelor's degree (e.g., BA, AB, BS)  Occupational History    Comment: RN [employer]  Tobacco Use   Smoking status: Every Day    Current packs/day: 1.00    Average packs/day: 1 pack/day for 13.0 years (13.0 ttl pk-yrs)    Types: Cigarettes   Smokeless tobacco: Never  Substance and Sexual Activity    Alcohol use: Yes    Comment: socially   Drug use: No   Sexual activity: Yes  Other Topics Concern   Not on file  Social History Narrative   Single.    1 son.   Works as a Engineer, Civil (consulting) in [employer].    Enjoys watching movies, swimming.   Social Drivers of Health   Tobacco Use: High Risk (03/19/2024)   Patient History    Smoking Tobacco Use: Every Day    Smokeless Tobacco Use: Never    Passive Exposure: Not on file  Financial Resource Strain: Not on file  Food Insecurity: Not on file  Transportation Needs: Not on file  Physical Activity: Not on file  Stress: Not on file  Social Connections: Not on file  Intimate Partner Violence: Not on file  Depression (PHQ2-9): Low Risk (03/19/2024)   Depression (PHQ2-9)    PHQ-2 Score: 0  Alcohol Screen: Not on file  Housing: Not on file  Utilities: Not on file  Health Literacy: Not on file    History reviewed. No pertinent surgical history.  Family History  Problem Relation Age of Onset   Depression Mother    Hypertension Mother    Hyperlipidemia Mother    Hypertension Father    Diabetes Father    Hyperlipidemia Father    Lung cancer Father  smoker   Depression Sister    Cancer Paternal Grandmother     Allergies[1]  Medications Ordered Prior to Encounter[2]  BP 122/76   Pulse 95   Temp 98.4 F (36.9 C) (Oral)   Ht 5' 6 (1.676 m)   Wt 183 lb 2 oz (83.1 kg)   SpO2 97%   BMI 29.56 kg/m  Objective:   Physical Exam Cardiovascular:     Rate and Rhythm: Normal rate and regular rhythm.  Pulmonary:     Effort: Pulmonary effort is normal.     Breath sounds: Normal breath sounds.  Musculoskeletal:     Cervical back: Neck supple.  Skin:    General: Skin is warm and dry.  Neurological:     Mental Status: She is alert and oriented to person, place, and time.  Psychiatric:        Mood and Affect: Mood normal.     Physical Exam        Assessment & Plan:  Type 2 diabetes mellitus with hyperglycemia, without  long-term current use of insulin (HCC) Assessment & Plan: Improved but remains above goal with A1c at 9.1.  Discussed options, would like to get her off insulin if possible.  Start tirzepitide (Mounjaro ) for diabetes/weight loss. Start by injecting 2.5 mg into the skin once weekly for 4 weeks, then increase to 5 mg once weekly thereafter.  We discussed potential side effects and instructions for administration  We discussed that Mounjaro  may require a prior authorization so temporarily we will increase her Basaglar  to 20-25 units daily.  She will adjust accordingly Continue glipizide  XL 10 mg daily, metformin  ER 1000 mg twice daily.  She will update glucose readings in a couple of weeks. Follow-up in 3 months.  Orders: -     POCT glycosylated hemoglobin (Hb A1C) -     glipiZIDE  ER; Take 1 tablet (10 mg total) by mouth daily with breakfast. for diabetes.  Dispense: 90 tablet; Refill: 1 -     metFORMIN  HCl ER; Take 2 tablets (1,000 mg total) by mouth 2 (two) times daily with a meal. for diabetes.  Dispense: 360 tablet; Refill: 1 -     Mounjaro ; Inject 2.5 mg into the skin once a week. for diabetes.  Dispense: 2 mL; Refill: 0    Assessment and Plan Assessment & Plan         Comer MARLA Gaskins, NP       [1] No Known Allergies [2]  Current Outpatient Medications on File Prior to Visit  Medication Sig Dispense Refill   amLODipine  (NORVASC ) 10 MG tablet Take 1 tablet (10 mg total) by mouth daily. for blood pressure 90 tablet 3   blood glucose meter kit and supplies KIT Dispense based on patient and insurance preference. Use up to four times daily as directed. (FOR ICD-9 250.00, 250.01). 1 each 0   Blood Glucose Monitoring Suppl DEVI 1 each by Does not apply route in the morning, at noon, and at bedtime. May substitute to any manufacturer covered by patient's insurance. 1 each 0   Continuous Glucose Sensor (FREESTYLE LIBRE 3 PLUS SENSOR) MISC CHANGE SENSOR EVERY 15 DAYS, ATTACH AS  DIRECTED 6 each 1   escitalopram  (LEXAPRO ) 10 MG tablet TAKE 1 TABLET BY MOUTH DAILY, FOR ANXIETY 90 tablet 2   Glucose Blood (BLOOD GLUCOSE TEST STRIPS) STRP 1 each by In Vitro route in the morning, at noon, and at bedtime. May substitute to any manufacturer covered by patient's insurance. 100 strip  5   Insulin Glargine  (BASAGLAR  KWIKPEN) 100 UNIT/ML Inject 10 Units into the skin daily. for diabetes. 15 mL 0   Insulin Pen Needle (PEN NEEDLES) 31G X 6 MM MISC Use nightly with insulin. 100 each 1   lamoTRIgine  (LAMICTAL ) 100 MG tablet Take 1 tablet (100 mg total) by mouth 2 (two) times daily. 180 tablet 3   Lancets Misc. MISC 1 each by Does not apply route in the morning, at noon, and at bedtime. May substitute to any manufacturer covered by patient's insurance. 100 each 5   levonorgestrel (KYLEENA) 19.5 MG IUD 1 each by Intrauterine route once.     Multiple Vitamin (MULTIVITAMIN ADULT PO) Take by mouth.     OneTouch Delica Lancets 30G MISC Use up to 4 times daily for blood sugar checks 400 each 1   [DISCONTINUED] levETIRAcetam  (KEPPRA ) 500 MG tablet Take 1 tablet (500 mg total) by mouth daily. 14 tablet 0   No current facility-administered medications on file prior to visit.

## 2024-03-19 NOTE — Assessment & Plan Note (Addendum)
 Improved but remains above goal with A1c at 9.1.  Discussed options, would like to get her off insulin if possible.  Start tirzepitide (Mounjaro ) for diabetes/weight loss. Start by injecting 2.5 mg into the skin once weekly for 4 weeks, then increase to 5 mg once weekly thereafter.  We discussed potential side effects and instructions for administration  We discussed that Mounjaro  may require a prior authorization so temporarily we will increase her Basaglar  to 20-25 units daily.  She will adjust accordingly Continue glipizide  XL 10 mg daily, metformin  ER 1000 mg twice daily.  She will update glucose readings in a couple of weeks. Follow-up in 3 months.

## 2024-04-01 DIAGNOSIS — E1165 Type 2 diabetes mellitus with hyperglycemia: Secondary | ICD-10-CM

## 2024-04-05 MED ORDER — MOUNJARO 5 MG/0.5ML ~~LOC~~ SOAJ: 5.0000 mg | SUBCUTANEOUS | 0 refills | Status: AC

## 2024-04-13 ENCOUNTER — Other Ambulatory Visit (HOSPITAL_COMMUNITY): Payer: Self-pay

## 2024-04-13 ENCOUNTER — Telehealth: Payer: Self-pay

## 2024-04-13 NOTE — Telephone Encounter (Signed)
 Pharmacy Patient Advocate Encounter   Received notification from [facility] KEY that prior authorization for Mounjaro  5 is required/requested.   Insurance verification completed.   The patient is insured through [facility].   Per test claim: PA required; PA submitted to above mentioned insurance via Latent Key/confirmation #/EOC [facility] Status is pending

## 2024-04-15 ENCOUNTER — Other Ambulatory Visit (HOSPITAL_COMMUNITY): Payer: Self-pay

## 2024-04-15 NOTE — Telephone Encounter (Signed)
 Pharmacy Patient Advocate Encounter  Received notification from [facility] that Prior Authorization for Mounjaro  5 has been APPROVED from 04/13/24 to 04/13/25. Ran test claim, Copay is $25.00. This test claim was processed through [facility]- copay amounts may vary at other pharmacies due to pharmacy/plan contracts, or as the patient moves through the different stages of their insurance plan.   PA #/Case ID/Reference #: # [id number]
